# Patient Record
Sex: Female | Born: 1940 | Race: Black or African American | Hispanic: No | Marital: Single | Smoking: Never smoker
Health system: Southern US, Community
[De-identification: ages and names within clinical notes are randomized; demographics above are authoritative.]

## PROBLEM LIST (undated history)

## (undated) DIAGNOSIS — I499 Cardiac arrhythmia, unspecified: Secondary | ICD-10-CM

## (undated) DIAGNOSIS — K219 Gastro-esophageal reflux disease without esophagitis: Secondary | ICD-10-CM

## (undated) DIAGNOSIS — M1990 Unspecified osteoarthritis, unspecified site: Secondary | ICD-10-CM

## (undated) DIAGNOSIS — C7951 Secondary malignant neoplasm of bone: Secondary | ICD-10-CM

## (undated) DIAGNOSIS — I10 Essential (primary) hypertension: Secondary | ICD-10-CM

## (undated) HISTORY — PX: JOINT REPLACEMENT: SHX530

## (undated) HISTORY — PX: ABDOMINAL HYSTERECTOMY: SHX81

---

## 2004-12-11 ENCOUNTER — Inpatient Hospital Stay: Payer: Self-pay | Admitting: Orthopaedic Surgery

## 2005-07-26 ENCOUNTER — Ambulatory Visit: Payer: Self-pay

## 2006-09-04 ENCOUNTER — Ambulatory Visit: Payer: Self-pay

## 2007-09-07 ENCOUNTER — Ambulatory Visit: Payer: Self-pay

## 2008-09-08 ENCOUNTER — Ambulatory Visit: Payer: Self-pay

## 2009-09-14 ENCOUNTER — Ambulatory Visit: Payer: Self-pay

## 2009-10-25 ENCOUNTER — Ambulatory Visit: Payer: Self-pay | Admitting: General Practice

## 2009-11-08 ENCOUNTER — Inpatient Hospital Stay: Payer: Self-pay | Admitting: General Practice

## 2010-10-02 ENCOUNTER — Ambulatory Visit: Payer: Self-pay

## 2011-01-01 ENCOUNTER — Ambulatory Visit: Payer: Self-pay | Admitting: Gastroenterology

## 2011-01-01 DIAGNOSIS — K5790 Diverticulosis of intestine, part unspecified, without perforation or abscess without bleeding: Secondary | ICD-10-CM | POA: Insufficient documentation

## 2011-01-01 DIAGNOSIS — K635 Polyp of colon: Secondary | ICD-10-CM | POA: Insufficient documentation

## 2011-01-01 DIAGNOSIS — K219 Gastro-esophageal reflux disease without esophagitis: Secondary | ICD-10-CM | POA: Insufficient documentation

## 2011-10-07 ENCOUNTER — Ambulatory Visit: Payer: Self-pay

## 2011-11-28 LAB — URINALYSIS, COMPLETE
Blood: NEGATIVE
Ketone: NEGATIVE
Nitrite: POSITIVE
Ph: 6 (ref 4.5–8.0)
Protein: NEGATIVE
Specific Gravity: 1.01 (ref 1.003–1.030)

## 2011-11-28 LAB — CBC
HCT: 35.1 % (ref 35.0–47.0)
HGB: 11.6 g/dL — ABNORMAL LOW (ref 12.0–16.0)
MCH: 30 pg (ref 26.0–34.0)
MCV: 90 fL (ref 80–100)
Platelet: 188 10*3/uL (ref 150–440)
RBC: 3.88 10*6/uL (ref 3.80–5.20)
WBC: 5.9 10*3/uL (ref 3.6–11.0)

## 2011-11-28 LAB — TROPONIN I: Troponin-I: <0.02 ng/mL

## 2011-11-28 LAB — COMPREHENSIVE METABOLIC PANEL
Albumin: 3.4 g/dL (ref 3.4–5.0)
Anion Gap: 10 (ref 7–16)
Calcium, Total: 8.9 mg/dL (ref 8.5–10.1)
Chloride: 105 mmol/L (ref 98–107)
Co2: 29 mmol/L (ref 21–32)
EGFR (African American): >60
Glucose: 93 mg/dL (ref 65–99)
Osmolality: 290 (ref 275–301)
Potassium: 3.5 mmol/L (ref 3.5–5.1)
SGOT(AST): 25 U/L (ref 15–37)
Sodium: 144 mmol/L (ref 136–145)

## 2011-11-28 LAB — CK TOTAL AND CKMB (NOT AT ARMC)
CK, Total: 199 U/L (ref 21–215)
CK-MB: 2.9 ng/mL (ref 0.5–3.6)

## 2011-11-29 ENCOUNTER — Inpatient Hospital Stay: Payer: Self-pay | Admitting: Student

## 2011-11-29 LAB — CK TOTAL AND CKMB (NOT AT ARMC)
CK, Total: 199 U/L (ref 21–215)
CK-MB: 2.1 ng/mL (ref 0.5–3.6)
CK-MB: 1.5 ng/mL (ref 0.5–3.6)

## 2011-11-30 ENCOUNTER — Inpatient Hospital Stay (HOSPITAL_COMMUNITY)
Admission: AD | Admit: 2011-11-30 | Discharge: 2011-12-05 | DRG: 471 | Disposition: A | Discharge status: Home Health | Payer: Medicare Other | Source: Other Acute Inpatient Hospital | Attending: Neurosurgery | Admitting: Neurosurgery

## 2011-11-30 ENCOUNTER — Encounter (HOSPITAL_COMMUNITY): Payer: Self-pay

## 2011-11-30 DIAGNOSIS — G8250 Quadriplegia, unspecified: Secondary | ICD-10-CM | POA: Diagnosis present

## 2011-11-30 DIAGNOSIS — E7800 Pure hypercholesterolemia, unspecified: Secondary | ICD-10-CM | POA: Diagnosis present

## 2011-11-30 DIAGNOSIS — I10 Essential (primary) hypertension: Secondary | ICD-10-CM | POA: Diagnosis present

## 2011-11-30 DIAGNOSIS — M5000 Cervical disc disorder with myelopathy, unspecified cervical region: Principal | ICD-10-CM | POA: Diagnosis present

## 2011-11-30 DIAGNOSIS — K219 Gastro-esophageal reflux disease without esophagitis: Secondary | ICD-10-CM | POA: Diagnosis present

## 2011-11-30 DIAGNOSIS — G959 Disease of spinal cord, unspecified: Secondary | ICD-10-CM | POA: Diagnosis present

## 2011-11-30 HISTORY — DX: Essential (primary) hypertension: I10

## 2011-11-30 HISTORY — DX: Gastro-esophageal reflux disease without esophagitis: K21.9

## 2011-11-30 HISTORY — DX: Cardiac arrhythmia, unspecified: I49.9

## 2011-11-30 HISTORY — DX: Unspecified osteoarthritis, unspecified site: M19.90

## 2011-11-30 LAB — CBC WITH DIFFERENTIAL/PLATELET
Basophil #: 0 10*3/uL (ref 0.0–0.1)
Basophil %: 0.3 %
Eosinophil #: 0.1 10*3/uL (ref 0.0–0.7)
HCT: 33 % — ABNORMAL LOW (ref 35.0–47.0)
HGB: 11 g/dL — ABNORMAL LOW (ref 12.0–16.0)
Lymphocyte #: 2.7 10*3/uL (ref 1.0–3.6)
Lymphocyte %: 60 %
MCHC: 33.2 g/dL (ref 32.0–36.0)
Monocyte #: 0.5 10*3/uL (ref 0.0–0.7)
Monocyte %: 10.8 %
Neutrophil #: 1.2 10*3/uL — ABNORMAL LOW (ref 1.4–6.5)
Neutrophil %: 27 %
WBC: 4.5 10*3/uL (ref 3.6–11.0)

## 2011-11-30 LAB — MAGNESIUM: Magnesium: 2.2 mg/dL

## 2011-11-30 LAB — LIPID PANEL
Cholesterol: 172 mg/dL (ref 0–200)
Ldl Cholesterol, Calc: 107 mg/dL — ABNORMAL HIGH (ref 0–100)
Triglycerides: 118 mg/dL (ref 0–200)
VLDL Cholesterol, Calc: 24 mg/dL (ref 5–40)

## 2011-11-30 LAB — PROTIME-INR: INR: 1

## 2011-11-30 LAB — BASIC METABOLIC PANEL
Anion Gap: 10 (ref 7–16)
BUN: 19 mg/dL — ABNORMAL HIGH (ref 7–18)
Calcium, Total: 8.6 mg/dL (ref 8.5–10.1)
EGFR (African American): >60
EGFR (Non-African Amer.): 59 — ABNORMAL LOW
Glucose: 102 mg/dL — ABNORMAL HIGH (ref 65–99)
Osmolality: 289 (ref 275–301)

## 2011-11-30 MED ORDER — SIMVASTATIN 40 MG PO TABS
40.0000 mg | ORAL_TABLET | Freq: Every evening | ORAL | Status: DC
  Administered 2011-12-01 – 2011-12-04 (×4): 40 mg via ORAL
  Filled 2011-11-30 (×6): qty 1

## 2011-11-30 MED ORDER — SODIUM CHLORIDE 0.9 % IJ SOLN
3.0000 mL | Freq: Two times a day (BID) | INTRAMUSCULAR | Status: DC
  Administered 2011-11-30 – 2011-12-04 (×5): 3 mL via INTRAVENOUS

## 2011-11-30 MED ORDER — DEXAMETHASONE SODIUM PHOSPHATE 4 MG/ML IJ SOLN
4.0000 mg | Freq: Three times a day (TID) | INTRAMUSCULAR | Status: DC
  Administered 2011-11-30 – 2011-12-02 (×7): 4 mg via INTRAVENOUS
  Filled 2011-11-30 (×9): qty 1

## 2011-11-30 MED ORDER — PANTOPRAZOLE SODIUM 40 MG PO TBEC
40.0000 mg | DELAYED_RELEASE_TABLET | Freq: Every day | ORAL | Status: DC
  Administered 2011-11-30 – 2011-12-02 (×3): 40 mg via ORAL
  Filled 2011-11-30 (×3): qty 1

## 2011-11-30 MED ORDER — DOCUSATE SODIUM 100 MG PO CAPS
100.0000 mg | ORAL_CAPSULE | Freq: Two times a day (BID) | ORAL | Status: DC
  Administered 2011-11-30 – 2011-12-02 (×4): 100 mg via ORAL
  Filled 2011-11-30 (×4): qty 1

## 2011-11-30 MED ORDER — SODIUM CHLORIDE 0.9 % IV SOLN: 250.0000 mL | INTRAVENOUS | Status: DC | PRN

## 2011-11-30 MED ORDER — BISACODYL 10 MG RE SUPP: 10.0000 mg | Freq: Every day | RECTAL | Status: DC | PRN

## 2011-11-30 MED ORDER — ALUM & MAG HYDROXIDE-SIMETH 200-200-20 MG/5ML PO SUSP: 30.0000 mL | Freq: Four times a day (QID) | ORAL | Status: DC | PRN

## 2011-11-30 MED ORDER — MAGNESIUM HYDROXIDE 400 MG/5ML PO SUSP: 30.0000 mL | Freq: Every day | ORAL | Status: DC | PRN

## 2011-11-30 MED ORDER — HYDROXYZINE HCL 50 MG/ML IM SOLN
50.0000 mg | INTRAMUSCULAR | Status: DC | PRN
  Filled 2011-11-30: qty 1

## 2011-11-30 MED ORDER — METOPROLOL TARTRATE 50 MG PO TABS
50.0000 mg | ORAL_TABLET | Freq: Two times a day (BID) | ORAL | Status: DC
  Administered 2011-11-30 – 2011-12-04 (×9): 50 mg via ORAL
  Filled 2011-11-30 (×11): qty 1

## 2011-11-30 MED ORDER — HYDROXYZINE HCL 25 MG PO TABS
25.0000 mg | ORAL_TABLET | ORAL | Status: DC | PRN
  Filled 2011-11-30: qty 2

## 2011-11-30 MED ORDER — SODIUM CHLORIDE 0.9 % IJ SOLN: 3.0000 mL | INTRAMUSCULAR | Status: DC | PRN

## 2011-11-30 MED ORDER — HYDROCHLOROTHIAZIDE 25 MG PO TABS
25.0000 mg | ORAL_TABLET | Freq: Every day | ORAL | Status: DC
  Administered 2011-12-01 – 2011-12-05 (×5): 25 mg via ORAL
  Filled 2011-11-30 (×6): qty 1

## 2011-11-30 NOTE — H&P (Signed)
Subjective: Patient is a 71 y.o. 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   Patient is transferred from [facility] where she was hospitalized 2 days ago because of progressive weakness of her upper and lower extremities. Transfer was requested by [facility], a hospitalist at [facility]. Her primary physician is Dr. Loma Sender in [place]. An extensive workup was performed during her 2 day hospitalization including CT of the head which was unremarkable, MRI of the brain which was unremarkable, carotid Doppler ultrasonography which showed no hemodynamically significant stenosis bilaterally and antegrade flow in the vertebral arteries, CT scan of the cervical spine which showed multilevel spondylosis and degenerative disc disease, but most significantly MRI of the cervical spine which showed multilevel spondylosis and degenerative disc disease seen at the C3-4, C4-5, C5-6, and C6-7 levels.  However the most significant findings are at the C3-4 level with there is a significant spondylitic disc herniation causing spinal cord compression, the spinal cord has significant increased signal within it. Because of these findings transfer for neurosurgical care was requested.  Patient describes a history with her first symptoms and difficulties about 2 months ago. She describes initially she had some stiffness and discomfort in her neck. She saw Dedra Skeens, the physician assistant at the orthopedic department at [facility]. She explains that he did x-rays and told her she had arthritis and prescribe some arthritis pills which she felt helped and the stiffness is improved.  She's had increased difficulties over the past 2 weeks. She describes weakness in her legs, that she can't walk well, and she's had a tendency to fall, and for her legs to  give away. However she notes that she's had no pain. She started though using a single-point cane again (which he had from having undergone previous knee replacements) to help her with stability and to reduce the risk of falling.  She went to see her primary physician Dr. Vear Clock on December 27 she returned again to him on January 3 at which time he sent her to the emergency room and she was admitted and underwent a workup as described above.  Past medical history: Patient reports a history of hypertension, hypercholesterolemia, gastroesophageal reflux disease (GERD) and tachycardia arrhythmia. She denies any history of myocardial infarction, cancer, stroke, peptic ulcer disease, diabetes mellitus, or lung disease.  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 Ernest Pine. Many years ago she underwent a hysterectomy.  Prescriptions prior to admission  Medication Sig Dispense Refill  . dexamethasone (DECADRON) 10 MG/ML injection Inject 10 mg into the vein every 6 (six) hours as needed. For inflammation.       . hydrochlorothiazide (HYDRODIURIL) 25 MG tablet Take 25 mg by mouth daily.        . meloxicam (MOBIC) 7.5 MG tablet Take 7.5 mg by mouth daily.        . metoprolol (LOPRESSOR) 50 MG tablet Take 50 mg by mouth 2 (two) times daily.        Marland Kitchen omeprazole (PRILOSEC) 20 MG capsule Take 20 mg by mouth daily.        . simvastatin (ZOCOR) 40 MG tablet Take 40 mg by mouth every evening.         Allergies  Allergen Reactions  . Tylenol (Acetaminophen) Other (See Comments)  Shakes.     History  Substance Use Topics  . Smoking status: Not on file  . Smokeless tobacco: Not on file  . Alcohol Use: Not on file    Family history: Patient's mother is 27 she has hypertension and hypercholesterolemia. Her father died in his 35s and they're unsure of the cause  Social history: Patient is retired she is  unmarried she has 2 sons one of whom, Trey Paula was present with his wife Britta Mccreedy. She doesn't smoke nor does she drink up to hold beverages.  Review of Systems: Patient denies any numbness or paresthesias she denies any bowel or bladder incontinence she denies any complaints regarding her breathing, any chest discomfort, any nausea vomiting diarrhea or constipation, or any urinary discomfort. Review of systems is essentially notable for those difficulties as described in history of present illness.   Objective: Vital signs in last 24 hours: Temp:  [98.4 F (36.9 C)] 98.4 F (36.9 C) (01/05 1600) Pulse Rate:  [69] 69  (01/05 1600) Resp:  [18] 18  (01/05 1600) BP: (147)/(74) 147/74 mmHg (01/05 1600) Weight:  [104.3 kg (229 lb 15 oz)] 229 lb 15 oz (104.3 kg) (01/05 1600)  EXAM: Patient is obese black female in no acute distress. Lungs are clear to auscultation she has symmetrical respiratory excursion. Heart has a regular rate and rhythm normal S1 and S2 there is no murmur. Abdomen is soft nontender nondistended there is no hepatosplenomegaly, bowel sounds are present in all 4 quadrants. Musculoskeletal exam shows no tenderness the patient over the cervical spinous processes or paracervical musculature, she is fairly good range of motion of the neck.  Neurological examination: Motor examination shows quadriparesis: Deltoids are 4+ bilaterally, biceps are 4 minus bilaterally, triceps are 4 bilaterally,Intrinsics are 4 bilaterally, grips are 4 bilaterally. Iliopsoas is 4 minus on the left and 3-4 minus on the right. Quadriceps, dorsiflexor, and plantar flexor are 5 bilaterally. Sensation is intact to pinprick to the upper and lower extremities. Reflexes: The left biceps and brachialis are minimal, the right biceps and brachialis are 1. The left triceps is trace, the right triceps is 1-2. The left quadriceps is 1-2, the right quadriceps is 2. The gastrocnemius are absent bilaterally. The toes are  upgoing bilaterally, right somewhat more vigorously than left. The patient's stance is widened and her gait is widened and wobbly.   Assessment/Plan: 71 year old woman with progressive cervical myelopathy with quadriparesis. 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   I spoke with the patient while she was accompanied by her mother, her son Trey Paula and her daughter-in-law Britta Mccreedy and her close friend Harriett Sine about the nature of her condition and her recommendations for treatment and care. I did to offer them a picture of the nature of her spinal cord compression.  We have discussed that left untreated this condition is very likely to progress with worsening paralysis.  I therefore favor surgical decompression via a single level C3-4 anterior cervical decompression and arthrodesis with allograft and cervical plating. I've discussed with the patient the nature of her condition, the nature the surgical procedure, the typical length of surgery, hospital stay, and overall recuperation. We discussed limitations postoperatively. I discussed risks of surgery including risks of infection, bleeding, possibly need for transfusion, the risk of nerve root dysfunction with pain, weakness, numbness, or paresthesias, the risk of  spinal cord dysfunction with paralysis of all 4 limbs and quadriplegia, and the risk of dural tear and CSF leakage and possible need for further surgery, the risk of esophageal dysfunction causing dysphagia and the risk of laryngeal dysfunction causing hoarseness of the voice, the risk of failure of the arthrodesis and the possible need for further surgery, and the risk of anesthetic complications including myocardial infarction, stroke, pneumonia, and death. We also discussed the need for postoperative  immobilization in a cervical collar. Understanding all this the patient does wish to proceed with surgery.  The patient was given Lovenox as well as Plavix while at a [facility] and therefore we will need to wait several days for the effects of those medications to clear before proceeding with surgery. However in the meantime we'll proceed with a preoperative workup including laboratories, cervical spine x-rays, EKG, and chest x-ray.  Hewitt Shorts, MD 11/30/2011 6:10 PM

## 2011-12-01 ENCOUNTER — Other Ambulatory Visit: Payer: Self-pay

## 2011-12-01 ENCOUNTER — Inpatient Hospital Stay (HOSPITAL_COMMUNITY): Payer: Medicare Other

## 2011-12-01 LAB — COMPREHENSIVE METABOLIC PANEL
ALT: 21 U/L (ref 0–35)
AST: 22 U/L (ref 0–37)
Albumin: 3.1 g/dL — ABNORMAL LOW (ref 3.5–5.2)
Alkaline Phosphatase: 76 U/L (ref 39–117)
BUN: 24 mg/dL — ABNORMAL HIGH (ref 6–23)
CO2: 25 mEq/L (ref 19–32)
Calcium: 9.6 mg/dL (ref 8.4–10.5)
Chloride: 104 mEq/L (ref 96–112)
Creatinine, Ser: 0.9 mg/dL (ref 0.50–1.10)
GFR calc Af Amer: 72 mL/min — ABNORMAL LOW (ref >90)
GFR calc non Af Amer: 62 mL/min — ABNORMAL LOW (ref >90)
Glucose, Bld: 138 mg/dL — ABNORMAL HIGH (ref 70–99)
Potassium: 3.5 mEq/L (ref 3.5–5.1)
Sodium: 141 mEq/L (ref 135–145)
Total Bilirubin: 0.2 mg/dL — ABNORMAL LOW (ref 0.3–1.2)
Total Protein: 6.9 g/dL (ref 6.0–8.3)

## 2011-12-01 LAB — CBC
HCT: 34.1 % — ABNORMAL LOW (ref 36.0–46.0)
Hemoglobin: 11.3 g/dL — ABNORMAL LOW (ref 12.0–15.0)
MCH: 29.4 pg (ref 26.0–34.0)
MCHC: 33.1 g/dL (ref 30.0–36.0)
MCV: 88.8 fL (ref 78.0–100.0)
Platelets: 213 10*3/uL (ref 150–400)
RBC: 3.84 MIL/uL — ABNORMAL LOW (ref 3.87–5.11)
RDW: 13.4 % (ref 11.5–15.5)
WBC: 4.4 10*3/uL (ref 4.0–10.5)

## 2011-12-01 LAB — PROTIME-INR
INR: 1 (ref 0.00–1.49)
Prothrombin Time: 13.4 seconds (ref 11.6–15.2)

## 2011-12-01 LAB — DIFFERENTIAL
Basophils Absolute: 0 10*3/uL (ref 0.0–0.1)
Basophils Relative: 0 % (ref 0–1)
Eosinophils Absolute: 0 10*3/uL (ref 0.0–0.7)
Eosinophils Relative: 0 % (ref 0–5)
Lymphocytes Relative: 34 % (ref 12–46)
Lymphs Abs: 1.5 10*3/uL (ref 0.7–4.0)
Monocytes Absolute: 0.2 10*3/uL (ref 0.1–1.0)
Monocytes Relative: 5 % (ref 3–12)
Neutro Abs: 2.7 10*3/uL (ref 1.7–7.7)
Neutrophils Relative %: 62 % (ref 43–77)

## 2011-12-01 LAB — APTT: aPTT: 26 seconds (ref 24–37)

## 2011-12-02 ENCOUNTER — Encounter (HOSPITAL_COMMUNITY)
Admission: AD | Disposition: A | Discharge status: Home Health | Payer: Self-pay | Source: Other Acute Inpatient Hospital | Attending: Neurosurgery

## 2011-12-02 ENCOUNTER — Inpatient Hospital Stay (HOSPITAL_COMMUNITY): Payer: Medicare Other | Admitting: Anesthesiology

## 2011-12-02 ENCOUNTER — Encounter (HOSPITAL_COMMUNITY): Payer: Self-pay | Admitting: Anesthesiology

## 2011-12-02 ENCOUNTER — Inpatient Hospital Stay (HOSPITAL_COMMUNITY): Payer: Medicare Other

## 2011-12-02 HISTORY — PX: ANTERIOR CERVICAL DECOMP/DISCECTOMY FUSION: SHX1161

## 2011-12-02 SURGERY — ANTERIOR CERVICAL DECOMPRESSION/DISCECTOMY FUSION 1 LEVEL
Anesthesia: General | Site: Neck | Wound class: Clean

## 2011-12-02 MED ORDER — HYDROXYZINE HCL 50 MG/ML IM SOLN
50.0000 mg | INTRAMUSCULAR | Status: DC | PRN
  Filled 2011-12-02: qty 1

## 2011-12-02 MED ORDER — SODIUM CHLORIDE 0.9 % IR SOLN
Status: DC | PRN
  Administered 2011-12-02: 15:00:00

## 2011-12-02 MED ORDER — ONDANSETRON HCL 4 MG/2ML IJ SOLN
INTRAMUSCULAR | Status: DC | PRN
  Administered 2011-12-02: 4 mg via INTRAVENOUS

## 2011-12-02 MED ORDER — CEFAZOLIN SODIUM-DEXTROSE 2-3 GM-% IV SOLR
INTRAVENOUS | Status: AC
  Administered 2011-12-02: 2 g via INTRAVENOUS
  Filled 2011-12-02: qty 50

## 2011-12-02 MED ORDER — DEXAMETHASONE 4 MG PO TABS
4.0000 mg | ORAL_TABLET | Freq: Four times a day (QID) | ORAL | Status: DC
  Administered 2011-12-02 – 2011-12-03 (×2): 4 mg via ORAL
  Filled 2011-12-02 (×7): qty 1

## 2011-12-02 MED ORDER — ROCURONIUM BROMIDE 100 MG/10ML IV SOLN
INTRAVENOUS | Status: DC | PRN
  Administered 2011-12-02: 50 mg via INTRAVENOUS

## 2011-12-02 MED ORDER — OXYCODONE HCL 5 MG PO TABS
5.0000 mg | ORAL_TABLET | ORAL | Status: DC | PRN
  Administered 2011-12-03 – 2011-12-04 (×4): 10 mg via ORAL
  Filled 2011-12-02 (×4): qty 2

## 2011-12-02 MED ORDER — DOCUSATE SODIUM 100 MG PO CAPS
100.0000 mg | ORAL_CAPSULE | Freq: Two times a day (BID) | ORAL | Status: DC
  Administered 2011-12-02 – 2011-12-05 (×6): 100 mg via ORAL
  Filled 2011-12-02 (×5): qty 1

## 2011-12-02 MED ORDER — GLYCOPYRROLATE 0.2 MG/ML IJ SOLN
INTRAMUSCULAR | Status: DC | PRN
  Administered 2011-12-02: .6 mg via INTRAVENOUS

## 2011-12-02 MED ORDER — PROPOFOL 10 MG/ML IV EMUL
INTRAVENOUS | Status: DC | PRN
  Administered 2011-12-02: 200 mg via INTRAVENOUS

## 2011-12-02 MED ORDER — DEXAMETHASONE SODIUM PHOSPHATE 4 MG/ML IJ SOLN
4.0000 mg | Freq: Four times a day (QID) | INTRAMUSCULAR | Status: DC
  Administered 2011-12-02: 4 mg via INTRAVENOUS
  Filled 2011-12-02 (×3): qty 1

## 2011-12-02 MED ORDER — KETOROLAC TROMETHAMINE 30 MG/ML IJ SOLN
15.0000 mg | Freq: Once | INTRAMUSCULAR | Status: AC
  Administered 2011-12-02: 15 mg via INTRAVENOUS

## 2011-12-02 MED ORDER — PANTOPRAZOLE SODIUM 40 MG IV SOLR
40.0000 mg | Freq: Every day | INTRAVENOUS | Status: DC
  Administered 2011-12-02: 40 mg via INTRAVENOUS
  Filled 2011-12-02 (×3): qty 40

## 2011-12-02 MED ORDER — MENTHOL 3 MG MT LOZG: 1.0000 | LOZENGE | OROMUCOSAL | Status: DC | PRN

## 2011-12-02 MED ORDER — CEFAZOLIN SODIUM-DEXTROSE 2-3 GM-% IV SOLR
2.0000 g | Freq: Once | INTRAVENOUS | Status: DC
  Filled 2011-12-02: qty 50

## 2011-12-02 MED ORDER — BUPIVACAINE HCL (PF) 0.25 % IJ SOLN
INTRAMUSCULAR | Status: DC | PRN
  Administered 2011-12-02: 9 mL

## 2011-12-02 MED ORDER — FENTANYL CITRATE 0.05 MG/ML IJ SOLN
INTRAMUSCULAR | Status: DC | PRN
  Administered 2011-12-02 (×2): 50 ug via INTRAVENOUS
  Administered 2011-12-02: 100 ug via INTRAVENOUS
  Administered 2011-12-02: 50 ug via INTRAVENOUS

## 2011-12-02 MED ORDER — ALUM & MAG HYDROXIDE-SIMETH 200-200-20 MG/5ML PO SUSP: 30.0000 mL | Freq: Four times a day (QID) | ORAL | Status: DC | PRN

## 2011-12-02 MED ORDER — HYDROXYZINE HCL 50 MG PO TABS
50.0000 mg | ORAL_TABLET | ORAL | Status: DC | PRN
  Filled 2011-12-02: qty 1

## 2011-12-02 MED ORDER — SODIUM CHLORIDE 0.9 % IV SOLN: 250.0000 mL | INTRAVENOUS | Status: DC

## 2011-12-02 MED ORDER — KCL IN DEXTROSE-NACL 40-5-0.45 MEQ/L-%-% IV SOLN
INTRAVENOUS | Status: DC
  Administered 2011-12-02 – 2011-12-03 (×2): via INTRAVENOUS
  Filled 2011-12-02 (×9): qty 1000

## 2011-12-02 MED ORDER — KETOROLAC TROMETHAMINE 30 MG/ML IJ SOLN
INTRAMUSCULAR | Status: AC
  Filled 2011-12-02: qty 1

## 2011-12-02 MED ORDER — CYCLOBENZAPRINE HCL 10 MG PO TABS: 10.0000 mg | ORAL_TABLET | Freq: Three times a day (TID) | ORAL | Status: DC | PRN

## 2011-12-02 MED ORDER — VECURONIUM BROMIDE 10 MG IV SOLR
INTRAVENOUS | Status: DC | PRN
  Administered 2011-12-02: 1 mg via INTRAVENOUS

## 2011-12-02 MED ORDER — 0.9 % SODIUM CHLORIDE (POUR BTL) OPTIME
TOPICAL | Status: DC | PRN
  Administered 2011-12-02: 1000 mL

## 2011-12-02 MED ORDER — MORPHINE SULFATE 4 MG/ML IJ SOLN: 4.0000 mg | INTRAMUSCULAR | Status: DC | PRN

## 2011-12-02 MED ORDER — KETOROLAC TROMETHAMINE 30 MG/ML IJ SOLN
15.0000 mg | Freq: Four times a day (QID) | INTRAMUSCULAR | Status: DC
  Administered 2011-12-02 – 2011-12-04 (×6): 15 mg via INTRAVENOUS
  Filled 2011-12-02 (×8): qty 1

## 2011-12-02 MED ORDER — THROMBIN 5000 UNITS EX SOLR
CUTANEOUS | Status: DC | PRN
  Administered 2011-12-02 (×2): 5000 [IU] via TOPICAL

## 2011-12-02 MED ORDER — SODIUM CHLORIDE 0.9 % IJ SOLN
3.0000 mL | Freq: Two times a day (BID) | INTRAMUSCULAR | Status: DC
  Administered 2011-12-03 – 2011-12-04 (×3): 3 mL via INTRAVENOUS

## 2011-12-02 MED ORDER — BISACODYL 10 MG RE SUPP: 10.0000 mg | Freq: Every day | RECTAL | Status: DC | PRN

## 2011-12-02 MED ORDER — BACITRACIN 50000 UNITS IM SOLR
INTRAMUSCULAR | Status: AC
  Filled 2011-12-02: qty 50000

## 2011-12-02 MED ORDER — ONDANSETRON HCL 4 MG/2ML IJ SOLN: 4.0000 mg | Freq: Four times a day (QID) | INTRAMUSCULAR | Status: DC | PRN

## 2011-12-02 MED ORDER — PHENOL 1.4 % MT LIQD: 1.0000 | OROMUCOSAL | Status: DC | PRN

## 2011-12-02 MED ORDER — LIDOCAINE-EPINEPHRINE 1 %-1:100000 IJ SOLN
INTRAMUSCULAR | Status: DC | PRN
  Administered 2011-12-02: 9 mL

## 2011-12-02 MED ORDER — SODIUM CHLORIDE 0.9 % IV SOLN
INTRAVENOUS | Status: AC
  Filled 2011-12-02: qty 500

## 2011-12-02 MED ORDER — SODIUM CHLORIDE 0.9 % IJ SOLN: 3.0000 mL | INTRAMUSCULAR | Status: DC | PRN

## 2011-12-02 MED ORDER — DEXAMETHASONE SODIUM PHOSPHATE 4 MG/ML IJ SOLN
INTRAMUSCULAR | Status: DC | PRN
  Administered 2011-12-02: 4 mg via INTRAVENOUS

## 2011-12-02 MED ORDER — ZOLPIDEM TARTRATE 5 MG PO TABS: 5.0000 mg | ORAL_TABLET | Freq: Every evening | ORAL | Status: DC | PRN

## 2011-12-02 MED ORDER — ACETAMINOPHEN 325 MG PO TABS: 650.0000 mg | ORAL_TABLET | ORAL | Status: DC | PRN

## 2011-12-02 MED ORDER — HYDROMORPHONE HCL PF 1 MG/ML IJ SOLN: 0.2500 mg | INTRAMUSCULAR | Status: DC | PRN

## 2011-12-02 MED ORDER — NEOSTIGMINE METHYLSULFATE 1 MG/ML IJ SOLN
INTRAMUSCULAR | Status: DC | PRN
  Administered 2011-12-02: 4 mg via INTRAVENOUS

## 2011-12-02 MED ORDER — ACETAMINOPHEN 650 MG RE SUPP: 650.0000 mg | RECTAL | Status: DC | PRN

## 2011-12-02 MED ORDER — HEMOSTATIC AGENTS (NO CHARGE) OPTIME
TOPICAL | Status: DC | PRN
  Administered 2011-12-02: 1 via TOPICAL

## 2011-12-02 MED ORDER — LACTATED RINGERS IV SOLN
INTRAVENOUS | Status: DC | PRN
  Administered 2011-12-02: 14:00:00 via INTRAVENOUS

## 2011-12-02 MED ORDER — MAGNESIUM HYDROXIDE 400 MG/5ML PO SUSP: 30.0000 mL | Freq: Every day | ORAL | Status: DC | PRN

## 2011-12-02 SURGICAL SUPPLY — 54 items
BAG DECANTER FOR FLEXI CONT (MISCELLANEOUS) ×2 IMPLANT
BIT DRILL NEURO 2X3.1 SFT TUCH (MISCELLANEOUS) ×1 IMPLANT
BLADE ULTRA TIP 2M (BLADE) ×2 IMPLANT
BRUSH SCRUB EZ PLAIN DRY (MISCELLANEOUS) ×2 IMPLANT
CANISTER SUCTION 2500CC (MISCELLANEOUS) ×2 IMPLANT
CLOTH BEACON ORANGE TIMEOUT ST (SAFETY) ×2 IMPLANT
CONT SPEC 4OZ CLIKSEAL STRL BL (MISCELLANEOUS) ×2 IMPLANT
COVER MAYO STAND STRL (DRAPES) ×2 IMPLANT
DECANTER SPIKE VIAL GLASS SM (MISCELLANEOUS) ×2 IMPLANT
DERMABOND ADVANCED (GAUZE/BANDAGES/DRESSINGS) ×1
DERMABOND ADVANCED .7 DNX12 (GAUZE/BANDAGES/DRESSINGS) ×1 IMPLANT
DRAPE LAPAROTOMY 100X72 PEDS (DRAPES) ×2 IMPLANT
DRAPE MICROSCOPE LEICA (MISCELLANEOUS) ×2 IMPLANT
DRAPE POUCH INSTRU U-SHP 10X18 (DRAPES) ×2 IMPLANT
DRAPE PROXIMA HALF (DRAPES) IMPLANT
DRILL NEURO 2X3.1 SOFT TOUCH (MISCELLANEOUS) ×2
ELECT COATED BLADE 2.86 ST (ELECTRODE) ×2 IMPLANT
ELECT REM PT RETURN 9FT ADLT (ELECTROSURGICAL) ×2
ELECTRODE REM PT RTRN 9FT ADLT (ELECTROSURGICAL) ×1 IMPLANT
GLOVE BIOGEL PI IND STRL 8 (GLOVE) ×1 IMPLANT
GLOVE BIOGEL PI IND STRL 8.5 (GLOVE) ×1 IMPLANT
GLOVE BIOGEL PI INDICATOR 8 (GLOVE) ×1
GLOVE BIOGEL PI INDICATOR 8.5 (GLOVE) ×1
GLOVE ECLIPSE 7.5 STRL STRAW (GLOVE) ×8 IMPLANT
GLOVE ECLIPSE 8.5 STRL (GLOVE) ×2 IMPLANT
GLOVE EXAM NITRILE LRG STRL (GLOVE) IMPLANT
GLOVE EXAM NITRILE MD LF STRL (GLOVE) IMPLANT
GLOVE EXAM NITRILE XL STR (GLOVE) IMPLANT
GLOVE EXAM NITRILE XS STR PU (GLOVE) IMPLANT
GLOVE INDICATOR 7.5 STRL GRN (GLOVE) ×2 IMPLANT
GOWN BRE IMP SLV AUR LG STRL (GOWN DISPOSABLE) ×2 IMPLANT
GOWN BRE IMP SLV AUR XL STRL (GOWN DISPOSABLE) ×4 IMPLANT
GOWN STRL REIN 2XL LVL4 (GOWN DISPOSABLE) ×2 IMPLANT
GRAFT CORT CANC 14X8.25X11 5D (Bone Implant) ×2 IMPLANT
HEAD HALTER (SOFTGOODS) ×2 IMPLANT
KIT BASIN OR (CUSTOM PROCEDURE TRAY) ×2 IMPLANT
KIT ROOM TURNOVER OR (KITS) ×2 IMPLANT
NEEDLE HYPO 25X1 1.5 SAFETY (NEEDLE) ×2 IMPLANT
NEEDLE SPNL 22GX3.5 QUINCKE BK (NEEDLE) ×2 IMPLANT
NS IRRIG 1000ML POUR BTL (IV SOLUTION) ×2 IMPLANT
PACK LAMINECTOMY NEURO (CUSTOM PROCEDURE TRAY) ×2 IMPLANT
PAD ARMBOARD 7.5X6 YLW CONV (MISCELLANEOUS) ×6 IMPLANT
PLATE CERVICAL 14MM (Plate) ×2 IMPLANT
RUBBERBAND STERILE (MISCELLANEOUS) ×4 IMPLANT
SCREW FIX 4.0X14MM (Screw) ×4 IMPLANT
SCREW VAR 4.0X14MM (Screw) ×4 IMPLANT
SPONGE INTESTINAL PEANUT (DISPOSABLE) ×2 IMPLANT
SPONGE SURGIFOAM ABS GEL SZ50 (HEMOSTASIS) ×2 IMPLANT
SUT VIC AB 2-0 CP2 18 (SUTURE) ×2 IMPLANT
SUT VIC AB 3-0 SH 8-18 (SUTURE) ×2 IMPLANT
SYR 20ML ECCENTRIC (SYRINGE) ×2 IMPLANT
TOWEL OR 17X24 6PK STRL BLUE (TOWEL DISPOSABLE) IMPLANT
TOWEL OR 17X26 10 PK STRL BLUE (TOWEL DISPOSABLE) ×2 IMPLANT
WATER STERILE IRR 1000ML POUR (IV SOLUTION) ×2 IMPLANT

## 2011-12-02 NOTE — Progress Notes (Signed)
Utilization review completed. Anjelo Pullman, RN, BSN. 12/02/11 

## 2011-12-02 NOTE — Progress Notes (Signed)
Filed Vitals:   12/01/11 1416 12/01/11 2200 12/02/11 0310 12/02/11 0532  BP: 114/70 137/72 113/72 106/66  Pulse: 65 70 66 59  Temp: 98.2 F (36.8 C) 98.3 F (36.8 C) 98.3 F (36.8 C) 97.5 F (36.4 C)  TempSrc: Oral Oral Oral Oral  Resp: 18 20 20 20   Height:      Weight:      SpO2: 93% 97% 96% 100%    CBC  Basename 12/01/11 0540  WBC 4.4  HGB 11.3*  HCT 34.1*  PLT 213   BMET  Basename 12/01/11 0540  NA 141  K 3.5  CL 104  CO2 25  GLUCOSE 138*  BUN 24*  CREATININE 0.90  CALCIUM 9.6    Patient seen and reexamined. She notes improvement in his strength of her upper and lower extremities. She feels that she is more stable when walking. Laboratories yesterday look good but her urinalysis was never done by the nursing staff. EKG and chest x-ray looked good and her cervical spine x-rays were reviewed.  On exam strength is improved in her extremities: Deltoid are 5, biceps and triceps are 4+, intrinsics remain 4 and grips remained 4.   Plan: I've discussed with the patient proceeding with surgery today, we discussed this extensively 2 days ago, specifically a C3-C4 anterior cervical decompression and arthrodesis with bone graft and cervical plating. We discussed and answered her further questions at this time and she does wish to proceed with surgery and orders have been written.

## 2011-12-02 NOTE — Anesthesia Procedure Notes (Addendum)
Procedure Name: Intubation Date/Time: 12/02/2011 2:00 PM Performed by: Ellin Goodie Pre-anesthesia Checklist: Patient identified, Emergency Drugs available, Suction available, Patient being monitored and Timeout performed Patient Re-evaluated:Patient Re-evaluated prior to inductionOxygen Delivery Method: Circle System Utilized Preoxygenation: Pre-oxygenation with 100% oxygen Intubation Type: IV induction Ventilation: Mask ventilation without difficulty Laryngoscope Size: Mac and 4 Grade View: Grade I Tube size: 7.5 mm Number of attempts: 1 Airway Equipment and Method: stylet Secured at: 22 cm Tube secured with: Tape Dental Injury: Teeth and Oropharynx as per pre-operative assessment

## 2011-12-02 NOTE — Transfer of Care (Signed)
Immediate Anesthesia Transfer of Care Note  Patient: Eileen Norris  Procedure(s) Performed:  ANTERIOR CERVICAL DECOMPRESSION/DISCECTOMY FUSION 1 LEVEL - Cervical three - four Anterior Cervical Decompression Fusion  Patient Location: PACU  Anesthesia Type: General  Level of Consciousness: awake and patient cooperative  Airway & Oxygen Therapy: Patient Spontanous Breathing and Patient connected to face mask oxygen  Post-op Assessment: Report given to PACU RN, Post -op Vital signs reviewed and stable and Patient moving all extremities X 4  Post vital signs: Reviewed  Complications: No apparent anesthesia complications

## 2011-12-02 NOTE — Op Note (Signed)
11/30/2011 - 12/02/2011  3:50 PM  PATIENT:  Eileen Norris  71 y.o. female  PRE-OPERATIVE DIAGNOSIS:  C3-4 spondylitic cervical disc herniation with myelopathy and quadriparesis POST-OPERATIVE DIAGNOSIS:  C3-4 spondylitic cervical disc herniation with myelopathy and quadriparesis  PROCEDURE:  Procedure(s): ANTERIOR CERVICAL DECOMPRESSION/DISCECTOMY FUSION 1 LEVEL, C3-4 with allograft and tether cervical plating  SURGEON:  Surgeon(s): Reatha Harps Elsner  ASSISTANTS: Stefani Dama  ANESTHESIA:   general  EBL:  Total I/O In: 800 [I.V.:800] Out: -   BLOOD ADMINISTERED:none  CELL SAVER GIVEN: None  COUNT: Correct per nursing staff  DRAINS: none   SPECIMEN:  No Specimen  DICTATION: Patient was brought to the operating room placed under general endotracheal anesthesia. Patient was placed in 10 pounds of halter traction. The neck was prepped with Betadine soap and solution and draped in a sterile fashion. A horizontal incision was made on the left side of the neck. The line of the incision was infiltrated with local anesthetic with epinephrine. Dissection was carried down thru the subcutaneous tissue and platysma, bipolar cautery was used to maintain hemostasis. Dissection was then carried out thru an avascular plane leaving the sternocleidomastoid carotid artery and jugular vein laterally and the trachea and esophagus medially. The ventral aspect of the vertebral column was identified and a localizing x-ray was taken. The C3-4 level was identified. The annulus was incised and the disc space entered. Discectomy was performed with micro-curettes and pituitary rongeurs. The operating microscope was draped and brought into the field provided additional magnification illumination and visualization. Discectomy was continued posteriorly thru the disc space and then the cartilaginous endplate was removed using micro-curettes along with the high-speed drill. Posterior prosthetic overgrowth  was removed using the high-speed drill along with a 2 mm thin footplate Kerrison punch. Posterior longitudinal ligament along with disc herniation was carefully removed, decompressing the spinal canal and thecal sac. We then continued to remove osteophytic overgrowth and disc material decompressing the neural foramina and exiting nerve roots bilaterally. Once the decompression was completed hemostasis was established with the use of Gelfoam with thrombin and bipolar cautery. The Gelfoam was removed the wound irrigated and hemostasis confirmed. We then measured the height of the intravertebral disc space and selected a 8 millimeter in height structural allograft. It was hydrated and saline solution and then gently positioned in the intravertebral disc space and countersunk. We then selected a 14 millimeter in height Tether cervical plate. It was positioned over the fusion construct and secured to the vertebra with 4 x 14 mm screws, using fixed screws at C4 and variable screws at C3. Each screw hole was started with the high-speed drill and then the screws placed once all the screws were placed final tightening was performed. The wound was irrigated with bacitracin solution checked for hemostasis which was established and confirmed. An x-ray was taken which showed the graft, the plate, and screws all in good position . We then proceeded with closure. The platysma was closed with interrupted inverted 2-0 undyed Vicryl suture, the subcutaneous and subcuticular closed with interrupted inverted 3-0 undyed Vicryl suture. The skin edges were approximated with Dermabond. Following surgery the patient was taken out of cervical traction. To be reversed and the anesthetic and taken to the recovery room for further care.   PLAN OF CARE: Admit to inpatient   PATIENT DISPOSITION:  PACU - hemodynamically stable.   Delay start of Pharmacological VTE agent (>24hrs) due to surgical blood loss or risk of bleeding:  yes

## 2011-12-02 NOTE — Anesthesia Postprocedure Evaluation (Signed)
Anesthesia Post Note  Patient: Eileen Norris  Procedure(s) Performed:  ANTERIOR CERVICAL DECOMPRESSION/DISCECTOMY FUSION 1 LEVEL - Cervical three - four Anterior Cervical Decompression Fusion  Anesthesia type: General  Patient location: PACU  Post pain: Pain level controlled and Adequate analgesia  Post assessment: Post-op Vital signs reviewed, Patient's Cardiovascular Status Stable, Respiratory Function Stable, Patent Airway and Pain level controlled  Last Vitals:  Filed Vitals:   12/02/11 1600  BP:   Pulse:   Temp: 36.4 C  Resp:     Post vital signs: Reviewed and stable  Level of consciousness: awake, alert  and oriented  Complications: No apparent anesthesia complications

## 2011-12-02 NOTE — Progress Notes (Signed)
Filed Vitals:   12/02/11 1630 12/02/11 1700 12/02/11 1705 12/02/11 1726  BP:   136/50 133/74  Pulse: 45 51 51 49  Temp:   97 F (36.1 C) 97.6 F (36.4 C)  TempSrc:    Oral  Resp: 17 17 20 16   Height:      Weight:      SpO2: 100% 100% 100% 91%    CBC  Basename 12/01/11 0540  WBC 4.4  HGB 11.3*  HCT 34.1*  PLT 213   BMET  Basename 12/01/11 0540  NA 141  K 3.5  CL 104  CO2 25  GLUCOSE 138*  BUN 24*  CREATININE 0.90  CALCIUM 9.6    Patient doing well following surgery. Resting comfortably in bed. Has been out of bed to the bathroom with assistance. Has eaten all of her dinner. Has voided.  On exam wound is healing well there is no erythema swelling or drainage, moving all 4 extremities well.   Plan: Encouraged patient to begin ambulation with a staff and family assisting her. To be seen by PT and OT. Continuing Decadron for now, will probably begin taper tomorrow.

## 2011-12-02 NOTE — Anesthesia Preprocedure Evaluation (Addendum)
Anesthesia Evaluation  Patient identified by MRN, date of birth, ID band Patient awake    Reviewed: Allergy & Precautions, H&P , NPO status , Patient's Chart, lab work & pertinent test results  Airway Mallampati: II  Neck ROM: full    Dental   Pulmonary shortness of breath,          Cardiovascular hypertension,     Neuro/Psych    GI/Hepatic GERD-  ,  Endo/Other  Morbid obesity  Renal/GU      Musculoskeletal   Abdominal   Peds  Hematology   Anesthesia Other Findings   Reproductive/Obstetrics                          Anesthesia Physical Anesthesia Plan  ASA: II  Anesthesia Plan: General   Post-op Pain Management:    Induction: Intravenous  Airway Management Planned: Oral ETT  Additional Equipment:   Intra-op Plan:   Post-operative Plan: Extubation in OR  Informed Consent: I have reviewed the patients History and Physical, chart, labs and discussed the procedure including the risks, benefits and alternatives for the proposed anesthesia with the patient or authorized representative who has indicated his/her understanding and acceptance.     Plan Discussed with: CRNA and Surgeon  Anesthesia Plan Comments:         Anesthesia Quick Evaluation

## 2011-12-03 ENCOUNTER — Encounter (HOSPITAL_COMMUNITY): Payer: Self-pay | Admitting: Neurosurgery

## 2011-12-03 MED ORDER — PANTOPRAZOLE SODIUM 40 MG PO TBEC
40.0000 mg | DELAYED_RELEASE_TABLET | Freq: Every day | ORAL | Status: DC
  Administered 2011-12-04 – 2011-12-05 (×3): 40 mg via ORAL
  Filled 2011-12-03 (×3): qty 1

## 2011-12-03 MED ORDER — OXYMETAZOLINE HCL 0.05 % NA SOLN
2.0000 | NASAL | Status: DC | PRN
  Administered 2011-12-04 – 2011-12-05 (×2): 2 via NASAL
  Filled 2011-12-03: qty 15

## 2011-12-03 MED ORDER — DEXAMETHASONE 4 MG PO TABS
4.0000 mg | ORAL_TABLET | Freq: Two times a day (BID) | ORAL | Status: DC
  Administered 2011-12-03 – 2011-12-04 (×2): 4 mg via ORAL
  Filled 2011-12-03 (×4): qty 1

## 2011-12-03 NOTE — Progress Notes (Signed)
Patient C/O running nose with congestion. Dr. Jordan Likes notified and ordered Afrin nasal spray, 2 puffs to each nostril every 2 hrs prn. Order carried out.

## 2011-12-03 NOTE — Progress Notes (Signed)
Filed Vitals:   12/02/11 1726 12/02/11 2100 12/03/11 0300 12/03/11 0500  BP: 133/74 125/65 146/73 136/77  Pulse: 49 53 54 59  Temp: 97.6 F (36.4 C) 97.9 F (36.6 C) 97.8 F (36.6 C) 98.2 F (36.8 C)  TempSrc: Oral Oral Oral Oral  Resp: 16 18 18 18   Height:      Weight:      SpO2: 91% 96% 96% 96%      Patient resting comfortably in bed, she's been up to the bathroom with a staff assistance several times, she does also have a short walking in the hallway. She notes marked improvement in the strength in her upper extremities including her hands as well as in her lower extremities. She denies any pain or discomfort. Her wound is clean and dry.   Plan: I've spoken with her nurse and the patient as well, and have explained to them that she needs to be up and ambulating in the halls at least 4 or 5 times today. Further we've ordered PT and OT to work with her, although with her improvement, the extent of therapy needed maybe limited. Will have the nursing staff change her IV to a saline lock. We'll begin a Decadron taper, reducing the dosage to 4 mg every 12 hours today.

## 2011-12-03 NOTE — Progress Notes (Signed)
Physical Therapy Evaluation Patient Details Name: Eileen Norris MRN: [id number] DOB: 1941-08-06 Today's Date: 12/03/2011  Problem List:  Patient Active Problem List  Diagnoses  . HNP (herniated nucleus pulposus) with myelopathy, cervical    Past Medical History:  Past Medical History  Diagnosis Date  . Shortness of breath   . Hypertension   . GERD (gastroesophageal reflux disease)   . Arthritis     Neck, knees, hands  . Dysrhythmia     "beating fast & flooding"   Past Surgical History:  Past Surgical History  Procedure Date  . Joint replacement     bilateral knees,   . Abdominal hysterectomy     PT Assessment/Plan/Recommendation PT Assessment Clinical Impression Statement: Patient presents S/P ACDF C3-4 due to cervical compression from a herniated disc. She now presents with normal sensation and strength to bilateral LE's, but given recent fall history, weakness and immobility/episode of tripping today with gait it may be advisable for patient to have inital 24 hour care and [facility] PT follow up as muscular endurance appears affected at present.  PT Recommendation/Assessment: Patient will need skilled PT in the acute care venue PT Problem List: Decreased activity tolerance;Decreased mobility;Decreased knowledge of precautions PT Therapy Diagnosis : Difficulty walking PT Plan PT Frequency: Min 5X/week PT Treatment/Interventions: Gait training;Functional mobility training;Stair training;Patient/family education PT Recommendation Follow Up Recommendations: Supervision/Assistance - 24 hour;Home health PT (Safety eval.) Equipment Recommended: None recommended by PT PT Goals  Acute Rehab PT Goals PT Goal Formulation: With patient Time For Goal Achievement: 5 days Pt will Ambulate: >150 feet;with modified independence;with cane PT Goal: Ambulate - Progress: Not met Additional Goals Additional Goal #1: Berg balance score will be at least 44 to represent need for only cane use at  discharge PT Goal: Additional Goal #1 - Progress: Not met  PT Evaluation Precautions/Restrictions  Precautions Precaution Comments: Cervical - educated in posture as tendency to flex. Educatd in cervical precautions. Required Braces or Orthoses: Yes Cervical Brace: Soft collar Restrictions Weight Bearing Restrictions: No Prior Functioning  Home Living Lives With: Alone Receives Help From: Family;Friend(s) Type of Home: Apartment Home Layout: One level Home Access: Stairs to enter Entrance Stairs-Rails: Right Entrance Stairs-Number of Steps: 1 Bathroom Shower/Tub: Engineer, manufacturing systems: Standard Home Adaptive Equipment: Shower chair with back;Raised toilet seat with rails;Walker - four wheeled;Straight cane Prior Function Level of Independence: Independent with basic ADLs;Independent with homemaking with ambulation (Until recent decline secondary to cervical compression) Driving: No Cognition Cognition Arousal/Alertness: Awake/alert Overall Cognitive Status: Appears within functional limits for tasks assessed Orientation Level: Oriented X4 Sensation/Coordination Sensation Light Touch: Appears Intact (for bilateral lower extremities) Coordination Gross Motor Movements are Fluid and Coordinated: Yes Extremity Assessment RLE Assessment RLE Assessment: Within Functional Limits LLE Assessment LLE Assessment: Within Functional Limits Mobility (including Balance) Bed Mobility Bed Mobility: Yes Rolling Right: 7: Independent Right Sidelying to Sit: 7: Independent Transfers Transfers: Yes Sit to Stand: From bed;From chair/3-in-1;7: Independent;With upper extremity assist Stand to Sit: To chair/3-in-1;7: Independent;With upper extremity assist Ambulation/Gait Ambulation/Gait: Yes Ambulation/Gait Assistance: 5: Supervision Ambulation/Gait Assistance Details (indicate cue type and reason): Patient with waddling gait. One episode of decreased foot clearance right lower  extremity in swing phase of gait resulting in tripping - patient able to prevent fall.  Ambulation Distance (Feet): 250 Feet Assistive device: Straight cane Stairs: Yes Stairs Assistance: 7: Independent Stair Management Technique: One rail Right;With cane;Alternating pattern Number of Stairs: 4  Height of Stairs: 6   Dynamic Sitting Balance Dynamic Sitting -  Balance Support: During functional activity Dynamic Sitting - Level of Assistance: 7: Independent Dynamic Sitting - Balance Activities: Lateral lean/weight shifting;Forward lean/weight shifting;Reaching for objects High Level Balance High Level Balance Activites: Turns High Level Balance Comments: 180 degree turns with 4 steps not indicative of falls End of Session PT - End of Session Equipment Utilized During Treatment: Gait belt;Cervical collar Activity Tolerance: Patient tolerated treatment well Patient left: in chair;with call bell in reach Nurse Communication: Mobility status for ambulation General Behavior During Session: [facility] for tasks performed Cognition: [facility] for tasks performed  Edwyna Perfect, PT  Pager [phone]  12/03/2011, 9:10 AM

## 2011-12-03 NOTE — Clinical Documentation Improvement (Signed)
BMI DOCUMENTATION CLARIFICATION QUERY  THIS DOCUMENT IS NOT A PERMANENT PART OF THE MEDICAL RECORD  TO RESPOND TO THE THIS QUERY, FOLLOW THE INSTRUCTIONS BELOW:  1. If needed, update documentation for the patient's encounter via the notes activity.  2. Access this query again and click edit on the In [employer].  3. After updating, or not, click F2 to complete all highlighted (required) fields concerning your review. Select "additional documentation in the medical record" OR "no additional documentation provided".  4. Click Sign note button.  5. The deficiency will fall out of your In Basket *Please let us know if you are not able to complete this workflow by phone or e-mail (listed below).         12/03/11  Dear Dr. Newell Coral Marton Redwood  In an effort to better capture your patient's severity of illness, reflect appropriate length of stay and utilization of resources, a review of the patient medical record has revealed the following indicators.   Based on your clinical judgment, please clarify and document in a progress note and/or discharge summary the clinical condition associated with the following supporting information: In responding to this query please exercise your independent judgment.  The fact that a query is asked, does not imply that any particular answer is desired or expected.  According to the documented Height and Weight in CHL/EPIC, the patients BMI is greater than 40. If your clinical findings/judgment agrees with this, please document this along with the related diagnosis in the progress note and discharge summary. THANK YOU!    BEST PRACTICE: A diagnosis of UNDERWEIGHT or MORBID OBESITY should have the BMI documented along with it, if known.  Also "Patient is obese black female in no acute distress." was documented in the H&P. Please provided greater specificity, if known.  Possible Clinical Conditions?  - Morbid Obesity  - Other condition (please document in  the progress notes and/or discharge summary)  - Cannot Clinically determine at this time   Supporting Information:  Weight: 229 lbs Height 5\' 3"  BMI= 40.8     Reviewed:  no additional documentation provided  Thank You,  Eldred Manges  Clinical Documentation Specialist Health Information Management [place] Email: [email or web address]

## 2011-12-03 NOTE — Progress Notes (Signed)
Occupational Therapy Evaluation Patient Details Name: Eileen Norris MRN: [id number] DOB: Aug 15, 1941 Today's Date: 12/03/2011  Problem List:  Patient Active Problem List  Diagnoses  . HNP (herniated nucleus pulposus) with myelopathy, cervical    Past Medical History:  Past Medical History  Diagnosis Date  . Shortness of breath   . Hypertension   . GERD (gastroesophageal reflux disease)   . Arthritis     Neck, knees, hands  . Dysrhythmia     "beating fast & flooding"   Past Surgical History:  Past Surgical History  Procedure Date  . Joint replacement     bilateral knees,   . Abdominal hysterectomy     OT Assessment/Plan/Recommendation OT Assessment OT Recommendation/Assessment: Patient does not need any further OT services OT Recommendation Follow Up Recommendations: No OT follow up Equipment Recommended: None recommended by OT OT Goals    OT Evaluation Precautions/Restrictions  Precautions Precaution Comments: Cervical - educated in posture as tendency to flex. Educatd in cervical precautions. (provided hand exercises and theraputty yellow color) Required Braces or Orthoses: Yes Cervical Brace: Soft collar Restrictions Weight Bearing Restrictions: No Prior Functioning Home Living Lives With: Alone Receives Help From: Family;Friend(s) Type of Home: Apartment Home Layout: One level Home Access: Stairs to enter Entrance Stairs-Rails: Right Entrance Stairs-Number of Steps: 1 Bathroom Shower/Tub: Engineer, manufacturing systems: Standard Home Adaptive Equipment: Shower chair with back;Raised toilet seat with rails;Walker - four wheeled;Straight cane Prior Function Level of Independence: Independent with basic ADLs;Independent with homemaking with ambulation Able to Take Stairs?: Yes Driving: No Vocation: Retired ADL ADL Eating/Feeding: Simulated;Modified independent Where Assessed - Eating/Feeding: Chair Grooming: Simulated;Wash/dry hands;Modified  independent Where Assessed - Grooming: Standing at sink Lower Body Dressing: Performed;Modified independent Where Assessed - Lower Body Dressing: Sit to stand from chair Toilet Transfer: Performed;Modified independent Toilet Transfer Method: Stand pivot;Ambulating Toilet Transfer Equipment: Regular height toilet;Grab bars Toileting - Clothing Manipulation: Performed;Modified independent Where Assessed - Toileting Clothing Manipulation: Sit to stand from 3-in-1 or toilet Toileting - Hygiene: Performed;Modified independent Where Assessed - Toileting Hygiene: Sit to stand from 3-in-1 or toilet Equipment Used: Cane ADL Comments: Pt describes completing sponge bath this AM at the sink level with basin. Pt able to don / doff socks without assistance. Pt educated on hand exercise program to strengthen grip. Pt return deomonstrated all exercises. pt educated on avoiding extension and flexion of neck. Pt don/ doff cervical collar with Min A using mirror due to first attempt. Pt recalling all cervical precautions. Vision/Perception  Vision - History Baseline Vision: No visual deficits Patient Visual Report: No change from baseline Cognition Cognition Arousal/Alertness: Awake/alert Overall Cognitive Status: Appears within functional limits for tasks assessed Orientation Level: Oriented X4 Sensation/Coordination Sensation Light Touch: Appears Intact Coordination Gross Motor Movements are Fluid and Coordinated: Yes Fine Motor Movements are Fluid and Coordinated: Yes Extremity Assessment   Mobility  Bed Mobility Bed Mobility: No Rolling Right: 7: Independent Right Sidelying to Sit: 7: Independent Transfers Transfers: Yes Sit to Stand: 6: Modified independent (Device/Increase time);From chair/3-in-1;With upper extremity assist Stand to Sit: To chair/3-in-1;7: Independent;With upper extremity assist Exercises   End of Session OT - End of Session Equipment Utilized During Treatment: Gait  belt Activity Tolerance: Patient tolerated treatment well Patient left: in chair;with call bell in reach Nurse Communication: Mobility status for transfers;Mobility status for ambulation General Behavior During Session: [facility] for tasks performed Cognition: [facility] for tasks performed   Lucile Shutters 12/03/2011, 11:44 AM  Pager: [phone]

## 2011-12-04 MED ORDER — DEXAMETHASONE 2 MG PO TABS
2.0000 mg | ORAL_TABLET | Freq: Two times a day (BID) | ORAL | Status: DC
  Administered 2011-12-04 – 2011-12-05 (×2): 2 mg via ORAL
  Filled 2011-12-04 (×4): qty 1

## 2011-12-04 NOTE — Progress Notes (Signed)
Filed Vitals:   12/03/11 2100 12/04/11 0300 12/04/11 0500 12/04/11 1000  BP: 121/66 131/67 156/79 117/71  Pulse: 59 58 54 58  Temp: 98 F (36.7 C) 98.2 F (36.8 C) 98 F (36.7 C) 97.5 F (36.4 C)  TempSrc: Oral Oral Oral Oral  Resp: 18 18 18 18   Height:      Weight:      SpO2: 94% 94% 99% 100%    Patient resting in bed comfortably now. She is ambulated twice with assistance a staff; she carries a single-point cane, but is not really using it. She's been seen by both physical therapy and occupational therapy. Wound is healing nicely there is no swelling erythema or drainage, Dermabond is in place.   Plan: Will continue Decadron taper, if patient remains stable, we'll be able to discharge to home with home health PT.

## 2011-12-04 NOTE — Progress Notes (Signed)
Physical Therapy Treatment Patient Details Name: Eileen Norris MRN: [id number] DOB: 1941-10-31 Today's Date: 12/04/2011  PT Assessment/Plan  PT - Assessment/Plan Comments on Treatment Session: Pt up going to bathroom when therapist entered the room.  Pt reported she had already been for a walk.  Pt close to being modified independent with all mobility.  Pt with increased lateral weight shift during ambulation with decreased step length which puts her at risk for falls. PT Plan: Discharge plan remains appropriate Follow Up Recommendations: Home health PT PT Goals  Acute Rehab PT Goals PT Goal: Ambulate - Progress: Progressing toward goal Additional Goals PT Goal: Additional Goal #1 - Progress: Progressing toward goal  PT Treatment Precautions/Restrictions  Precautions Precautions: Fall Precaution Comments: Cervical - educated in posture as tendency to flex. Educatd in cervical precautions. (provided hand exercises and theraputty yellow color) Required Braces or Orthoses: Yes Cervical Brace: Soft collar Restrictions Weight Bearing Restrictions: No Mobility (including Balance) Bed Mobility Bed Mobility: No Transfers Transfers: Yes Sit to Stand: 6: Modified independent (Device/Increase time) Stand to Sit: 6: Modified independent (Device/Increase time) Ambulation/Gait Ambulation/Gait: Yes Ambulation/Gait Assistance: 5: Supervision Ambulation Distance (Feet): 200 Feet Assistive device: Straight cane Gait Pattern: Decreased step length - right;Decreased step length - left (Increased lateral weight shift with each step bilateral.) Stairs: Yes Stairs Assistance: 5: Supervision Stair Management Technique: Two rails Number of Stairs: 2  Height of Stairs: 6  Wheelchair Mobility Wheelchair Mobility: No  End of Session PT - End of Session Activity Tolerance: Patient tolerated treatment well Patient left: in chair General Behavior During Session: [facility] for tasks performed Cognition: [facility]  for tasks performed  Georges Mouse 12/04/2011, 12:02 PM

## 2011-12-05 NOTE — Progress Notes (Signed)
Patient's D/C instructions and education completed. Patient has no further questions. Pt. In no signs of acute distress.

## 2011-12-05 NOTE — Progress Notes (Signed)
Physical Therapy Treatment Patient Details Name: Eileen Norris MRN: [id number] DOB: [birth year] Today's Date: 12/05/2011  PT Assessment/Plan  PT - Assessment/Plan Comments on Treatment Session: Pt reports discharge today.  Pt with no concerns about discharge reports she feels ready.  Pt is modified independent with mobility using her cane.  Pt reports she has a RW, but chooses not to use it.  Pt would increase her gait speed and decrease lateral trunk sway during gait if she used RW. PT Plan: All goals met and education completed, patient dischaged from PT services Follow Up Recommendations: Home health PT PT Goals  Acute Rehab PT Goals PT Goal: Ambulate - Progress: Met Additional Goals PT Goal: Additional Goal #1 - Progress: Other (comment) (Not assessed at discharge)  PT Treatment Precautions/Restrictions  Precautions Precautions: Fall Required Braces or Orthoses: Yes Cervical Brace: Soft collar Restrictions Weight Bearing Restrictions: No Mobility (including Balance) Transfers Sit to Stand: 6: Modified independent (Device/Increase time) Stand to Sit: 6: Modified independent (Device/Increase time) Ambulation/Gait Ambulation/Gait Assistance: 6: Modified independent (Device/Increase time) Ambulation Distance (Feet): 250 Feet Assistive device: Straight cane Gait Pattern:  (Increased lateral trunk sway bilaterally.) Gait velocity: decreased speed  Stairs Assistance: 6: Modified independent (Device/Increase time) Stair Management Technique: Two rails Number of Stairs: 5     End of Session PT - End of Session Equipment Utilized During Treatment: Cervical collar Activity Tolerance: Patient tolerated treatment well Patient left: in chair General Behavior During Session: [facility] for tasks performed Cognition: [facility] for tasks performed  Georges Mouse 12/05/2011, 9:31 AM

## 2011-12-05 NOTE — Discharge Summary (Signed)
Physician Discharge Summary  Patient ID: Eileen Norris MRN: [id number] DOB/AGE: 73/26/1942 71 y.o.  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 Preoperative workup including x-rays and laboratories were performed. Patient was noted to have improvement in neurologic function with the intravenous Decadron. Patient was taken to surgery on the second hospital day and underwent a C3-4 anterior cervical decompression and arthrodesis with allograft and tether cervical plating. Postoperatively she has continued to improve with better strength in her upper and lower extremities, better use of her hands, and better stability when walking. Physical therapy and occupational therapy were consulted and have worked with the patient through her hospitalization. The patient's wound is healed well. There is no swelling erythema or drainage. Arrangements are being made for home health physical therapy to assist her with improving her transfers and mobility.  Consults: none  Significant Diagnostic Studies: None  Treatments: surgery: C3-4 anterior cervical decompression and arthrodesis  Discharge Exam: Blood pressure 124/74, pulse 51, temperature 98.1 F (36.7 C), temperature source Oral, resp. rate 16, height 5\' 3"  (1.6 m), weight 104.3 kg (229 lb 15 oz), SpO2 99.00%. Wound is healing well.  Disposition: Home   Current Discharge Medication List    CONTINUE these medications which have NOT CHANGED   Details  dexamethasone (DECADRON) 10 MG/ML injection Inject 10 mg into the  vein every 6 (six) hours as needed. For inflammation.     hydrochlorothiazide (HYDRODIURIL) 25 MG tablet Take 25 mg by mouth daily.      meloxicam (MOBIC) 7.5 MG tablet Take 7.5 mg by mouth daily.      metoprolol (LOPRESSOR) 50 MG tablet Take 50 mg by mouth 2 (two) times daily.      omeprazole (PRILOSEC) 20 MG capsule Take 20 mg by mouth daily.      simvastatin (ZOCOR) 40 MG tablet Take 40 mg by mouth every evening.           SignedHewitt Shorts, MD 12/05/2011, 4:04 PM

## 2011-12-05 NOTE — Progress Notes (Signed)
Patient offered choice for Home Health. [facility] selected for therapy. Will discharge today.

## 2011-12-06 NOTE — Progress Notes (Signed)
CARE MANAGEMENT NOTE 12/06/2011 Discharge planning. Spoke with patient and family. Choice offered. Has family assistance at discharge. Uses a cane, no DME needed.

## 2012-12-03 ENCOUNTER — Ambulatory Visit: Payer: Self-pay

## 2012-12-18 ENCOUNTER — Ambulatory Visit: Payer: Self-pay | Admitting: Internal Medicine

## 2012-12-18 LAB — CBC CANCER CENTER
Comment - H1-Com1: NORMAL
HCT: 38.3 % (ref 35.0–47.0)
HGB: 12.8 g/dL (ref 12.0–16.0)
MCHC: 33.4 g/dL (ref 32.0–36.0)
Monocytes: 10 %
RBC: 4.21 10*6/uL (ref 3.80–5.20)
Variant Lymphocyte: 3 %
WBC: 5.3 x10 3/mm (ref 3.6–11.0)

## 2012-12-26 ENCOUNTER — Ambulatory Visit: Payer: Self-pay | Admitting: Internal Medicine

## 2013-01-23 ENCOUNTER — Ambulatory Visit: Payer: Self-pay | Admitting: Internal Medicine

## 2013-05-06 ENCOUNTER — Other Ambulatory Visit: Payer: Self-pay | Admitting: Family Medicine

## 2013-05-06 DIAGNOSIS — M545 Low back pain: Secondary | ICD-10-CM

## 2013-05-15 ENCOUNTER — Ambulatory Visit
Admission: RE | Admit: 2013-05-15 | Discharge: 2013-05-15 | Disposition: A | Discharge status: Home / Self Care | Payer: Medicare Other | Source: Ambulatory Visit | Attending: Family Medicine | Admitting: Family Medicine

## 2013-05-15 DIAGNOSIS — M545 Low back pain: Secondary | ICD-10-CM

## 2013-12-07 ENCOUNTER — Ambulatory Visit: Payer: Self-pay | Admitting: Internal Medicine

## 2014-12-05 DIAGNOSIS — R079 Chest pain, unspecified: Secondary | ICD-10-CM | POA: Diagnosis not present

## 2014-12-06 DIAGNOSIS — Z96651 Presence of right artificial knee joint: Secondary | ICD-10-CM | POA: Diagnosis not present

## 2014-12-06 DIAGNOSIS — Z96652 Presence of left artificial knee joint: Secondary | ICD-10-CM | POA: Diagnosis not present

## 2015-01-05 DIAGNOSIS — I498 Other specified cardiac arrhythmias: Secondary | ICD-10-CM | POA: Diagnosis not present

## 2015-01-05 DIAGNOSIS — R000 Tachycardia, unspecified: Secondary | ICD-10-CM | POA: Diagnosis not present

## 2015-01-24 ENCOUNTER — Ambulatory Visit: Payer: Self-pay | Admitting: Internal Medicine

## 2015-01-24 DIAGNOSIS — Z1231 Encounter for screening mammogram for malignant neoplasm of breast: Secondary | ICD-10-CM | POA: Diagnosis not present

## 2015-03-09 DIAGNOSIS — S43409A Unspecified sprain of unspecified shoulder joint, initial encounter: Secondary | ICD-10-CM | POA: Diagnosis not present

## 2015-03-09 DIAGNOSIS — M4802 Spinal stenosis, cervical region: Secondary | ICD-10-CM | POA: Diagnosis not present

## 2015-03-09 DIAGNOSIS — I10 Essential (primary) hypertension: Secondary | ICD-10-CM | POA: Diagnosis not present

## 2015-03-09 DIAGNOSIS — R002 Palpitations: Secondary | ICD-10-CM | POA: Diagnosis not present

## 2015-03-19 NOTE — H&P (Signed)
PATIENT NAME:  Eileen Norris, Eileen Norris MR#:  [id number] DATE OF BIRTH:  05-12-1941  DATE OF ADMISSION:  11/28/2011  REFERRING PHYSICIAN: Dr. Benjaman Lobe    PRIMARY CARE PHYSICIAN: Laurian Brim, MD   PRESENTING COMPLAINT: Weakness and gait disturbance.   HISTORY OF PRESENT ILLNESS: [name] is a pleasant 74 year old woman with history of hypertension, hyperlipidemia, arthritis, and gastroesophageal reflux disease who presents today with reports of difficulty walking and weakness with imbalance. It appears for maybe two months now the patient reports initially having symptoms of neck soreness and difficulty turning her neck. She was evaluated by her primary care physician who gave her some medicine for muscle spasms and reports some improvement. Following that she developed some difficulty walking and imbalance with now requiring a walker for ambulation. She has decreased strength in both her arms and her legs. She denies any presyncope or syncopal episodes. No visual disturbance, dysarthria, or slurred speech. Denies any one-sided weakness over the other. Reports that she has some mild improvement when she takes her meloxicam, however, since initially started maybe a month ago her symptoms have progressively worsened. She denies any falls but she is afraid of walking due to her unsteadiness of falls. She denies any chest pain or shortness of breath. No orthopnea or PND. She has intermittent ankle edema which is responsive to fluid pills that she takes as needed.   PAST MEDICAL HISTORY:  1. Arthritis.  2. Gastroesophageal reflux disease. She had an EGD in February 2012 that showed reflux esophagitis.  3. Colonoscopy in February 2012 showed hemorrhoids, diverticulosis, and polyps.  4. Hypertension.  5. Hyperlipidemia.   PAST SURGICAL HISTORY:  1. Bilateral knee surgery.  2. Hysterectomy.   ALLERGIES: Tylenol, the patient says causes shakes.   MEDICATIONS:  1. Aspirin 81 mg daily.  2. Meloxicam 7.5 mg  daily.  3. Hydrochlorothiazide 25 mg daily.  4. Metoprolol tartrate 50 mg b.i.d.  5. Lasix 20 mg 1 to 2 times weekly as needed.  6. Simvastatin 40 mg daily.  7. Omeprazole 20 mg daily.   FAMILY HISTORY: Denies any diabetes, heart attacks or stroke.   SOCIAL HISTORY: She lives alone in [place]. Denies any tobacco, alcohol, or drug use.   REVIEW OF SYSTEMS: CONSTITUTIONAL: No fevers, chills, nausea or vomiting. EYES: No blurry vision. No glaucoma or cataracts. ENT: No ear pain, epistaxis, tinnitus, or discharge. RESPIRATORY: No cough, wheezing, hemoptysis, or shortness of breath. CARDIOVASCULAR: No chest pain or orthopnea. No palpitations or syncope. GI: No nausea, vomiting, diarrhea, abdominal pain, hematemesis, or melena. GU: No dysuria or hematuria. ENDOCRINE: No polyuria or polydipsia. HEME: No easy bleeding. SKIN: No ulcers. MUSCULOSKELETAL: Reports neck soreness as per history of present illness. History of arthritis. NEUROLOGIC: As per history of present illness. PSYCH: Denies any depression or suicidal ideation.   PHYSICAL EXAMINATION:   VITAL SIGNS: Temperature 96.6, pulse 62, respiratory rate 20, blood pressure 166/77, sating at 100% on room air.   GENERAL: Lying in bed in no apparent distress.   HEENT: Normocephalic, atraumatic. Pupils equal, symmetric. No nystagmus. Nares without discharge. Moist mucous membrane.   NECK: Soft and supple. No adenopathy or JVP. She has full range of motion of her neck without any reproducible pain with movement.   CARDIOVASCULAR: Non-tachy. No murmurs, rubs, or gallops.   LUNGS: Clear to auscultation bilaterally. No use of accessory muscles or increased respiratory effort.   ABDOMEN: Soft. Positive bowel sounds. No mass appreciated.   EXTREMITIES: Trace edema bilaterally. Dorsal pedis pulses intact.  MUSCULOSKELETAL: No joint effusion.   SKIN: No ulcers.   NEUROLOGIC: She has decreased grip strength of 4 out of 5 of her upper  extremities. Push and pull is 5 out of 5. She does have nose-to-finger ataxia. No asterixis or pronator drift.   PSYCH: The patient is alert, oriented, and cooperative.   PERTINENT LABS AND STUDIES: Urinalysis with specific gravity of 1.010, pH 6, positive nitrite, RBC less than 1 per high-power field, WBC 1 per high-power field. WBC 5.9, hemoglobin 11.6, hematocrit 35.1, platelets 188, MCV 90, glucose 93, BUN 23, creatinine 0.89, sodium 144, potassium 3.5, chloride 105, carbon dioxide 29, calcium 8.9, total bilirubin 0.3, alkaline phosphatase 70, ALT 23, AST 25, total protein 6.9. Troponin less than 0.02. CK 199. MB 2.9. TSH 1.44. CT of the head without contrast shows no acute findings. CT of cervical spine without contrast shows mild broad-based disk bulge at C3, C4. There is degenerative disk disease most significant at C5, C6, and C6-C7 with disk space narrowing and diskogenic endplate osteophytes. EKG with normal sinus rhythm. No ST changes.   ASSESSMENT AND PLAN: [name] is a 74 year old woman with history of hypertension, hyperlipidemia, gastroesophageal reflux disease, colonic polyp, diverticulosis, and osteoarthritis complaining of bilateral weakness, gait disturbance, and poor ambulatory effort.  1. Ataxia, gait disturbance, and weakness, concern for CVA with progression versus cervical spine disease. Her CT of the head is unrevealing. CT with some disk bulge and degenerative disk disease. Will get an MRI of the brain and C-spine as this may be missing cord compression or cord involvement. Will stop her aspirin, start her on Plavix, restart statin, and proceed with work-up for stroke event. Will send TSH, fasting lipid panel, A1c. Will obtain a PT evaluation.  Also, obtain carotid Doppler's and echocardiogram.  2. Hypertension, uncontrolled. Allow for permissive hypertension. Restart her metoprolol and hydrochlorothiazide and adjust for blood pressure control systolic greater than 681.   3. Hyperlipidemia. As above, fasting lipid panel. Restart simvastatin.  4. Prophylaxis with Lovenox, Plavix, and omeprazole.   TIME SPENT: Approximately 50 minutes spent on patient care.   ____________________________ Rita Ohara, MD ap:drc D: 11/29/2011 07:29:06 ET T: 11/29/2011 11:16:51 ET JOB#: [id number]  cc: Brien Few Lyndzie Zentz, MD, <Dictator> Morton Peters., MD Rita Ohara MD ELECTRONICALLY SIGNED 12/14/2011 2:22

## 2015-03-19 NOTE — Discharge Summary (Signed)
PATIENT NAME:  Eileen Norris, DEGRAFFENREID MR#:  [id number] DATE OF BIRTH:  02/07/1941  DATE OF ADMISSION:  11/29/2011 DATE OF DISCHARGE:  11/30/2011  DISPOSITION: Transfer to [facility] for neurosurgery evaluation/intervention.  ACCEPTING PHYSICIAN: Dr. Ovidio Kin at [facility]: Dr. Laurian Brim   CHIEF COMPLAINT: Weakness and gait disturbance.   DISCHARGE DIAGNOSES:  1. Weakness and ataxia in the setting of severe spinal stenosis and C3-C4 disk herniation and spinal cord edema.  2. Hypertension.  3. Arthritis.  4. Gastroesophageal reflux disease.  5. Hyperlipidemia.   DISCHARGE MEDICATIONS:  1. Hydrochlorothiazide 25 mg daily.  2. Zocor 40 mg daily.  3. Prilosec 20 mg daily.  4. Metoprolol tartrate 50 mg 2 times a day.  5. Meloxicam 7.5 mg daily.  6. Decadron 10 mg IV q.6h. as needed.   DIET: Low sodium, ADA diet.   ACTIVITY: As tolerated.   HISTORY OF PRESENT ILLNESS: Please see the full history and physical dictated on the 11/28/2011 by Dr. Inez Catalina. Briefly, this is a 74 year old African American female with hypertension, hyperlipidemia, arthritis, gastroesophageal reflux disease who presented with gait disturbance and weakness for 1 to 2 months. She was admitted to hospitalist service for further evaluation and management. She had decreased strength in both arms and legs and symptoms have progressively worsened.   HOSPITAL COURSE: Patient was admitted and extensive work-up including CT scans of the brain and cervical spine including MRI of brain with cervical spine was done. Furthermore ultrasound of the carotids were done as well which did not show any significant stenosis. On the work-up, MRI of the C-spine showed multilevel severe degenerative disk disease with disk herniation at C3-C4 causing severe spinal canal stenosis and spinal cord edema. There was also C4-C5, C5-C6 and C6-C7 spinal canal narrowing that is less severe. Given the above and the fact that  we do not have any neurosurgery here at [facility] was contacted for further work-up and possible intervention. At this point, Dr. Rita Ohara at [facility] from neurosurgery has accepted the patient. Of note the patient did receive Plavix here which I have stopped as well as the Lovenox. Patient has no bowel or urine incontinence. She is ambulatory. Strength is 4+/5 in the upper extremities. Patient will be transferred to [facility] today for further intervention. She has been started on Decadron 10 mg IV every six hours as well.   CODE STATUS: FULL CODE.   TOTAL TIME SPENT: 35 minutes on the discharge material.  ____________________________ Vivien Presto, MD sa:cms D: 11/30/2011 14:09:14 ET T: 11/30/2011 14:37:13 ET JOB#: [id number]  cc: Vivien Presto, MD, <Dictator> Morton Peters., MD Vivien Presto MD ELECTRONICALLY SIGNED 12/06/2011 20:37

## 2015-03-20 ENCOUNTER — Ambulatory Visit
Admit: 2015-03-20 | Disposition: A | Discharge status: Home / Self Care | Payer: Self-pay | Attending: Gastroenterology | Admitting: Gastroenterology

## 2015-03-20 DIAGNOSIS — Z79899 Other long term (current) drug therapy: Secondary | ICD-10-CM | POA: Diagnosis not present

## 2015-03-20 DIAGNOSIS — Z7982 Long term (current) use of aspirin: Secondary | ICD-10-CM | POA: Diagnosis not present

## 2015-03-20 DIAGNOSIS — Z86010 Personal history of colonic polyps: Secondary | ICD-10-CM | POA: Diagnosis not present

## 2015-03-20 DIAGNOSIS — Z09 Encounter for follow-up examination after completed treatment for conditions other than malignant neoplasm: Secondary | ICD-10-CM | POA: Diagnosis not present

## 2015-03-20 DIAGNOSIS — Z79891 Long term (current) use of opiate analgesic: Secondary | ICD-10-CM | POA: Diagnosis not present

## 2015-03-20 DIAGNOSIS — I10 Essential (primary) hypertension: Secondary | ICD-10-CM | POA: Diagnosis not present

## 2015-03-20 DIAGNOSIS — Z96653 Presence of artificial knee joint, bilateral: Secondary | ICD-10-CM | POA: Diagnosis not present

## 2015-05-25 DIAGNOSIS — L639 Alopecia areata, unspecified: Secondary | ICD-10-CM | POA: Diagnosis not present

## 2015-05-25 DIAGNOSIS — L659 Nonscarring hair loss, unspecified: Secondary | ICD-10-CM | POA: Diagnosis not present

## 2015-05-25 DIAGNOSIS — M4802 Spinal stenosis, cervical region: Secondary | ICD-10-CM | POA: Diagnosis not present

## 2015-05-25 DIAGNOSIS — I471 Supraventricular tachycardia: Secondary | ICD-10-CM | POA: Diagnosis not present

## 2015-06-08 DIAGNOSIS — H5213 Myopia, bilateral: Secondary | ICD-10-CM | POA: Diagnosis not present

## 2015-06-08 DIAGNOSIS — H2513 Age-related nuclear cataract, bilateral: Secondary | ICD-10-CM | POA: Diagnosis not present

## 2015-08-11 DIAGNOSIS — M5416 Radiculopathy, lumbar region: Secondary | ICD-10-CM | POA: Diagnosis not present

## 2015-08-25 DIAGNOSIS — R5381 Other malaise: Secondary | ICD-10-CM | POA: Diagnosis not present

## 2015-08-25 DIAGNOSIS — M4802 Spinal stenosis, cervical region: Secondary | ICD-10-CM | POA: Diagnosis not present

## 2015-08-25 DIAGNOSIS — I471 Supraventricular tachycardia: Secondary | ICD-10-CM | POA: Diagnosis not present

## 2015-08-25 DIAGNOSIS — E784 Other hyperlipidemia: Secondary | ICD-10-CM | POA: Diagnosis not present

## 2015-08-25 DIAGNOSIS — I10 Essential (primary) hypertension: Secondary | ICD-10-CM | POA: Diagnosis not present

## 2015-09-05 DIAGNOSIS — M5416 Radiculopathy, lumbar region: Secondary | ICD-10-CM | POA: Diagnosis not present

## 2015-09-22 DIAGNOSIS — I10 Essential (primary) hypertension: Secondary | ICD-10-CM | POA: Diagnosis not present

## 2015-09-22 DIAGNOSIS — S43409A Unspecified sprain of unspecified shoulder joint, initial encounter: Secondary | ICD-10-CM | POA: Diagnosis not present

## 2015-09-22 DIAGNOSIS — I471 Supraventricular tachycardia: Secondary | ICD-10-CM | POA: Diagnosis not present

## 2015-09-22 DIAGNOSIS — M4802 Spinal stenosis, cervical region: Secondary | ICD-10-CM | POA: Diagnosis not present

## 2015-10-03 DIAGNOSIS — M5416 Radiculopathy, lumbar region: Secondary | ICD-10-CM | POA: Diagnosis not present

## 2015-11-15 DIAGNOSIS — Z0000 Encounter for general adult medical examination without abnormal findings: Secondary | ICD-10-CM | POA: Diagnosis not present

## 2015-11-23 DIAGNOSIS — M5116 Intervertebral disc disorders with radiculopathy, lumbar region: Secondary | ICD-10-CM | POA: Diagnosis not present

## 2015-11-23 DIAGNOSIS — M25561 Pain in right knee: Secondary | ICD-10-CM | POA: Diagnosis not present

## 2015-11-23 DIAGNOSIS — M25562 Pain in left knee: Secondary | ICD-10-CM | POA: Diagnosis not present

## 2016-01-16 DIAGNOSIS — M549 Dorsalgia, unspecified: Secondary | ICD-10-CM | POA: Diagnosis not present

## 2016-01-16 DIAGNOSIS — M4806 Spinal stenosis, lumbar region: Secondary | ICD-10-CM | POA: Diagnosis not present

## 2016-01-16 DIAGNOSIS — M5136 Other intervertebral disc degeneration, lumbar region: Secondary | ICD-10-CM | POA: Diagnosis not present

## 2016-01-16 DIAGNOSIS — M4316 Spondylolisthesis, lumbar region: Secondary | ICD-10-CM | POA: Diagnosis not present

## 2016-01-16 DIAGNOSIS — M546 Pain in thoracic spine: Secondary | ICD-10-CM | POA: Diagnosis not present

## 2016-02-01 DIAGNOSIS — M4316 Spondylolisthesis, lumbar region: Secondary | ICD-10-CM | POA: Diagnosis not present

## 2016-02-01 DIAGNOSIS — M4726 Other spondylosis with radiculopathy, lumbar region: Secondary | ICD-10-CM | POA: Diagnosis not present

## 2016-02-01 DIAGNOSIS — M5136 Other intervertebral disc degeneration, lumbar region: Secondary | ICD-10-CM | POA: Diagnosis not present

## 2016-02-01 DIAGNOSIS — M4806 Spinal stenosis, lumbar region: Secondary | ICD-10-CM | POA: Diagnosis not present

## 2016-02-01 DIAGNOSIS — M47816 Spondylosis without myelopathy or radiculopathy, lumbar region: Secondary | ICD-10-CM | POA: Diagnosis not present

## 2016-02-13 DIAGNOSIS — M5440 Lumbago with sciatica, unspecified side: Secondary | ICD-10-CM | POA: Diagnosis not present

## 2016-02-13 DIAGNOSIS — S43409A Unspecified sprain of unspecified shoulder joint, initial encounter: Secondary | ICD-10-CM | POA: Diagnosis not present

## 2016-02-13 DIAGNOSIS — M4802 Spinal stenosis, cervical region: Secondary | ICD-10-CM | POA: Diagnosis not present

## 2016-03-12 ENCOUNTER — Other Ambulatory Visit: Payer: Self-pay | Admitting: Neurosurgery

## 2016-03-12 DIAGNOSIS — M5136 Other intervertebral disc degeneration, lumbar region: Secondary | ICD-10-CM | POA: Diagnosis not present

## 2016-03-12 DIAGNOSIS — M4316 Spondylolisthesis, lumbar region: Secondary | ICD-10-CM | POA: Diagnosis not present

## 2016-03-12 DIAGNOSIS — M4726 Other spondylosis with radiculopathy, lumbar region: Secondary | ICD-10-CM | POA: Diagnosis not present

## 2016-03-12 DIAGNOSIS — M4806 Spinal stenosis, lumbar region: Secondary | ICD-10-CM | POA: Diagnosis not present

## 2016-03-26 ENCOUNTER — Encounter (HOSPITAL_COMMUNITY)
Admission: RE | Admit: 2016-03-26 | Discharge: 2016-03-26 | Disposition: A | Discharge status: Home / Self Care | Payer: Medicare Other | Source: Ambulatory Visit | Attending: Neurosurgery | Admitting: Neurosurgery

## 2016-03-26 ENCOUNTER — Encounter (HOSPITAL_COMMUNITY): Payer: Self-pay

## 2016-03-26 DIAGNOSIS — Z01812 Encounter for preprocedural laboratory examination: Secondary | ICD-10-CM | POA: Diagnosis not present

## 2016-03-26 DIAGNOSIS — Z01818 Encounter for other preprocedural examination: Secondary | ICD-10-CM | POA: Diagnosis not present

## 2016-03-26 DIAGNOSIS — M4806 Spinal stenosis, lumbar region: Secondary | ICD-10-CM | POA: Insufficient documentation

## 2016-03-26 DIAGNOSIS — Z0183 Encounter for blood typing: Secondary | ICD-10-CM | POA: Insufficient documentation

## 2016-03-26 DIAGNOSIS — R001 Bradycardia, unspecified: Secondary | ICD-10-CM | POA: Insufficient documentation

## 2016-03-26 DIAGNOSIS — I10 Essential (primary) hypertension: Secondary | ICD-10-CM | POA: Insufficient documentation

## 2016-03-26 LAB — BASIC METABOLIC PANEL
Anion gap: 9 (ref 5–15)
BUN: 15 mg/dL (ref 6–20)
CO2: 29 mmol/L (ref 22–32)
Calcium: 9.3 mg/dL (ref 8.9–10.3)
Chloride: 103 mmol/L (ref 101–111)
Creatinine, Ser: 0.94 mg/dL (ref 0.44–1.00)
GFR calc Af Amer: >60 mL/min (ref >60)
GFR calc non Af Amer: 57 mL/min — ABNORMAL LOW (ref >60)
Glucose, Bld: 100 mg/dL — ABNORMAL HIGH (ref 65–99)
Potassium: 3.6 mmol/L (ref 3.5–5.1)
Sodium: 141 mmol/L (ref 135–145)

## 2016-03-26 LAB — CBC
HCT: 38.8 % (ref 36.0–46.0)
Hemoglobin: 12.5 g/dL (ref 12.0–15.0)
MCH: 30.2 pg (ref 26.0–34.0)
MCHC: 32.2 g/dL (ref 30.0–36.0)
MCV: 93.7 fL (ref 78.0–100.0)
Platelets: 184 10*3/uL (ref 150–400)
RBC: 4.14 MIL/uL (ref 3.87–5.11)
RDW: 13.7 % (ref 11.5–15.5)
WBC: 4.8 10*3/uL (ref 4.0–10.5)

## 2016-03-26 LAB — TYPE AND SCREEN
ABO/RH(D): O POS
Antibody Screen: NEGATIVE

## 2016-03-26 LAB — SURGICAL PCR SCREEN
MRSA, PCR: NEGATIVE
Staphylococcus aureus: NEGATIVE

## 2016-03-26 LAB — ABO/RH: ABO/RH(D): O POS

## 2016-03-26 NOTE — Progress Notes (Signed)
REQUESTED STRESS TEST, ECHO, EKG, OV FROM DR. MASOOD.   [phone]

## 2016-03-26 NOTE — Progress Notes (Signed)
LEFT MESSAGE WITH ASHELY, DR. MASOOD'S NURSE TO FAX STRESS TEST IF PATIENT HAD ONE DONE.

## 2016-04-01 ENCOUNTER — Encounter (HOSPITAL_COMMUNITY): Payer: Self-pay | Admitting: Anesthesiology

## 2016-04-01 ENCOUNTER — Inpatient Hospital Stay (HOSPITAL_COMMUNITY): Payer: Medicare Other | Admitting: Anesthesiology

## 2016-04-01 ENCOUNTER — Inpatient Hospital Stay (HOSPITAL_COMMUNITY): Payer: Medicare Other

## 2016-04-01 ENCOUNTER — Encounter (HOSPITAL_COMMUNITY)
Admission: RE | Disposition: A | Discharge status: Home Health | Payer: Self-pay | Source: Ambulatory Visit | Attending: Neurosurgery

## 2016-04-01 ENCOUNTER — Inpatient Hospital Stay (HOSPITAL_COMMUNITY)
Admission: RE | Admit: 2016-04-01 | Discharge: 2016-04-02 | DRG: 460 | Disposition: A | Discharge status: Home / Self Care | Payer: Medicare Other | Source: Ambulatory Visit | Attending: Neurosurgery | Admitting: Neurosurgery

## 2016-04-01 DIAGNOSIS — M545 Low back pain: Secondary | ICD-10-CM | POA: Diagnosis present

## 2016-04-01 DIAGNOSIS — M4316 Spondylolisthesis, lumbar region: Secondary | ICD-10-CM | POA: Diagnosis present

## 2016-04-01 DIAGNOSIS — Z79899 Other long term (current) drug therapy: Secondary | ICD-10-CM

## 2016-04-01 DIAGNOSIS — K219 Gastro-esophageal reflux disease without esophagitis: Secondary | ICD-10-CM | POA: Diagnosis not present

## 2016-04-01 DIAGNOSIS — Z7982 Long term (current) use of aspirin: Secondary | ICD-10-CM

## 2016-04-01 DIAGNOSIS — M4806 Spinal stenosis, lumbar region: Principal | ICD-10-CM | POA: Diagnosis present

## 2016-04-01 DIAGNOSIS — Z96653 Presence of artificial knee joint, bilateral: Secondary | ICD-10-CM | POA: Diagnosis present

## 2016-04-01 DIAGNOSIS — M47816 Spondylosis without myelopathy or radiculopathy, lumbar region: Secondary | ICD-10-CM | POA: Diagnosis present

## 2016-04-01 DIAGNOSIS — M1990 Unspecified osteoarthritis, unspecified site: Secondary | ICD-10-CM | POA: Diagnosis not present

## 2016-04-01 DIAGNOSIS — M5136 Other intervertebral disc degeneration, lumbar region: Secondary | ICD-10-CM | POA: Diagnosis present

## 2016-04-01 DIAGNOSIS — M4326 Fusion of spine, lumbar region: Secondary | ICD-10-CM | POA: Diagnosis not present

## 2016-04-01 DIAGNOSIS — I10 Essential (primary) hypertension: Secondary | ICD-10-CM | POA: Diagnosis present

## 2016-04-01 DIAGNOSIS — M48062 Spinal stenosis, lumbar region with neurogenic claudication: Secondary | ICD-10-CM | POA: Diagnosis present

## 2016-04-01 DIAGNOSIS — Z419 Encounter for procedure for purposes other than remedying health state, unspecified: Secondary | ICD-10-CM

## 2016-04-01 SURGERY — POSTERIOR LUMBAR FUSION 1 LEVEL
Anesthesia: General | Site: Back

## 2016-04-01 MED ORDER — VITAMIN D3 25 MCG (1000 UNIT) PO TABS
1000.0000 [IU] | ORAL_TABLET | Freq: Every day | ORAL | Status: DC
  Administered 2016-04-01 – 2016-04-02 (×2): 1000 [IU] via ORAL
  Filled 2016-04-01 (×4): qty 1

## 2016-04-01 MED ORDER — BISACODYL 10 MG RE SUPP: 10.0000 mg | Freq: Every day | RECTAL | Status: DC | PRN

## 2016-04-01 MED ORDER — PROPOFOL 10 MG/ML IV BOLUS
INTRAVENOUS | Status: AC
  Filled 2016-04-01: qty 20

## 2016-04-01 MED ORDER — THROMBIN 5000 UNITS EX SOLR
OROMUCOSAL | Status: DC | PRN
  Administered 2016-04-01: 10:00:00 via TOPICAL

## 2016-04-01 MED ORDER — MAGNESIUM HYDROXIDE 400 MG/5ML PO SUSP
30.0000 mL | Freq: Every day | ORAL | Status: DC | PRN
Start: 2016-04-01 — End: 2016-04-02

## 2016-04-01 MED ORDER — LIDOCAINE 2% (20 MG/ML) 5 ML SYRINGE
INTRAMUSCULAR | Status: AC
  Filled 2016-04-01: qty 5

## 2016-04-01 MED ORDER — PHENYLEPHRINE HCL 10 MG/ML IJ SOLN
INTRAMUSCULAR | Status: DC | PRN
  Administered 2016-04-01: 80 ug via INTRAVENOUS

## 2016-04-01 MED ORDER — ZOLPIDEM TARTRATE 5 MG PO TABS: 5.0000 mg | ORAL_TABLET | Freq: Every evening | ORAL | Status: DC | PRN

## 2016-04-01 MED ORDER — LACTATED RINGERS IV SOLN
INTRAVENOUS | Status: DC | PRN
  Administered 2016-04-01 (×2): via INTRAVENOUS

## 2016-04-01 MED ORDER — ONDANSETRON HCL 4 MG/2ML IJ SOLN
INTRAMUSCULAR | Status: DC | PRN
  Administered 2016-04-01: 4 mg via INTRAVENOUS

## 2016-04-01 MED ORDER — CEFAZOLIN SODIUM 1 G IJ SOLR
INTRAMUSCULAR | Status: AC
Start: 2016-04-01 — End: 2016-04-01
  Filled 2016-04-01: qty 10

## 2016-04-01 MED ORDER — GLYCOPYRROLATE 0.2 MG/ML IV SOSY
PREFILLED_SYRINGE | INTRAVENOUS | Status: AC
Start: 2016-04-01 — End: 2016-04-01
  Filled 2016-04-01: qty 6

## 2016-04-01 MED ORDER — HYDROMORPHONE HCL 1 MG/ML IJ SOLN: 0.5000 mg | INTRAMUSCULAR | Status: DC | PRN

## 2016-04-01 MED ORDER — PREGABALIN 50 MG PO CAPS
50.0000 mg | ORAL_CAPSULE | Freq: Two times a day (BID) | ORAL | Status: DC
  Administered 2016-04-01 – 2016-04-02 (×2): 50 mg via ORAL
  Filled 2016-04-01 (×2): qty 1

## 2016-04-01 MED ORDER — FENTANYL CITRATE (PF) 250 MCG/5ML IJ SOLN
INTRAMUSCULAR | Status: AC
  Filled 2016-04-01: qty 5

## 2016-04-01 MED ORDER — DEXAMETHASONE SODIUM PHOSPHATE 10 MG/ML IJ SOLN
INTRAMUSCULAR | Status: DC | PRN
Start: 2016-04-01 — End: 2016-04-01
  Administered 2016-04-01: 10 mg via INTRAVENOUS

## 2016-04-01 MED ORDER — 0.9 % SODIUM CHLORIDE (POUR BTL) OPTIME
TOPICAL | Status: DC | PRN
  Administered 2016-04-01 (×3): 1000 mL

## 2016-04-01 MED ORDER — HYDROCHLOROTHIAZIDE 50 MG PO TABS
50.0000 mg | ORAL_TABLET | Freq: Every day | ORAL | Status: DC
  Administered 2016-04-01 – 2016-04-02 (×2): 50 mg via ORAL
  Filled 2016-04-01 (×2): qty 1
  Filled 2016-04-01 (×2): qty 2

## 2016-04-01 MED ORDER — NEOSTIGMINE METHYLSULFATE 5 MG/5ML IV SOSY
PREFILLED_SYRINGE | INTRAVENOUS | Status: AC
  Filled 2016-04-01: qty 5

## 2016-04-01 MED ORDER — ACETAMINOPHEN 10 MG/ML IV SOLN
INTRAVENOUS | Status: AC
  Administered 2016-04-01: 1000 mg via INTRAVENOUS
  Filled 2016-04-01: qty 100

## 2016-04-01 MED ORDER — METOPROLOL TARTRATE 50 MG PO TABS
50.0000 mg | ORAL_TABLET | Freq: Two times a day (BID) | ORAL | Status: DC
  Administered 2016-04-01 – 2016-04-02 (×2): 50 mg via ORAL
  Filled 2016-04-01 (×2): qty 1
  Filled 2016-04-01 (×2): qty 2
  Filled 2016-04-01: qty 1

## 2016-04-01 MED ORDER — BUPIVACAINE HCL (PF) 0.5 % IJ SOLN
INTRAMUSCULAR | Status: DC | PRN
  Administered 2016-04-01: 15 mL

## 2016-04-01 MED ORDER — ALUM & MAG HYDROXIDE-SIMETH 200-200-20 MG/5ML PO SUSP: 30.0000 mL | Freq: Four times a day (QID) | ORAL | Status: DC | PRN

## 2016-04-01 MED ORDER — SODIUM CHLORIDE 0.9% FLUSH: 3.0000 mL | INTRAVENOUS | Status: DC | PRN

## 2016-04-01 MED ORDER — VANCOMYCIN HCL 1000 MG IV SOLR
INTRAVENOUS | Status: AC
  Filled 2016-04-01: qty 1000

## 2016-04-01 MED ORDER — ROCURONIUM BROMIDE 100 MG/10ML IV SOLN
INTRAVENOUS | Status: DC | PRN
  Administered 2016-04-01 (×2): 25 mg via INTRAVENOUS

## 2016-04-01 MED ORDER — SIMVASTATIN 40 MG PO TABS
40.0000 mg | ORAL_TABLET | Freq: Every evening | ORAL | Status: DC
  Administered 2016-04-01: 40 mg via ORAL
  Filled 2016-04-01: qty 2
  Filled 2016-04-01 (×2): qty 1

## 2016-04-01 MED ORDER — HYDROXYZINE HCL 25 MG PO TABS: 50.0000 mg | ORAL_TABLET | ORAL | Status: DC | PRN

## 2016-04-01 MED ORDER — SODIUM CHLORIDE 0.9 % IR SOLN
Status: DC | PRN
  Administered 2016-04-01 (×2)

## 2016-04-01 MED ORDER — FERROUS SULFATE 325 (65 FE) MG PO TABS
325.0000 mg | ORAL_TABLET | Freq: Every day | ORAL | Status: DC
  Administered 2016-04-02: 325 mg via ORAL
  Filled 2016-04-01: qty 1

## 2016-04-01 MED ORDER — FENTANYL CITRATE (PF) 100 MCG/2ML IJ SOLN
INTRAMUSCULAR | Status: DC | PRN
  Administered 2016-04-01 (×3): 50 ug via INTRAVENOUS
  Administered 2016-04-01: 100 ug via INTRAVENOUS
  Administered 2016-04-01: 50 ug via INTRAVENOUS

## 2016-04-01 MED ORDER — TRAMADOL HCL 50 MG PO TABS
50.0000 mg | ORAL_TABLET | Freq: Four times a day (QID) | ORAL | Status: DC | PRN
  Administered 2016-04-02: 50 mg via ORAL
  Filled 2016-04-01: qty 1

## 2016-04-01 MED ORDER — ARTIFICIAL TEARS OP OINT
TOPICAL_OINTMENT | OPHTHALMIC | Status: DC | PRN
  Administered 2016-04-01: 1 via OPHTHALMIC

## 2016-04-01 MED ORDER — OXYCODONE-ACETAMINOPHEN 5-325 MG PO TABS: 1.0000 | ORAL_TABLET | ORAL | Status: DC | PRN

## 2016-04-01 MED ORDER — ONDANSETRON HCL 4 MG/2ML IJ SOLN: 4.0000 mg | Freq: Four times a day (QID) | INTRAMUSCULAR | Status: DC | PRN

## 2016-04-01 MED ORDER — DEXAMETHASONE SODIUM PHOSPHATE 10 MG/ML IJ SOLN
INTRAMUSCULAR | Status: AC
  Filled 2016-04-01: qty 1

## 2016-04-01 MED ORDER — MORPHINE SULFATE (PF) 4 MG/ML IV SOLN: 4.0000 mg | INTRAVENOUS | Status: DC | PRN

## 2016-04-01 MED ORDER — GLYCOPYRROLATE 0.2 MG/ML IJ SOLN
INTRAMUSCULAR | Status: DC | PRN
Start: 2016-04-01 — End: 2016-04-01
  Administered 2016-04-01: 0.2 mg via INTRAVENOUS

## 2016-04-01 MED ORDER — KETOROLAC TROMETHAMINE 15 MG/ML IJ SOLN
15.0000 mg | Freq: Once | INTRAMUSCULAR | Status: AC
  Administered 2016-04-01: 15 mg via INTRAVENOUS

## 2016-04-01 MED ORDER — PROPOFOL 10 MG/ML IV BOLUS
INTRAVENOUS | Status: DC | PRN
  Administered 2016-04-01: 140 mg via INTRAVENOUS

## 2016-04-01 MED ORDER — ACETAMINOPHEN 325 MG PO TABS: 650.0000 mg | ORAL_TABLET | ORAL | Status: DC | PRN

## 2016-04-01 MED ORDER — LIDOCAINE HCL (CARDIAC) 20 MG/ML IV SOLN
INTRAVENOUS | Status: DC | PRN
  Administered 2016-04-01: 100 mg via INTRAVENOUS

## 2016-04-01 MED ORDER — PHENYLEPHRINE 40 MCG/ML (10ML) SYRINGE FOR IV PUSH (FOR BLOOD PRESSURE SUPPORT)
PREFILLED_SYRINGE | INTRAVENOUS | Status: AC
Start: 2016-04-01 — End: 2016-04-01
  Filled 2016-04-01: qty 10

## 2016-04-01 MED ORDER — PHENOL 1.4 % MT LIQD: 1.0000 | OROMUCOSAL | Status: DC | PRN

## 2016-04-01 MED ORDER — LIDOCAINE-EPINEPHRINE 1 %-1:100000 IJ SOLN
INTRAMUSCULAR | Status: DC | PRN
  Administered 2016-04-01: 15 mL

## 2016-04-01 MED ORDER — ONDANSETRON HCL 4 MG PO TABS: 4.0000 mg | ORAL_TABLET | Freq: Four times a day (QID) | ORAL | Status: DC | PRN

## 2016-04-01 MED ORDER — ROCURONIUM BROMIDE 50 MG/5ML IV SOLN
INTRAVENOUS | Status: AC
  Filled 2016-04-01: qty 1

## 2016-04-01 MED ORDER — HYDROXYZINE HCL 50 MG/ML IM SOLN: 50.0000 mg | INTRAMUSCULAR | Status: DC | PRN

## 2016-04-01 MED ORDER — MENTHOL 3 MG MT LOZG: 1.0000 | LOZENGE | OROMUCOSAL | Status: DC | PRN

## 2016-04-01 MED ORDER — DEXAMETHASONE SODIUM PHOSPHATE 10 MG/ML IJ SOLN
INTRAMUSCULAR | Status: AC
Start: 2016-04-01 — End: 2016-04-01
  Filled 2016-04-01: qty 1

## 2016-04-01 MED ORDER — ONDANSETRON HCL 4 MG/2ML IJ SOLN: 4.0000 mg | Freq: Once | INTRAMUSCULAR | Status: DC | PRN

## 2016-04-01 MED ORDER — THROMBIN 20000 UNITS EX SOLR
CUTANEOUS | Status: DC | PRN
  Administered 2016-04-01: 09:00:00 via TOPICAL

## 2016-04-01 MED ORDER — KCL IN DEXTROSE-NACL 20-5-0.45 MEQ/L-%-% IV SOLN: INTRAVENOUS | Status: DC

## 2016-04-01 MED ORDER — CYCLOBENZAPRINE HCL 10 MG PO TABS: 10.0000 mg | ORAL_TABLET | Freq: Three times a day (TID) | ORAL | Status: DC | PRN

## 2016-04-01 MED ORDER — CEFAZOLIN SODIUM-DEXTROSE 2-4 GM/100ML-% IV SOLN
2.0000 g | INTRAVENOUS | Status: AC
  Administered 2016-04-01: 2 g via INTRAVENOUS
  Administered 2016-04-01: 1 g via INTRAVENOUS
  Filled 2016-04-01: qty 100

## 2016-04-01 MED ORDER — KETOROLAC TROMETHAMINE 15 MG/ML IJ SOLN
INTRAMUSCULAR | Status: AC
  Filled 2016-04-01: qty 1

## 2016-04-01 MED ORDER — ACETAMINOPHEN 650 MG RE SUPP: 650.0000 mg | RECTAL | Status: DC | PRN

## 2016-04-01 MED ORDER — SODIUM CHLORIDE 0.9% FLUSH
3.0000 mL | Freq: Two times a day (BID) | INTRAVENOUS | Status: DC
  Administered 2016-04-01 – 2016-04-02 (×3): 3 mL via INTRAVENOUS

## 2016-04-01 MED ORDER — EPHEDRINE 5 MG/ML INJ
INTRAVENOUS | Status: AC
  Filled 2016-04-01: qty 10

## 2016-04-01 MED ORDER — KETOROLAC TROMETHAMINE 30 MG/ML IJ SOLN
15.0000 mg | Freq: Four times a day (QID) | INTRAMUSCULAR | Status: DC
Start: 2016-04-01 — End: 2016-04-02
  Administered 2016-04-01 – 2016-04-02 (×4): 15 mg via INTRAVENOUS
  Filled 2016-04-01 (×4): qty 1

## 2016-04-01 MED ORDER — EPHEDRINE SULFATE 50 MG/ML IJ SOLN
INTRAMUSCULAR | Status: DC | PRN
  Administered 2016-04-01: 10 mg via INTRAVENOUS
  Administered 2016-04-01 (×2): 5 mg via INTRAVENOUS
  Administered 2016-04-01 (×2): 10 mg via INTRAVENOUS

## 2016-04-01 MED ORDER — HYDROCODONE-ACETAMINOPHEN 5-325 MG PO TABS
1.0000 | ORAL_TABLET | ORAL | Status: DC | PRN
  Administered 2016-04-01: 2 via ORAL
  Filled 2016-04-01: qty 2

## 2016-04-01 SURGICAL SUPPLY — 77 items
BAG DECANTER FOR FLEXI CONT (MISCELLANEOUS) ×4 IMPLANT
BLADE CLIPPER SURG (BLADE) IMPLANT
BRUSH SCRUB EZ PLAIN DRY (MISCELLANEOUS) ×2 IMPLANT
BUR ACRON 5.0MM COATED (BURR) ×4 IMPLANT
BUR MATCHSTICK NEURO 3.0 LAGG (BURR) ×2 IMPLANT
CANISTER SUCT 3000ML PPV (MISCELLANEOUS) ×2 IMPLANT
CAP LCK SPNE (Orthopedic Implant) ×6 IMPLANT
CAP LOCK SPINE RADIUS (Orthopedic Implant) ×6 IMPLANT
CAP LOCKING (Orthopedic Implant) ×6 IMPLANT
CONT SPEC 4OZ CLIKSEAL STRL BL (MISCELLANEOUS) ×2 IMPLANT
COVER BACK TABLE 60X90IN (DRAPES) ×2 IMPLANT
CROSSLINK VARIABLE M-A (Orthopedic Implant) ×2 IMPLANT
DERMABOND ADVANCED (GAUZE/BANDAGES/DRESSINGS) ×1
DERMABOND ADVANCED .7 DNX12 (GAUZE/BANDAGES/DRESSINGS) ×1 IMPLANT
DRAPE C-ARM 42X72 X-RAY (DRAPES) ×4 IMPLANT
DRAPE LAPAROTOMY 100X72X124 (DRAPES) ×2 IMPLANT
DRAPE POUCH INSTRU U-SHP 10X18 (DRAPES) ×2 IMPLANT
DRAPE PROXIMA HALF (DRAPES) IMPLANT
DRSG EMULSION OIL 3X3 NADH (GAUZE/BANDAGES/DRESSINGS) IMPLANT
ELECT BLADE 4.0 EZ CLEAN MEGAD (MISCELLANEOUS) ×2
ELECT REM PT RETURN 9FT ADLT (ELECTROSURGICAL) ×2
ELECTRODE BLDE 4.0 EZ CLN MEGD (MISCELLANEOUS) ×1 IMPLANT
ELECTRODE REM PT RTRN 9FT ADLT (ELECTROSURGICAL) ×1 IMPLANT
GAUZE SPONGE 4X4 12PLY STRL (GAUZE/BANDAGES/DRESSINGS) ×2 IMPLANT
GAUZE SPONGE 4X4 16PLY XRAY LF (GAUZE/BANDAGES/DRESSINGS) IMPLANT
GLOVE BIO SURGEON STRL SZ7 (GLOVE) ×4 IMPLANT
GLOVE BIOGEL PI IND STRL 7.0 (GLOVE) ×2 IMPLANT
GLOVE BIOGEL PI IND STRL 7.5 (GLOVE) ×3 IMPLANT
GLOVE BIOGEL PI IND STRL 8 (GLOVE) ×2 IMPLANT
GLOVE BIOGEL PI INDICATOR 7.0 (GLOVE) ×2
GLOVE BIOGEL PI INDICATOR 7.5 (GLOVE) ×3
GLOVE BIOGEL PI INDICATOR 8 (GLOVE) ×2
GLOVE ECLIPSE 7.5 STRL STRAW (GLOVE) ×4 IMPLANT
GLOVE EXAM NITRILE LRG STRL (GLOVE) IMPLANT
GLOVE EXAM NITRILE MD LF STRL (GLOVE) IMPLANT
GLOVE EXAM NITRILE XL STR (GLOVE) IMPLANT
GLOVE EXAM NITRILE XS STR PU (GLOVE) IMPLANT
GOWN STRL REUS W/ TWL LRG LVL3 (GOWN DISPOSABLE) ×2 IMPLANT
GOWN STRL REUS W/ TWL XL LVL3 (GOWN DISPOSABLE) ×2 IMPLANT
GOWN STRL REUS W/TWL 2XL LVL3 (GOWN DISPOSABLE) IMPLANT
GOWN STRL REUS W/TWL LRG LVL3 (GOWN DISPOSABLE) ×2
GOWN STRL REUS W/TWL XL LVL3 (GOWN DISPOSABLE) ×2
HEMOSTAT POWDER KIT SURGIFOAM (HEMOSTASIS) ×2 IMPLANT
KIT BASIN OR (CUSTOM PROCEDURE TRAY) ×2 IMPLANT
KIT INFUSE MEDIUM (Orthopedic Implant) ×2 IMPLANT
KIT ROOM TURNOVER OR (KITS) ×2 IMPLANT
MILL MEDIUM DISP (BLADE) IMPLANT
NEEDLE BONE MARROW 8GAX6 (NEEDLE) ×2 IMPLANT
NEEDLE SPNL 18GX3.5 QUINCKE PK (NEEDLE) ×2 IMPLANT
NEEDLE SPNL 22GX3.5 QUINCKE BK (NEEDLE) ×4 IMPLANT
NS IRRIG 1000ML POUR BTL (IV SOLUTION) ×6 IMPLANT
PACK LAMINECTOMY NEURO (CUSTOM PROCEDURE TRAY) ×2 IMPLANT
PAD ARMBOARD 7.5X6 YLW CONV (MISCELLANEOUS) ×6 IMPLANT
PATTIES SURGICAL .5 X.5 (GAUZE/BANDAGES/DRESSINGS) IMPLANT
PATTIES SURGICAL .5 X1 (DISPOSABLE) ×2 IMPLANT
PATTIES SURGICAL 1X1 (DISPOSABLE) IMPLANT
PEEK PLIF AVS 10X20X4 (Peek) ×4 IMPLANT
ROD 5.5X60MM GREEN (Rod) ×4 IMPLANT
SCREW 5.75X40M (Screw) ×2 IMPLANT
SCREW 5.75X45MM (Screw) ×10 IMPLANT
SPONGE LAP 4X18 X RAY DECT (DISPOSABLE) IMPLANT
SPONGE NEURO XRAY DETECT 1X3 (DISPOSABLE) ×2 IMPLANT
SPONGE SURGIFOAM ABS GEL 100 (HEMOSTASIS) ×2 IMPLANT
STRIP BIOACTIVE VITOSS 25X100X (Neuro Prosthesis/Implant) ×2 IMPLANT
STRIP BIOACTIVE VITOSS 25X52X4 (Orthopedic Implant) ×2 IMPLANT
SUT VIC AB 1 CT1 18XBRD ANBCTR (SUTURE) ×1 IMPLANT
SUT VIC AB 1 CT1 8-18 (SUTURE) ×1
SUT VIC AB 2-0 CP2 18 (SUTURE) ×2 IMPLANT
SUT VIC AB 3-0 SH 8-18 (SUTURE) ×2 IMPLANT
SYR 3ML LL SCALE MARK (SYRINGE) ×8 IMPLANT
SYR CONTROL 10ML LL (SYRINGE) ×2 IMPLANT
TAPE CLOTH SURG 4X10 WHT LF (GAUZE/BANDAGES/DRESSINGS) ×2 IMPLANT
TOWEL OR 17X24 6PK STRL BLUE (TOWEL DISPOSABLE) ×2 IMPLANT
TOWEL OR 17X26 10 PK STRL BLUE (TOWEL DISPOSABLE) ×2 IMPLANT
TRAY FOLEY CATH 16FRSI W/METER (SET/KITS/TRAYS/PACK) ×2 IMPLANT
TRAY FOLEY W/METER SILVER 14FR (SET/KITS/TRAYS/PACK) IMPLANT
WATER STERILE IRR 1000ML POUR (IV SOLUTION) ×2 IMPLANT

## 2016-04-01 NOTE — H&P (Signed)
Subjective: Patient is a 75 y.o. right-handed black female who is admitted for treatment of multilevel multifactorial lumbar stenosis.  Patient's been having increasing difficulties with low back pain radiating into the buttocks and posterior thighs bilaterally. She's been undergoing treatment with epidural steroid injections since 2014, but they have become progressively less effective. She's been treated with Lyrica, which is helped some but she still has significant limitations. X-rays reveal a grade 2 dynamic degenerative spinal listhesis of L4 and L5, and MRI reveals marked multifactorial stenosis L3-4 and marked to severe multifactorial lumbar stenosis at L4-5. Patient admitted now for L3-L5 decompressive lumbar laminectomy, facetectomy and foraminotomy, L4-5 posterior lumbar interbody arthrodesis with interbody implants and bone graft, and L3-L5 posterior lateral arthrodesis with posterior instrumentation and bone graft.     Patient Active Problem List   Diagnosis Date Noted  . HNP (herniated nucleus pulposus) with myelopathy, cervical 11/30/2011   Past Medical History  Diagnosis Date  . Hypertension   . GERD (gastroesophageal reflux disease)   . Arthritis     Neck, knees, hands  . Dysrhythmia     "beating fast & flooding"    Past Surgical History  Procedure Laterality Date  . Joint replacement      bilateral knees,   . Abdominal hysterectomy    . Anterior cervical decomp/discectomy fusion  12/02/2011    Procedure: ANTERIOR CERVICAL DECOMPRESSION/DISCECTOMY FUSION 1 LEVEL;  Surgeon: Hosie Spangle;  Location: [place] [facility];  Service: Neurosurgery;  Laterality: N/A;  Cervical three - four Anterior Cervical Decompression Fusion    Prescriptions prior to admission  Medication Sig Dispense Refill Last Dose  . aspirin EC 81 MG tablet Take 81 mg by mouth daily.   Past Month at Unknown time  . Cholecalciferol (VITAMIN D-3) 1000 units CAPS Take 1,000 Units by mouth daily.   Past Week at  Unknown time  . ferrous sulfate 325 (65 FE) MG EC tablet Take 325 mg by mouth daily with breakfast.   Past Week at Unknown time  . furosemide (LASIX) 20 MG tablet Take 20 mg by mouth 2 (two) times a week.    Past Week at Unknown time  . hydrochlorothiazide (HYDRODIURIL) 50 MG tablet Take 50 mg by mouth daily.    Past Week at Unknown time  . LYRICA 50 MG capsule Take 50 mg by mouth 2 (two) times daily.   Past Week at Unknown time  . metoprolol (LOPRESSOR) 50 MG tablet Take 50 mg by mouth 2 (two) times daily.     04/01/2016 at 0430  . Multiple Vitamin (MULTIVITAMIN) tablet Take 1 tablet by mouth daily.   Past Week at Unknown time  . simvastatin (ZOCOR) 40 MG tablet Take 40 mg by mouth every evening.     03/31/2016 at Unknown time  . traMADol (ULTRAM) 50 MG tablet Take 50 mg by mouth 2 (two) times daily.    03/31/2016 at Unknown time   Allergies  Allergen Reactions  . Advil [Ibuprofen] Other (See Comments)    "Gives me the shakes"  . Prilosec [Omeprazole] Other (See Comments)    Constipation  . Tylenol [Acetaminophen] Other (See Comments)    Shakes    Social History  Substance Use Topics  . Smoking status: Never Smoker   . Smokeless tobacco: Not on file  . Alcohol Use: No    Family History  Problem Relation Age of Onset  . Hypertension Mother      Review of Systems A comprehensive review of systems was  negative.  Objective: Vital signs in last 24 hours: Temp:  [98.5 F (36.9 C)] 98.5 F (36.9 C) (05/08 0618) Pulse Rate:  [58] 58 (05/08 0618) Resp:  [18] 18 (05/08 0618) BP: (178)/(66) 178/66 mmHg (05/08 0618) SpO2:  [100 %] 100 % (05/08 0618)  EXAM: Patient well-developed well-nourished white female in no acute distress. Lungs are clear to auscultation , the patient has symmetrical respiratory excursion. Heart has a regular rate and rhythm normal S1 and S2 no murmur.   Abdomen is soft nontender nondistended bowel sounds are present. Extremity examination shows no clubbing cyanosis or  edema. Motor examination shows 5 over 5 strength in the lower extremities including the iliopsoas quadriceps dorsiflexor extensor hallicus  longus and plantar flexor bilaterally. Sensation is intact to pinprick in the distal lower extremities. Reflexes are symmetrical bilaterally. No pathologic reflexes are present. Patient has a normal gait and stance.   Data Review:CBC    Component Value Date/Time   WBC 4.8 03/26/2016 0849   WBC 5.3 12/18/2012 1152   RBC 4.14 03/26/2016 0849   RBC 4.21 12/18/2012 1152   HGB 12.5 03/26/2016 0849   HGB 12.8 12/18/2012 1152   HCT 38.8 03/26/2016 0849   HCT 38.3 12/18/2012 1152   PLT 184 03/26/2016 0849   PLT 190 12/18/2012 1152   MCV 93.7 03/26/2016 0849   MCV 91 12/18/2012 1152   MCH 30.2 03/26/2016 0849   MCH 30.4 12/18/2012 1152   MCHC 32.2 03/26/2016 0849   MCHC 33.4 12/18/2012 1152   RDW 13.7 03/26/2016 0849   RDW 14.1 12/18/2012 1152   LYMPHSABS 1.5 12/01/2011 0540   LYMPHSABS 2.7 11/30/2011 0418   MONOABS 0.2 12/01/2011 0540   MONOABS 0.5 11/30/2011 0418   EOSABS 0.0 12/01/2011 0540   EOSABS 0.1 11/30/2011 0418   BASOSABS 0.0 12/01/2011 0540   BASOSABS 0.0 11/30/2011 0418                          BMET    Component Value Date/Time   NA 141 03/26/2016 0849   NA 144 11/30/2011 0418   K 3.6 03/26/2016 0849   K 3.6 11/30/2011 0418   CL 103 03/26/2016 0849   CL 105 11/30/2011 0418   CO2 29 03/26/2016 0849   CO2 29 11/30/2011 0418   GLUCOSE 100* 03/26/2016 0849   GLUCOSE 102* 11/30/2011 0418   BUN 15 03/26/2016 0849   BUN 19* 11/30/2011 0418   CREATININE 0.94 03/26/2016 0849   CREATININE 0.98 11/30/2011 0418   CALCIUM 9.3 03/26/2016 0849   CALCIUM 8.6 11/30/2011 0418   GFRNONAA 57* 03/26/2016 0849   GFRNONAA 59* 11/30/2011 0418   GFRAA >60 03/26/2016 0849   GFRAA >60 11/30/2011 0418     Assessment/Plan: Patient with neurogenic claudication secondary to multilevel multifactorial lumbar stenosis with an associated grade 2  dynamic degenerative spondylolisthesis, who is admitted now for lumbar decompression and stabilization.  I've discussed with the patient the nature of his condition, the nature the surgical procedure, the typical length of surgery, hospital stay, and overall recuperation, the limitations postoperatively, and risks of surgery. I discussed risks including risks of infection, bleeding, possibly need for transfusion, the risk of nerve root dysfunction with pain, weakness, numbness, or paresthesias, the risk of dural tear and CSF leakage and possible need for further surgery, the risk of failure of the arthrodesis and possibly for further surgery, the risk of anesthetic complications including myocardial infarction, stroke, pneumonia, and death.  We discussed the need for postoperative immobilization in a lumbar brace. Understanding all this the patient does wish to proceed with surgery and is admitted for such.     Hosie Spangle, MD 04/01/2016 7:19 AM

## 2016-04-01 NOTE — Op Note (Signed)
04/01/2016  12:50 PM  PATIENT:  Eileen Norris  75 y.o. 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, lumbar spondylosis, lumbar degenerative disc disease  PROCEDURE:  Procedure(s):  L3-L5 lumbar decompression including bilateral laminectomy, facetectomy, and foraminotomies for the stenotic compression of the L3, L4, and L5 nerve roots bilaterally, with decompression beyond that required for interbody arthrodesis; bilateral L4-5 posterior lumbar interbody arthrodesis with AVS peek interbody implants, Vitoss BA with bone marrow aspirate, and infuse; bilateral L3-L5 posterior lateral arthrodesis with segmental radius posterior instrumentation, locally harvested morcellized autograft, Vitoss BA with bone marrow aspirate, and infuse  SURGEON:  Surgeon(s): Jovita Gamma, MD Tamala Fothergill, MD  ASSISTANTS: Daun Peacock, M.D.  ANESTHESIA:   general  EBL:  Total I/O In: 1140 [I.V.:1000; Blood:140] Out: 1000 [Urine:750; Blood:250]  BLOOD ADMINISTERED:140 CC CELLSAVER  COUNT: Correct per nursing staff  DICTATION:  Patient was brought to the operating room placed under general endotracheal anesthesia. The patient was turned to prone position, the lumbar region was prepped with Betadine soap and solution and draped in a sterile fashion. The midline was infiltrated with local anesthesia with epinephrine. A midline incision was made and carried down through the subcutaneous tissue, bipolar cautery and electrocautery were used to maintain hemostasis. Dissection was carried down to the lumbar fascia. The fascia was incised bilaterally and the paraspinal muscles were dissected  with a spinous process and lamina in a subperiosteal fashion. An x-ray was taken for localization and the 3, L4, and L5 levels were localized. Dissection was then carried out laterally over the facet complexes and the transverse processes of L3, L4, and L5 were exposed and decorticated. Bilateral L3, L4, and L5 decompressive lumbar laminectomy was performed using the high-speed drill and Kerrison punches. There was marked stenosis of the spinal canal and thecal sac, and good decompression was achieved. Dissection was carried out laterally including facetectomy and foraminotomies with decompression of the stenotic compression of the L3, L4, and L5 nerve roots. Once the decompression of the stenotic compression of the thecal sac and exiting nerve roots was completed we proceeded with the posterior lumbar interbody arthrodesis. The annulus at the L4-5 level was incised bilaterally and the disc space entered. A thorough discectomy was performed using pituitary rongeurs and curettes. The spondylolisthesis was noted. Once the discectomy was completed we began to prepare the endplate surfaces. We then measured the height of the intervertebral disc space. We selected 10 x 20 x 4 AVS peek interbody implants.  The C-arm fluoroscope was then draped and brought in the field and we identified the pedicle entry points bilaterally at the L3, L4, and L5 levels. Each of the 6 pedicles was probed, we aspirated bone marrow aspirate from the vertebral bodies, this was injected over a 10 cc and a 5 cc strip of Vitoss BA. Then each of the pedicles was examined with the ball probe, good bony surfaces were found and no bony cuts were found. Each of the pedicles was then tapped with a 5.25 mm tap, again examined with the ball probe good threading was found and no bony cuts were found. We then placed 5.75 by 45 millimeter screws bilaterally at the L3 level, 5.75 by 45 millimeter screws bilaterally at the L4 level, and 5.75 by 45  millimeter screw on  the left side at the L5 level and a 5.75 x 40 mm screw on the right side at L5.  We then packed the AVS peek interbody implants with Vitoss BA with bone marrow aspirate and infuse, and then placed the first implant at the L4-5 level on the right side, carefully retracting the thecal sac and nerve root medially. We then went back to the left side and packed the midline with additional Vitoss BA with bone marrow aspirate and infuse, and then placed a second implant on the left side again retracting the thecal sac and nerve root medially. Additional Vitoss BA with bone marrow aspirate and infuse was packed lateral to the implants.    We then packed the lateral gutter over the transverse processes and intertransverse space with Vitoss BA with bone marrow aspirate and infuse. We then selected 60 mm pre-lordosed rods, they were placed within the screw heads and secured with locking caps once all 6 locking caps were placed final tightening was performed against a counter torque. We then placed a variable medium angled cross connector between the L4 and L5 screws on each side. It was locked down to the rods on each side, and then each of the central locks were tightened down.  The wound had been irrigated multiple times during the procedure with saline solution and bacitracin solution, good hemostasis was established with a combination of bipolar cautery and Gelfoam with thrombin. The Gelfoam was removed, and a thin layer of Surgifoam was applied. Once good hemostasis was confirmed we proceeded with closure paraspinal muscles deep fascia and Scarpa's fascia were closed with interrupted undyed 1 Vicryl sutures the subcutaneous and subcuticular closed with interrupted inverted 2-0 undyed Vicryl sutures the skin edges were approximated with Dermabond.  The wound was dressed with sterile gauze and Hypafix.  Following surgery the patient was turned back to the supine position to be reversed and the  anesthetic extubated and transferred to the recovery room for further care.   PLAN OF CARE: Admit for overnight observation  PATIENT DISPOSITION:  PACU - hemodynamically stable.   Delay start of Pharmacological VTE agent (>24hrs) due to surgical blood loss or risk of bleeding:  yes

## 2016-04-01 NOTE — Anesthesia Postprocedure Evaluation (Signed)
Anesthesia Post Note  Patient: Eileen Norris  Procedure(s) Performed: Procedure(s) (LRB): Lumbar three-four, Lumbar four-five decompressive laminectomy with Lumbar four-five posterior lumbar interbody fusion with interbody prosthesis, posterior lateral arthrodesis Lumbar three-five and posterior segmental instrumentation (N/A)  Patient location during evaluation: PACU Anesthesia Type: General Level of consciousness: awake, sedated and patient cooperative Pain management: pain level controlled Vital Signs Assessment: post-procedure vital signs reviewed and stable Respiratory status: spontaneous breathing and respiratory function stable Cardiovascular status: blood pressure returned to baseline and stable Anesthetic complications: no    Last Vitals:  Filed Vitals:   04/01/16 1330 04/01/16 1335  BP: 156/70   Pulse: 59 60  Temp:  36.4 C  Resp: 40 22    Last Pain:  Filed Vitals:   04/01/16 1410  PainSc: 1       LLE Sensation: Full sensation (04/01/16 1410)   RLE Sensation: Full sensation (04/01/16 1410)      Dantae Meunier EDWARD

## 2016-04-01 NOTE — Transfer of Care (Signed)
Immediate Anesthesia Transfer of Care Note  Patient: Eileen Norris  Procedure(s) Performed: Procedure(s): Lumbar three-four, Lumbar four-five decompressive laminectomy with Lumbar four-five posterior lumbar interbody fusion with interbody prosthesis, posterior lateral arthrodesis Lumbar three-five and posterior segmental instrumentation (N/A)  Patient Location: PACU  Anesthesia Type:General  Level of Consciousness: awake, alert , oriented and patient cooperative  Airway & Oxygen Therapy: Patient Spontanous Breathing and Patient connected to nasal cannula oxygen  Post-op Assessment: Report given to RN and Post -op Vital signs reviewed and stable  Post vital signs: Reviewed and stable  Last Vitals:  Filed Vitals:   04/01/16 0618  BP: 178/66  Pulse: 58  Temp: 36.9 C  Resp: 18    Last Pain:  Filed Vitals:   04/01/16 0620  PainSc: 10-Worst pain ever         Complications: No apparent anesthesia complications

## 2016-04-01 NOTE — Progress Notes (Signed)
Filed Vitals:   04/01/16 1330 04/01/16 1335 04/01/16 1350 04/01/16 1625  BP: 156/70  159/66 145/58  Pulse: 59 60 57 67  Temp:  97.5 F (36.4 C) 97.5 F (36.4 C) 98.1 F (36.7 C)  Resp: 40 22 20 20   SpO2: 100% 97% 98% 100%    Patient resting in bed, ambulated with staff in the halls. Foley to straight drainage, will have staff remove later this evening, and monitor voiding function. Dressing clean and dry.  Plan: Patient asking for tramadol rather than opioid for pain, ordered. Encouraged to ambulate in the halls with staff. Continue to progress through postoperative recovery.  Hosie Spangle, MD 04/01/2016, 6:09 PM

## 2016-04-01 NOTE — Progress Notes (Signed)
Utilization review completed.  

## 2016-04-01 NOTE — Anesthesia Preprocedure Evaluation (Signed)
Anesthesia Evaluation  Patient identified by MRN, date of birth, ID band Patient awake    Reviewed: Allergy & Precautions, NPO status , Patient's Chart, lab work & pertinent test results, reviewed documented beta blocker date and time   Airway Mallampati: I  TM Distance: >3 FB Neck ROM: Limited    Dental   Pulmonary    Pulmonary exam normal        Cardiovascular hypertension, Normal cardiovascular exam+ dysrhythmias  Rhythm:Regular Rate:Normal     Neuro/Psych    GI/Hepatic GERD  ,  Endo/Other    Renal/GU      Musculoskeletal  (+) Arthritis ,   Abdominal   Peds  Hematology   Anesthesia Other Findings   Reproductive/Obstetrics                             Anesthesia Physical Anesthesia Plan  ASA: II  Anesthesia Plan: General   Post-op Pain Management:    Induction: Intravenous  Airway Management Planned: Oral ETT  Additional Equipment:   Intra-op Plan:   Post-operative Plan: Extubation in OR  Informed Consent: I have reviewed the patients History and Physical, chart, labs and discussed the procedure including the risks, benefits and alternatives for the proposed anesthesia with the patient or authorized representative who has indicated his/her understanding and acceptance.     Plan Discussed with: CRNA  Anesthesia Plan Comments:         Anesthesia Quick Evaluation

## 2016-04-02 MED ORDER — TRAMADOL HCL 50 MG PO TABS: 50.0000 mg | ORAL_TABLET | Freq: Four times a day (QID) | ORAL | Status: DC | PRN

## 2016-04-02 MED FILL — Sodium Chloride IV Soln 0.9%: INTRAVENOUS | Qty: 1000 | Status: AC

## 2016-04-02 NOTE — Progress Notes (Signed)
Pt doing well. Pt and son given D/C instructions with Rx, verbal understanding was provided. Pt's dressing was removed prior to D/C per MD order. Pt's IV was removed prior to D/C. Pt D/C'd home via wheelchair @ 1705 per MD order. Pt is stable @ D/C and has no other needs at this time. Holli Humbles, RN

## 2016-04-02 NOTE — Discharge Summary (Signed)
Physician Discharge Summary  Patient ID: Eileen Norris MRN: [id number] DOB/AGE: 77-20-1942 77 y.o.  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 Postoperatively she is doing well she is up and ambulating actively in the halls. She is using very little in the way of pain medication. Her dressing is to be removed by the nursing staff prior to discharge. She has been given instructions regarding wound care and activities following discharge. She is scheduled follow-up with me in the office in about 3 weeks.  Discharge Exam: Blood pressure 104/43, pulse 66, temperature 98.4 F (36.9 C), temperature source Oral, resp. rate 18, SpO2 99 %.  Disposition: 06-Home-Health Care Svc     Medication List    TAKE these medications        aspirin EC 81 MG tablet  Take 81 mg by mouth daily.     ferrous sulfate 325 (65 FE) MG EC tablet  Take 325 mg by mouth daily with breakfast.     furosemide 20 MG tablet  Commonly known as:  LASIX  Take 20 mg by mouth 2 (two) times a week.     hydrochlorothiazide 50 MG tablet  Commonly known as:  HYDRODIURIL  Take 50 mg by mouth daily.     LYRICA 50 MG capsule  Generic drug:  pregabalin  Take 50 mg by mouth 2 (two) times daily.     metoprolol 50 MG tablet  Commonly known as:  LOPRESSOR  Take 50 mg by mouth 2  (two) times daily.     multivitamin tablet  Take 1 tablet by mouth daily.     simvastatin 40 MG tablet  Commonly known as:  ZOCOR  Take 40 mg by mouth every evening.     traMADol 50 MG tablet  Commonly known as:  ULTRAM  Take 50 mg by mouth 2 (two) times daily.     traMADol 50 MG tablet  Commonly known as:  ULTRAM  Take 1 tablet (50 mg total) by mouth every 6 (six) hours as needed for moderate pain.     Vitamin D-3 1000 units Caps  Take 1,000 Units by mouth daily.         SignedHosie Spangle 04/02/2016, 8:12 AM

## 2016-04-02 NOTE — Discharge Instructions (Signed)

## 2016-04-23 DIAGNOSIS — M4806 Spinal stenosis, lumbar region: Secondary | ICD-10-CM | POA: Diagnosis not present

## 2016-04-23 DIAGNOSIS — M4726 Other spondylosis with radiculopathy, lumbar region: Secondary | ICD-10-CM | POA: Diagnosis not present

## 2016-05-14 DIAGNOSIS — R002 Palpitations: Secondary | ICD-10-CM | POA: Diagnosis not present

## 2016-05-14 DIAGNOSIS — I471 Supraventricular tachycardia: Secondary | ICD-10-CM | POA: Diagnosis not present

## 2016-05-14 DIAGNOSIS — E784 Other hyperlipidemia: Secondary | ICD-10-CM | POA: Diagnosis not present

## 2016-07-03 DIAGNOSIS — H25813 Combined forms of age-related cataract, bilateral: Secondary | ICD-10-CM | POA: Diagnosis not present

## 2016-08-06 DIAGNOSIS — Z981 Arthrodesis status: Secondary | ICD-10-CM | POA: Diagnosis not present

## 2016-08-06 DIAGNOSIS — M4726 Other spondylosis with radiculopathy, lumbar region: Secondary | ICD-10-CM | POA: Diagnosis not present

## 2016-08-06 DIAGNOSIS — M5136 Other intervertebral disc degeneration, lumbar region: Secondary | ICD-10-CM | POA: Diagnosis not present

## 2016-08-08 DIAGNOSIS — Z96651 Presence of right artificial knee joint: Secondary | ICD-10-CM | POA: Diagnosis not present

## 2016-08-08 DIAGNOSIS — Z96652 Presence of left artificial knee joint: Secondary | ICD-10-CM | POA: Diagnosis not present

## 2016-08-11 DIAGNOSIS — Z96659 Presence of unspecified artificial knee joint: Secondary | ICD-10-CM | POA: Insufficient documentation

## 2016-08-14 DIAGNOSIS — I471 Supraventricular tachycardia: Secondary | ICD-10-CM | POA: Diagnosis not present

## 2016-08-14 DIAGNOSIS — E784 Other hyperlipidemia: Secondary | ICD-10-CM | POA: Diagnosis not present

## 2016-08-14 DIAGNOSIS — T466X5A Adverse effect of antihyperlipidemic and antiarteriosclerotic drugs, initial encounter: Secondary | ICD-10-CM | POA: Diagnosis not present

## 2016-08-14 DIAGNOSIS — M791 Myalgia: Secondary | ICD-10-CM | POA: Diagnosis not present

## 2016-10-09 DIAGNOSIS — Q7649 Other congenital malformations of spine, not associated with scoliosis: Secondary | ICD-10-CM | POA: Diagnosis not present

## 2016-10-09 DIAGNOSIS — R002 Palpitations: Secondary | ICD-10-CM | POA: Diagnosis not present

## 2016-10-09 DIAGNOSIS — R609 Edema, unspecified: Secondary | ICD-10-CM | POA: Diagnosis not present

## 2016-10-09 DIAGNOSIS — S43409A Unspecified sprain of unspecified shoulder joint, initial encounter: Secondary | ICD-10-CM | POA: Diagnosis not present

## 2016-11-12 DIAGNOSIS — Z981 Arthrodesis status: Secondary | ICD-10-CM | POA: Diagnosis not present

## 2016-11-12 DIAGNOSIS — M4726 Other spondylosis with radiculopathy, lumbar region: Secondary | ICD-10-CM | POA: Diagnosis not present

## 2016-11-12 DIAGNOSIS — M5136 Other intervertebral disc degeneration, lumbar region: Secondary | ICD-10-CM | POA: Diagnosis not present

## 2016-11-12 DIAGNOSIS — M4316 Spondylolisthesis, lumbar region: Secondary | ICD-10-CM | POA: Diagnosis not present

## 2017-01-15 DIAGNOSIS — Z96653 Presence of artificial knee joint, bilateral: Secondary | ICD-10-CM | POA: Diagnosis not present

## 2017-01-15 DIAGNOSIS — R609 Edema, unspecified: Secondary | ICD-10-CM | POA: Diagnosis not present

## 2017-01-15 DIAGNOSIS — R002 Palpitations: Secondary | ICD-10-CM | POA: Diagnosis not present

## 2017-01-15 DIAGNOSIS — Q7649 Other congenital malformations of spine, not associated with scoliosis: Secondary | ICD-10-CM | POA: Diagnosis not present

## 2017-01-16 DIAGNOSIS — R5381 Other malaise: Secondary | ICD-10-CM | POA: Diagnosis not present

## 2017-01-16 DIAGNOSIS — I10 Essential (primary) hypertension: Secondary | ICD-10-CM | POA: Diagnosis not present

## 2017-01-16 DIAGNOSIS — E784 Other hyperlipidemia: Secondary | ICD-10-CM | POA: Diagnosis not present

## 2017-02-10 DIAGNOSIS — S43409A Unspecified sprain of unspecified shoulder joint, initial encounter: Secondary | ICD-10-CM | POA: Diagnosis not present

## 2017-02-10 DIAGNOSIS — Z96653 Presence of artificial knee joint, bilateral: Secondary | ICD-10-CM | POA: Diagnosis not present

## 2017-02-10 DIAGNOSIS — E784 Other hyperlipidemia: Secondary | ICD-10-CM | POA: Diagnosis not present

## 2017-02-10 DIAGNOSIS — I471 Supraventricular tachycardia: Secondary | ICD-10-CM | POA: Diagnosis not present

## 2017-02-28 ENCOUNTER — Ambulatory Visit (INDEPENDENT_AMBULATORY_CARE_PROVIDER_SITE_OTHER): Payer: PPO

## 2017-02-28 ENCOUNTER — Ambulatory Visit (INDEPENDENT_AMBULATORY_CARE_PROVIDER_SITE_OTHER): Payer: PPO | Admitting: Podiatry

## 2017-02-28 VITALS — BP 109/59 | HR 58 | Resp 16

## 2017-02-28 DIAGNOSIS — M79671 Pain in right foot: Secondary | ICD-10-CM

## 2017-02-28 DIAGNOSIS — M79672 Pain in left foot: Secondary | ICD-10-CM | POA: Diagnosis not present

## 2017-02-28 DIAGNOSIS — M21611 Bunion of right foot: Secondary | ICD-10-CM | POA: Diagnosis not present

## 2017-02-28 NOTE — Progress Notes (Signed)
   Subjective:  Patient presents today for evaluation of a blister that developed over the past 2-3 weeks. This is a there was then and she denies trauma. She states that approximately 2-3 weeks ago the blister burst and she soaked it in Epsom salt which helped tremendously. Patient presents today for further treatment and evaluation    Objective/Physical Exam General: The patient is alert and oriented x3 in no acute distress.  Dermatology: Skin is warm, dry and supple bilateral lower extremities. Negative for open lesions or macerations.  Vascular: Palpable pedal pulses bilaterally. No edema or erythema noted. Capillary refill within normal limits.  Neurological: Epicritic and protective threshold grossly intact bilaterally.   Musculoskeletal Exam: Hallux abductovalgus with bunion deformity noted to the first MPJ right foot Radiographic exam: Midfoot degenerative changes noted throughout the right foot with an increased intermetatarsal angle consistent with hallux abductovalgus with bunion deformity.  Assessment: #1 hallux abductovalgus with bunion right #2 history of blister first MPJ right foot   Plan of Care:  #1 Patient was evaluated. #2 the patient has healed. The patient has a non-symptomatic bunion at the moment. Return to clinic when necessary   Edrick Kins, DPM [facility]  Dr. Edrick Kins, [address]                                        [place], [place] [address]                Office [phone]  Fax [phone]

## 2017-04-09 DIAGNOSIS — Z0000 Encounter for general adult medical examination without abnormal findings: Secondary | ICD-10-CM | POA: Diagnosis not present

## 2017-04-09 DIAGNOSIS — R0789 Other chest pain: Secondary | ICD-10-CM | POA: Diagnosis not present

## 2017-04-11 DIAGNOSIS — R2681 Unsteadiness on feet: Secondary | ICD-10-CM | POA: Diagnosis not present

## 2017-05-13 DIAGNOSIS — Z6835 Body mass index (BMI) 35.0-35.9, adult: Secondary | ICD-10-CM | POA: Diagnosis not present

## 2017-05-13 DIAGNOSIS — M5136 Other intervertebral disc degeneration, lumbar region: Secondary | ICD-10-CM | POA: Diagnosis not present

## 2017-05-13 DIAGNOSIS — M4726 Other spondylosis with radiculopathy, lumbar region: Secondary | ICD-10-CM | POA: Diagnosis not present

## 2017-05-13 DIAGNOSIS — Z981 Arthrodesis status: Secondary | ICD-10-CM | POA: Diagnosis not present

## 2017-05-20 DIAGNOSIS — R002 Palpitations: Secondary | ICD-10-CM | POA: Diagnosis not present

## 2017-05-20 DIAGNOSIS — E784 Other hyperlipidemia: Secondary | ICD-10-CM | POA: Diagnosis not present

## 2017-05-20 DIAGNOSIS — I471 Supraventricular tachycardia: Secondary | ICD-10-CM | POA: Diagnosis not present

## 2017-05-20 DIAGNOSIS — R0789 Other chest pain: Secondary | ICD-10-CM | POA: Diagnosis not present

## 2017-06-05 ENCOUNTER — Other Ambulatory Visit: Payer: Self-pay

## 2017-06-05 NOTE — Patient Outreach (Signed)
   Successful attempt made to contact patient for this HTA Screening Call. Patient denies any case management needs at this time. This RNCM informed patient she would be sending printed information listing [employer].

## 2017-07-24 DIAGNOSIS — Z96651 Presence of right artificial knee joint: Secondary | ICD-10-CM | POA: Diagnosis not present

## 2017-07-24 DIAGNOSIS — Z96652 Presence of left artificial knee joint: Secondary | ICD-10-CM | POA: Diagnosis not present

## 2017-10-06 DIAGNOSIS — H25813 Combined forms of age-related cataract, bilateral: Secondary | ICD-10-CM | POA: Diagnosis not present

## 2017-12-02 DIAGNOSIS — M25559 Pain in unspecified hip: Secondary | ICD-10-CM | POA: Diagnosis not present

## 2017-12-02 DIAGNOSIS — R200 Anesthesia of skin: Secondary | ICD-10-CM | POA: Diagnosis not present

## 2017-12-02 DIAGNOSIS — I471 Supraventricular tachycardia: Secondary | ICD-10-CM | POA: Diagnosis not present

## 2017-12-29 ENCOUNTER — Ambulatory Visit
Admission: RE | Admit: 2017-12-29 | Discharge: 2017-12-29 | Disposition: A | Discharge status: Home / Self Care | Payer: Medicare Other | Source: Ambulatory Visit | Attending: Internal Medicine | Admitting: Internal Medicine

## 2017-12-29 ENCOUNTER — Other Ambulatory Visit: Payer: Self-pay | Admitting: Internal Medicine

## 2017-12-29 DIAGNOSIS — M25551 Pain in right hip: Secondary | ICD-10-CM | POA: Insufficient documentation

## 2017-12-29 DIAGNOSIS — M47817 Spondylosis without myelopathy or radiculopathy, lumbosacral region: Secondary | ICD-10-CM | POA: Insufficient documentation

## 2017-12-29 DIAGNOSIS — Z981 Arthrodesis status: Secondary | ICD-10-CM | POA: Diagnosis not present

## 2017-12-29 DIAGNOSIS — M47816 Spondylosis without myelopathy or radiculopathy, lumbar region: Secondary | ICD-10-CM | POA: Diagnosis not present

## 2017-12-29 DIAGNOSIS — M545 Low back pain: Secondary | ICD-10-CM | POA: Diagnosis not present

## 2017-12-29 DIAGNOSIS — M47898 Other spondylosis, sacral and sacrococcygeal region: Secondary | ICD-10-CM | POA: Insufficient documentation

## 2017-12-29 DIAGNOSIS — M533 Sacrococcygeal disorders, not elsewhere classified: Secondary | ICD-10-CM | POA: Diagnosis not present

## 2017-12-29 DIAGNOSIS — R102 Pelvic and perineal pain: Secondary | ICD-10-CM | POA: Diagnosis not present

## 2018-01-05 DIAGNOSIS — E785 Hyperlipidemia, unspecified: Secondary | ICD-10-CM | POA: Diagnosis not present

## 2018-01-05 DIAGNOSIS — I471 Supraventricular tachycardia: Secondary | ICD-10-CM | POA: Diagnosis not present

## 2018-01-05 DIAGNOSIS — I10 Essential (primary) hypertension: Secondary | ICD-10-CM | POA: Diagnosis not present

## 2018-02-27 DIAGNOSIS — M4726 Other spondylosis with radiculopathy, lumbar region: Secondary | ICD-10-CM | POA: Diagnosis not present

## 2018-02-27 DIAGNOSIS — M5136 Other intervertebral disc degeneration, lumbar region: Secondary | ICD-10-CM | POA: Diagnosis not present

## 2018-02-27 DIAGNOSIS — Z981 Arthrodesis status: Secondary | ICD-10-CM | POA: Diagnosis not present

## 2018-02-27 DIAGNOSIS — M4316 Spondylolisthesis, lumbar region: Secondary | ICD-10-CM | POA: Diagnosis not present

## 2018-03-02 DIAGNOSIS — I10 Essential (primary) hypertension: Secondary | ICD-10-CM | POA: Diagnosis not present

## 2018-03-02 DIAGNOSIS — E785 Hyperlipidemia, unspecified: Secondary | ICD-10-CM | POA: Diagnosis not present

## 2018-03-02 DIAGNOSIS — I471 Supraventricular tachycardia: Secondary | ICD-10-CM | POA: Diagnosis not present

## 2018-04-08 DIAGNOSIS — B0221 Postherpetic geniculate ganglionitis: Secondary | ICD-10-CM | POA: Diagnosis not present

## 2018-04-08 DIAGNOSIS — M792 Neuralgia and neuritis, unspecified: Secondary | ICD-10-CM | POA: Diagnosis not present

## 2018-04-08 DIAGNOSIS — T8489XD Other specified complication of internal orthopedic prosthetic devices, implants and grafts, subsequent encounter: Secondary | ICD-10-CM | POA: Diagnosis not present

## 2018-04-08 DIAGNOSIS — Q7649 Other congenital malformations of spine, not associated with scoliosis: Secondary | ICD-10-CM | POA: Diagnosis not present

## 2018-04-14 DIAGNOSIS — I471 Supraventricular tachycardia: Secondary | ICD-10-CM | POA: Diagnosis not present

## 2018-04-14 DIAGNOSIS — B0223 Postherpetic polyneuropathy: Secondary | ICD-10-CM | POA: Diagnosis not present

## 2018-04-14 DIAGNOSIS — Q7649 Other congenital malformations of spine, not associated with scoliosis: Secondary | ICD-10-CM | POA: Diagnosis not present

## 2018-04-14 DIAGNOSIS — Z96653 Presence of artificial knee joint, bilateral: Secondary | ICD-10-CM | POA: Diagnosis not present

## 2018-05-13 DIAGNOSIS — B0223 Postherpetic polyneuropathy: Secondary | ICD-10-CM | POA: Diagnosis not present

## 2018-05-13 DIAGNOSIS — I471 Supraventricular tachycardia: Secondary | ICD-10-CM | POA: Diagnosis not present

## 2018-05-13 DIAGNOSIS — Q7649 Other congenital malformations of spine, not associated with scoliosis: Secondary | ICD-10-CM | POA: Diagnosis not present

## 2018-05-13 DIAGNOSIS — R002 Palpitations: Secondary | ICD-10-CM | POA: Diagnosis not present

## 2018-05-14 DIAGNOSIS — E119 Type 2 diabetes mellitus without complications: Secondary | ICD-10-CM | POA: Diagnosis not present

## 2018-05-14 DIAGNOSIS — E559 Vitamin D deficiency, unspecified: Secondary | ICD-10-CM | POA: Diagnosis not present

## 2018-05-14 DIAGNOSIS — E7849 Other hyperlipidemia: Secondary | ICD-10-CM | POA: Diagnosis not present

## 2018-05-14 DIAGNOSIS — I10 Essential (primary) hypertension: Secondary | ICD-10-CM | POA: Diagnosis not present

## 2018-05-14 DIAGNOSIS — R5381 Other malaise: Secondary | ICD-10-CM | POA: Diagnosis not present

## 2018-06-04 DIAGNOSIS — M6281 Muscle weakness (generalized): Secondary | ICD-10-CM | POA: Diagnosis not present

## 2018-06-04 DIAGNOSIS — M25562 Pain in left knee: Secondary | ICD-10-CM | POA: Diagnosis not present

## 2018-07-15 DIAGNOSIS — I471 Supraventricular tachycardia: Secondary | ICD-10-CM | POA: Diagnosis not present

## 2018-07-15 DIAGNOSIS — Q7649 Other congenital malformations of spine, not associated with scoliosis: Secondary | ICD-10-CM | POA: Diagnosis not present

## 2018-07-15 DIAGNOSIS — R002 Palpitations: Secondary | ICD-10-CM | POA: Diagnosis not present

## 2018-07-15 DIAGNOSIS — Z0000 Encounter for general adult medical examination without abnormal findings: Secondary | ICD-10-CM | POA: Diagnosis not present

## 2018-07-15 DIAGNOSIS — B0223 Postherpetic polyneuropathy: Secondary | ICD-10-CM | POA: Diagnosis not present

## 2018-07-16 DIAGNOSIS — R2689 Other abnormalities of gait and mobility: Secondary | ICD-10-CM | POA: Diagnosis not present

## 2018-07-16 DIAGNOSIS — Z96653 Presence of artificial knee joint, bilateral: Secondary | ICD-10-CM | POA: Diagnosis not present

## 2018-07-19 DIAGNOSIS — R2689 Other abnormalities of gait and mobility: Secondary | ICD-10-CM | POA: Insufficient documentation

## 2018-08-17 DIAGNOSIS — M542 Cervicalgia: Secondary | ICD-10-CM | POA: Insufficient documentation

## 2018-08-17 DIAGNOSIS — R262 Difficulty in walking, not elsewhere classified: Secondary | ICD-10-CM | POA: Insufficient documentation

## 2018-08-30 ENCOUNTER — Emergency Department
Admission: EM | Admit: 2018-08-30 | Discharge: 2018-08-31 | Disposition: A | Discharge status: Other Acute Inpatient Hospital | Payer: Medicare Other | Attending: Emergency Medicine | Admitting: Emergency Medicine

## 2018-08-30 ENCOUNTER — Emergency Department: Payer: Medicare Other

## 2018-08-30 DIAGNOSIS — M5412 Radiculopathy, cervical region: Secondary | ICD-10-CM | POA: Insufficient documentation

## 2018-08-30 DIAGNOSIS — S12600A Unspecified displaced fracture of seventh cervical vertebra, initial encounter for closed fracture: Secondary | ICD-10-CM | POA: Diagnosis not present

## 2018-08-30 DIAGNOSIS — S199XXA Unspecified injury of neck, initial encounter: Secondary | ICD-10-CM | POA: Diagnosis present

## 2018-08-30 DIAGNOSIS — Z96653 Presence of artificial knee joint, bilateral: Secondary | ICD-10-CM | POA: Diagnosis not present

## 2018-08-30 DIAGNOSIS — Y939 Activity, unspecified: Secondary | ICD-10-CM | POA: Diagnosis not present

## 2018-08-30 DIAGNOSIS — I10 Essential (primary) hypertension: Secondary | ICD-10-CM | POA: Diagnosis not present

## 2018-08-30 DIAGNOSIS — Y929 Unspecified place or not applicable: Secondary | ICD-10-CM | POA: Diagnosis not present

## 2018-08-30 DIAGNOSIS — M542 Cervicalgia: Secondary | ICD-10-CM | POA: Diagnosis not present

## 2018-08-30 DIAGNOSIS — Y999 Unspecified external cause status: Secondary | ICD-10-CM | POA: Insufficient documentation

## 2018-08-30 DIAGNOSIS — C720 Malignant neoplasm of spinal cord: Secondary | ICD-10-CM | POA: Diagnosis not present

## 2018-08-30 DIAGNOSIS — Z7982 Long term (current) use of aspirin: Secondary | ICD-10-CM | POA: Diagnosis not present

## 2018-08-30 DIAGNOSIS — Z79899 Other long term (current) drug therapy: Secondary | ICD-10-CM | POA: Diagnosis not present

## 2018-08-30 DIAGNOSIS — S12690A Other displaced fracture of seventh cervical vertebra, initial encounter for closed fracture: Secondary | ICD-10-CM | POA: Diagnosis not present

## 2018-08-30 DIAGNOSIS — Y33XXXA Other specified events, undetermined intent, initial encounter: Secondary | ICD-10-CM | POA: Insufficient documentation

## 2018-08-30 DIAGNOSIS — M25511 Pain in right shoulder: Secondary | ICD-10-CM | POA: Diagnosis not present

## 2018-08-30 DIAGNOSIS — N3000 Acute cystitis without hematuria: Secondary | ICD-10-CM

## 2018-08-30 DIAGNOSIS — M545 Low back pain: Secondary | ICD-10-CM | POA: Diagnosis not present

## 2018-08-30 LAB — COMPREHENSIVE METABOLIC PANEL
ALT: 10 U/L (ref 0–44)
AST: 18 U/L (ref 15–41)
Albumin: 4 g/dL (ref 3.5–5.0)
Alkaline Phosphatase: 101 U/L (ref 38–126)
Anion gap: 10 (ref 5–15)
BILIRUBIN TOTAL: 0.4 mg/dL (ref 0.3–1.2)
BUN: 23 mg/dL (ref 8–23)
CHLORIDE: 104 mmol/L (ref 98–111)
CO2: 25 mmol/L (ref 22–32)
CREATININE: 1.07 mg/dL — AB (ref 0.44–1.00)
Calcium: 9.8 mg/dL (ref 8.9–10.3)
GFR calc non Af Amer: 48 mL/min — ABNORMAL LOW (ref >60)
GFR, EST AFRICAN AMERICAN: 56 mL/min — AB (ref >60)
Glucose, Bld: 121 mg/dL — ABNORMAL HIGH (ref 70–99)
POTASSIUM: 4.6 mmol/L (ref 3.5–5.1)
Sodium: 139 mmol/L (ref 135–145)
TOTAL PROTEIN: 7.9 g/dL (ref 6.5–8.1)

## 2018-08-30 LAB — CBC
HCT: 33.2 % — ABNORMAL LOW (ref 35.0–47.0)
Hemoglobin: 11.3 g/dL — ABNORMAL LOW (ref 12.0–16.0)
MCH: 30.7 pg (ref 26.0–34.0)
MCHC: 34.1 g/dL (ref 32.0–36.0)
MCV: 90.3 fL (ref 80.0–100.0)
PLATELETS: 256 10*3/uL (ref 150–440)
RBC: 3.68 MIL/uL — ABNORMAL LOW (ref 3.80–5.20)
RDW: 13.7 % (ref 11.5–14.5)
WBC: 6.2 10*3/uL (ref 3.6–11.0)

## 2018-08-30 LAB — URINALYSIS, COMPLETE (UACMP) WITH MICROSCOPIC
Bilirubin Urine: NEGATIVE
GLUCOSE, UA: NEGATIVE mg/dL
Hgb urine dipstick: NEGATIVE
KETONES UR: NEGATIVE mg/dL
NITRITE: NEGATIVE
PH: 5 (ref 5.0–8.0)
Protein, ur: 30 mg/dL — AB
SPECIFIC GRAVITY, URINE: 1.015 (ref 1.005–1.030)

## 2018-08-30 MED ORDER — ACETAMINOPHEN 500 MG PO TABS
1000.0000 mg | ORAL_TABLET | Freq: Once | ORAL | Status: AC
  Administered 2018-08-30: 1000 mg via ORAL
  Filled 2018-08-30: qty 2

## 2018-08-30 MED ORDER — CEPHALEXIN 500 MG PO CAPS
500.0000 mg | ORAL_CAPSULE | Freq: Once | ORAL | Status: AC
  Administered 2018-08-30: 500 mg via ORAL
  Filled 2018-08-30: qty 1

## 2018-08-30 MED ORDER — DEXAMETHASONE SODIUM PHOSPHATE 10 MG/ML IJ SOLN
10.0000 mg | Freq: Once | INTRAMUSCULAR | Status: AC
  Administered 2018-08-30: 10 mg via INTRAMUSCULAR
  Filled 2018-08-30: qty 1

## 2018-08-30 NOTE — ED Notes (Signed)
Valuables given to  Daughter in law [name] to hold while pt having MRI

## 2018-08-30 NOTE — ED Notes (Signed)
Patient transported to MRI 

## 2018-08-30 NOTE — ED Notes (Signed)
Pt returned from MRI °

## 2018-08-30 NOTE — ED Notes (Signed)
Vaughan Basta, ed secretary notified of need for MRI.

## 2018-08-30 NOTE — ED Triage Notes (Signed)
Pt presents via POV c/o upper neck and back pain. Pt reports scheduled for "nerve testing" by PCP. Report pain with moving shoulders. Denies injury.

## 2018-08-30 NOTE — ED Notes (Signed)
Report given to Devan  Pt to be transferred to room 7

## 2018-08-30 NOTE — ED Provider Notes (Signed)
[place] EMERGENCY DEPARTMENT Provider Note   CSN: [id number] Arrival date & time: 08/30/18  1704     History   Chief Complaint Chief Complaint  Patient presents with  . Back Pain    HPI Eileen Norris is a 77 y.o. female presents to the emergency department for evaluation of neck, shoulder, right lower back pain.  Patient states she has had 4 weeks of neck pain radiating down into both arms with numbness.  Pain radiates down the superior scapular border, triceps and into the dorsal aspect of both forearms.  She has seen neurology, placed on gabapentin.  Nerve conduction studies have been ordered and are scheduled in 2 weeks.  She has a history of cervical spine surgery as well as lumbar spine surgery performed years ago.  She denies any trauma or injuries.  She has been on gabapentin for 2 weeks and not seeing any improvement.  She has not been taking Tylenol and has not been taking any other medications for symptoms.  Patient is ambulatory with a cane.  She denies any fevers, chest pain, shortness of breath. Patient does complain of right lower back pain, aching, no trauma or injury.  She denies any urinary symptoms.  She points to the distal ribs and superior pelvis of the right side.  She also complains of 4 weeks of numbness going down both legs along the anterior aspect of the thighs and lower leg.  Numbness is constant.  She denies any weakness or loss of bowel or bladder symptoms.  HPI  Past Medical History:  Diagnosis Date  . Arthritis    Neck, knees, hands  . Dysrhythmia    "beating fast & flooding"  . GERD (gastroesophageal reflux disease)   . Hypertension     Patient Active Problem List   Diagnosis Date Noted  . Lumbar stenosis with neurogenic claudication 04/01/2016  . 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 ABDOMINAL HYSTERECTOMY    . ANTERIOR CERVICAL  DECOMP/DISCECTOMY FUSION  12/02/2011   Procedure: ANTERIOR CERVICAL DECOMPRESSION/DISCECTOMY FUSION 1 LEVEL;  Surgeon: Hosie Spangle;  Location: [facility];  Service: Neurosurgery;  Laterality: N/A;  Cervical three - four Anterior Cervical Decompression Fusion  . JOINT REPLACEMENT     bilateral knees,      OB History   None      Home Medications    Prior to Admission medications   Medication Sig Start Date End Date Taking? Authorizing Provider  aspirin EC 81 MG tablet Take 81 mg by mouth daily.    [provider]  Cholecalciferol (VITAMIN D-3) 1000 units CAPS Take 1,000 Units by mouth daily.    [provider]  ferrous sulfate 325 (65 FE) MG EC tablet Take 325 mg by mouth daily with breakfast.    [provider]  furosemide (LASIX) 20 MG tablet Take 20 mg by mouth 2 (two) times a week.  01/04/16   [provider]  hydrochlorothiazide (HYDRODIURIL) 50 MG tablet Take 50 mg by mouth daily.  01/29/16   [provider]  lisinopril (PRINIVIL,ZESTRIL) 20 MG tablet Take 20 mg by mouth daily.    [provider]  meloxicam (MOBIC) 7.5 MG tablet Take 7.5 mg by mouth 2 times daily at 12 noon and 4 pm.    [provider]  metoprolol (LOPRESSOR) 50 MG tablet Take 50 mg by mouth 2 (two) times daily.  [provider]  Multiple Vitamin (MULTIVITAMIN) tablet Take 1 tablet by mouth daily.    [provider]  rosuvastatin (CRESTOR) 10 MG tablet  01/28/17   [provider]    Family History Family History  Problem Relation Age of Onset  . Hypertension Mother     Social History Social History   Tobacco Use  . Smoking status: Never Smoker  . Smokeless tobacco: Never Used  Substance Use Topics  . Alcohol use: No  . Drug use: No     Allergies   Advil [ibuprofen] and Prilosec [omeprazole]   Review of Systems Review of Systems  Constitutional: Negative for chills and fever.  Respiratory: Negative for  shortness of breath.   Cardiovascular: Negative for chest pain.  Musculoskeletal: Positive for back pain, neck pain and neck stiffness. Negative for arthralgias, gait problem, joint swelling and myalgias.  Skin: Negative for wound.  Neurological: Positive for numbness. Negative for weakness and headaches.     Physical Exam Updated Vital Signs BP (!) 133/59   Pulse 70   Temp 99.9 F (37.7 C) (Oral)   Resp 14   Wt 81.6 kg   SpO2 99%   BMI 31.39 kg/m   Physical Exam  Constitutional: She is oriented to person, place, and time. She appears well-developed and well-nourished.  HENT:  Head: Normocephalic and atraumatic.  Right Ear: External ear normal.  Left Ear: External ear normal.  Eyes: Conjunctivae are normal.  Neck: Normal range of motion.  Cardiovascular: Normal rate.  Pulmonary/Chest: Effort normal. No respiratory distress.  Musculoskeletal: Normal range of motion.  Mild tenderness along the cervical spinous process, no tenderness along the thoracic spinous process.  No tenderness along the lumbar spinous process.  She has mild right paravertebral muscle tenderness along the right flank along the distal ribs and superior iliac crest.  She has limited and painful range of motion of the cervical spine.  She has a positive Spurling's test reproducing bilateral shoulder and arm pain with cervical extension.  She has full grip strength, biceps and triceps strength bilaterally.  Sensation is intact distally.  Negative Tinel's and Phalen sign bilaterally.  She is good active range of motion of both hips knees and ankles with no discomfort.  No paravertebral muscle swelling along the spine.  No saddle anesthesia.  Neurological: She is alert and oriented to person, place, and time.  Skin: Skin is warm. No rash noted.  Psychiatric: She has a normal mood and affect. Her behavior is normal. Thought content normal.     ED Treatments / Results  Labs (all labs ordered are listed, but only  abnormal results are displayed) Labs Reviewed  CBC - Abnormal; Notable for the following components:      Result Value   RBC 3.68 (*)    Hemoglobin 11.3 (*)    HCT 33.2 (*)    All other components within normal limits  COMPREHENSIVE METABOLIC PANEL - Abnormal; Notable for the following components:   Glucose, Bld 121 (*)    Creatinine, Ser 1.07 (*)    GFR calc non Af Amer 48 (*)    GFR calc Af Amer 56 (*)    All other components within normal limits  URINALYSIS, COMPLETE (UACMP) WITH MICROSCOPIC - Abnormal; Notable for the following components:   Color, Urine YELLOW (*)    APPearance HAZY (*)    Protein, ur 30 (*)    Leukocytes, UA MODERATE (*)    Bacteria, UA RARE (*)  All other components within normal limits  URINE CULTURE    EKG None  Radiology Dg Cervical Spine 2-3 Views  Result Date: 08/30/2018 CLINICAL DATA:  Neck pain, prior fusion in 2013 EXAM: CERVICAL SPINE - 2-3 VIEW COMPARISON:  02/25/2012 FINDINGS: Prevertebral soft tissues normal thickness. Bones demineralized. Prior anterior fusion of C3-C4 with plate/screws and incorporated bone plugs. Superior endplate compression fracture of C7, new since 2013, with approximately 40% anterior height loss. Disc space narrowing with endplate spur formation at C5-C6 and C6-C7. Bulky anterior spurs at C2-C3 new since previous study. Scattered mild facet degenerative changes. No additional fracture or subluxation. IMPRESSION: Prior anterior fusion of C3-C4. New superior endplate compression fracture of C7 since 2013 with approximately 40% anterior height loss. Scattered degenerative disc and facet disease changes of the cervical spine. Electronically Signed   By: Lavonia Dana M.D.   On: 08/30/2018 18:50   Dg Lumbar Spine 2-3 Views  Result Date: 08/30/2018 CLINICAL DATA:  Back pain, fell 2 weeks ago, lumbar surgery 2017 EXAM: LUMBAR SPINE - 2-3 VIEW COMPARISON:  02/27/2018 FINDINGS: 5 non-rib-bearing lumbar vertebra. Bones  demineralized. BILATERAL pedicle screws and posterior bars L3-L5 with disc prosthesis at L4-L5. Hardware appears intact. Diffuse disc space narrowing. No fracture, subluxation or bone destruction. SI joints preserved. IMPRESSION: Postsurgical changes of posterior fusion L3-L5. Osseous demineralization with degenerative disc disease changes lumbar spine. No acute abnormalities. Electronically Signed   By: Lavonia Dana M.D.   On: 08/30/2018 18:46   Dg Shoulder Right  Result Date: 08/30/2018 CLINICAL DATA:  RIGHT shoulder pain EXAM: RIGHT SHOULDER - 2+ VIEW COMPARISON:  None FINDINGS: Osseous demineralization. Degenerative changes at [facility] joint with joint space narrowing and spur formation. No glenohumeral fracture or dislocation. Calcified bodies identified adjacent to proximal RIGHT humeral metadiaphysis. Visualized RIGHT ribs intact. IMPRESSION: Osseous demineralization with degenerative changes RIGHT AC joint. No acute abnormalities. Questionable calcified loose bodies adjacent to the proximal humerus. Electronically Signed   By: Lavonia Dana M.D.   On: 08/30/2018 18:43    Procedures Procedures (including critical care time)  Medications Ordered in ED Medications  cephALEXin (KEFLEX) capsule 500 mg (has no administration in time range)  dexamethasone (DECADRON) injection 10 mg (10 mg Intramuscular Given 08/30/18 1811)  acetaminophen (TYLENOL) tablet 1,000 mg (1,000 mg Oral Given 08/30/18 1811)     Initial Impression / Assessment and Plan / ED Course  I have reviewed the triage vital signs and the nursing notes.  Pertinent labs & imaging results that were available during my care of the patient were reviewed by me and considered in my medical decision making (see chart for details).     77 year old female with 4 weeks of cervicalgia and cervical radicular symptoms.  She is also had intermittent lumbar radicular symptoms with right-sided flank pain.  X-rays of the cervical spine, lumbar spine right  shoulder are concerning for age-indeterminate C7 compression fracture with 40% loss of height.  Due to chief complaint of neck pain with radicular symptoms MRI of the cervical spine is ordered.  Patient with no neurological deficits in the upper or lower extremities.  Radicular symptoms in the upper extremities and neck pain slightly improved with prednisone and Tylenol.  She is also noted to have some right flank pain.  CBC and BMP are normal, urinalysis showed concern for UTI.  Patient started on cephalexin.  MRI is pending, care transferred to Dr. Cinda Quest.  Final Clinical Impressions(s) / ED Diagnoses   Final diagnoses:  Compression fracture of  C7 vertebra, initial encounter [facility])  Cervical radiculopathy  Acute cystitis without hematuria    ED Discharge Orders    None       Renata Caprice 08/30/18 2138    Nena Polio, MD 08/31/18 [phone]

## 2018-08-30 NOTE — ED Notes (Signed)
SPOKE  TO  IRENE   IN LAB  HAS ENOUGH URINE   WILL ADD URINE  CULTURE

## 2018-08-31 ENCOUNTER — Other Ambulatory Visit: Payer: Self-pay

## 2018-08-31 ENCOUNTER — Inpatient Hospital Stay (HOSPITAL_COMMUNITY): Payer: Medicare Other

## 2018-08-31 ENCOUNTER — Encounter (HOSPITAL_COMMUNITY): Payer: Self-pay

## 2018-08-31 ENCOUNTER — Inpatient Hospital Stay (HOSPITAL_COMMUNITY)
Admission: AD | Admit: 2018-08-31 | Discharge: 2018-09-04 | DRG: 478 | Disposition: A | Discharge status: Home Health | Payer: Medicare Other | Source: Other Acute Inpatient Hospital | Attending: Internal Medicine | Admitting: Internal Medicine

## 2018-08-31 DIAGNOSIS — K219 Gastro-esophageal reflux disease without esophagitis: Secondary | ICD-10-CM | POA: Diagnosis present

## 2018-08-31 DIAGNOSIS — N390 Urinary tract infection, site not specified: Secondary | ICD-10-CM | POA: Diagnosis present

## 2018-08-31 DIAGNOSIS — G893 Neoplasm related pain (acute) (chronic): Secondary | ICD-10-CM | POA: Diagnosis not present

## 2018-08-31 DIAGNOSIS — C801 Malignant (primary) neoplasm, unspecified: Secondary | ICD-10-CM | POA: Diagnosis not present

## 2018-08-31 DIAGNOSIS — G8929 Other chronic pain: Secondary | ICD-10-CM | POA: Diagnosis present

## 2018-08-31 DIAGNOSIS — C641 Malignant neoplasm of right kidney, except renal pelvis: Secondary | ICD-10-CM

## 2018-08-31 DIAGNOSIS — M4802 Spinal stenosis, cervical region: Secondary | ICD-10-CM | POA: Diagnosis present

## 2018-08-31 DIAGNOSIS — S129XXA Fracture of neck, unspecified, initial encounter: Secondary | ICD-10-CM | POA: Diagnosis present

## 2018-08-31 DIAGNOSIS — M8448XA Pathological fracture, other site, initial encounter for fracture: Secondary | ICD-10-CM | POA: Diagnosis not present

## 2018-08-31 DIAGNOSIS — Z90710 Acquired absence of both cervix and uterus: Secondary | ICD-10-CM

## 2018-08-31 DIAGNOSIS — Z791 Long term (current) use of non-steroidal anti-inflammatories (NSAID): Secondary | ICD-10-CM

## 2018-08-31 DIAGNOSIS — D49519 Neoplasm of unspecified behavior of unspecified kidney: Secondary | ICD-10-CM | POA: Diagnosis not present

## 2018-08-31 DIAGNOSIS — E079 Disorder of thyroid, unspecified: Secondary | ICD-10-CM

## 2018-08-31 DIAGNOSIS — Z886 Allergy status to analgesic agent status: Secondary | ICD-10-CM | POA: Diagnosis not present

## 2018-08-31 DIAGNOSIS — Z79899 Other long term (current) drug therapy: Secondary | ICD-10-CM | POA: Diagnosis not present

## 2018-08-31 DIAGNOSIS — M5412 Radiculopathy, cervical region: Secondary | ICD-10-CM | POA: Diagnosis present

## 2018-08-31 DIAGNOSIS — D509 Iron deficiency anemia, unspecified: Secondary | ICD-10-CM | POA: Diagnosis present

## 2018-08-31 DIAGNOSIS — M4848XA Fatigue fracture of vertebra, sacral and sacrococcygeal region, initial encounter for fracture: Secondary | ICD-10-CM | POA: Diagnosis not present

## 2018-08-31 DIAGNOSIS — Z01811 Encounter for preprocedural respiratory examination: Secondary | ICD-10-CM

## 2018-08-31 DIAGNOSIS — C7802 Secondary malignant neoplasm of left lung: Secondary | ICD-10-CM | POA: Diagnosis present

## 2018-08-31 DIAGNOSIS — R918 Other nonspecific abnormal finding of lung field: Secondary | ICD-10-CM | POA: Diagnosis not present

## 2018-08-31 DIAGNOSIS — Z8249 Family history of ischemic heart disease and other diseases of the circulatory system: Secondary | ICD-10-CM

## 2018-08-31 DIAGNOSIS — Z888 Allergy status to other drugs, medicaments and biological substances status: Secondary | ICD-10-CM | POA: Diagnosis not present

## 2018-08-31 DIAGNOSIS — Z981 Arthrodesis status: Secondary | ICD-10-CM | POA: Diagnosis not present

## 2018-08-31 DIAGNOSIS — M19042 Primary osteoarthritis, left hand: Secondary | ICD-10-CM | POA: Diagnosis present

## 2018-08-31 DIAGNOSIS — M19041 Primary osteoarthritis, right hand: Secondary | ICD-10-CM | POA: Diagnosis present

## 2018-08-31 DIAGNOSIS — E041 Nontoxic single thyroid nodule: Secondary | ICD-10-CM | POA: Diagnosis present

## 2018-08-31 DIAGNOSIS — I10 Essential (primary) hypertension: Secondary | ICD-10-CM

## 2018-08-31 DIAGNOSIS — S299XXA Unspecified injury of thorax, initial encounter: Secondary | ICD-10-CM | POA: Diagnosis not present

## 2018-08-31 DIAGNOSIS — C7951 Secondary malignant neoplasm of bone: Secondary | ICD-10-CM | POA: Diagnosis not present

## 2018-08-31 DIAGNOSIS — M899 Disorder of bone, unspecified: Secondary | ICD-10-CM | POA: Diagnosis not present

## 2018-08-31 DIAGNOSIS — Z7982 Long term (current) use of aspirin: Secondary | ICD-10-CM | POA: Diagnosis not present

## 2018-08-31 DIAGNOSIS — Z96653 Presence of artificial knee joint, bilateral: Secondary | ICD-10-CM | POA: Diagnosis present

## 2018-08-31 DIAGNOSIS — N281 Cyst of kidney, acquired: Secondary | ICD-10-CM | POA: Diagnosis not present

## 2018-08-31 DIAGNOSIS — C7801 Secondary malignant neoplasm of right lung: Secondary | ICD-10-CM | POA: Diagnosis present

## 2018-08-31 DIAGNOSIS — N2889 Other specified disorders of kidney and ureter: Secondary | ICD-10-CM | POA: Diagnosis not present

## 2018-08-31 DIAGNOSIS — N179 Acute kidney failure, unspecified: Secondary | ICD-10-CM | POA: Diagnosis present

## 2018-08-31 DIAGNOSIS — R200 Anesthesia of skin: Secondary | ICD-10-CM | POA: Diagnosis present

## 2018-08-31 HISTORY — DX: Secondary malignant neoplasm of bone: C79.51

## 2018-08-31 LAB — BASIC METABOLIC PANEL
ANION GAP: 9 (ref 5–15)
BUN: 18 mg/dL (ref 8–23)
CALCIUM: 9.4 mg/dL (ref 8.9–10.3)
CO2: 23 mmol/L (ref 22–32)
CREATININE: 0.92 mg/dL (ref 0.44–1.00)
Chloride: 106 mmol/L (ref 98–111)
GFR calc Af Amer: >60 mL/min (ref >60)
GFR, EST NON AFRICAN AMERICAN: 58 mL/min — AB (ref >60)
GLUCOSE: 158 mg/dL — AB (ref 70–99)
Potassium: 4.1 mmol/L (ref 3.5–5.1)
Sodium: 138 mmol/L (ref 135–145)

## 2018-08-31 LAB — PROTIME-INR
INR: 1.12
Prothrombin Time: 14.3 seconds (ref 11.4–15.2)

## 2018-08-31 LAB — APTT: aPTT: 29 seconds (ref 24–36)

## 2018-08-31 MED ORDER — ACETAMINOPHEN 650 MG RE SUPP: 650.0000 mg | Freq: Four times a day (QID) | RECTAL | Status: DC | PRN

## 2018-08-31 MED ORDER — ACETAMINOPHEN 325 MG PO TABS
650.0000 mg | ORAL_TABLET | Freq: Four times a day (QID) | ORAL | Status: DC | PRN
  Administered 2018-08-31 – 2018-09-04 (×8): 650 mg via ORAL
  Filled 2018-08-31 (×8): qty 2

## 2018-08-31 MED ORDER — GABAPENTIN 100 MG PO CAPS: 100.0000 mg | ORAL_CAPSULE | ORAL | Status: DC

## 2018-08-31 MED ORDER — GABAPENTIN 100 MG PO CAPS
100.0000 mg | ORAL_CAPSULE | Freq: Every day | ORAL | Status: DC
  Administered 2018-09-02 – 2018-09-04 (×3): 100 mg via ORAL
  Filled 2018-08-31 (×4): qty 1

## 2018-08-31 MED ORDER — CEPHALEXIN 500 MG PO CAPS: 500.0000 mg | ORAL_CAPSULE | Freq: Four times a day (QID) | ORAL | Status: DC

## 2018-08-31 MED ORDER — SODIUM CHLORIDE 0.9 % IV SOLN
1.0000 g | Freq: Every day | INTRAVENOUS | Status: DC
  Administered 2018-08-31 – 2018-09-02 (×3): 1 g via INTRAVENOUS
  Filled 2018-08-31 (×3): qty 10

## 2018-08-31 MED ORDER — DEXAMETHASONE SODIUM PHOSPHATE 4 MG/ML IJ SOLN
10.0000 mg | Freq: Four times a day (QID) | INTRAMUSCULAR | Status: DC
  Administered 2018-08-31 – 2018-09-01 (×3): 10 mg via INTRAVENOUS
  Filled 2018-08-31 (×4): qty 3

## 2018-08-31 MED ORDER — ONDANSETRON HCL 4 MG/2ML IJ SOLN: 4.0000 mg | Freq: Four times a day (QID) | INTRAMUSCULAR | Status: DC | PRN

## 2018-08-31 MED ORDER — ONDANSETRON HCL 4 MG PO TABS: 4.0000 mg | ORAL_TABLET | Freq: Four times a day (QID) | ORAL | Status: DC | PRN

## 2018-08-31 MED ORDER — DEXAMETHASONE SODIUM PHOSPHATE 4 MG/ML IJ SOLN
4.0000 mg | Freq: Four times a day (QID) | INTRAMUSCULAR | Status: DC
  Administered 2018-08-31: 4 mg via INTRAVENOUS
  Filled 2018-08-31: qty 1

## 2018-08-31 MED ORDER — IOHEXOL 300 MG/ML  SOLN
100.0000 mL | Freq: Once | INTRAMUSCULAR | Status: AC | PRN
  Administered 2018-08-31: 100 mL via INTRAVENOUS

## 2018-08-31 MED ORDER — GABAPENTIN 100 MG PO CAPS
200.0000 mg | ORAL_CAPSULE | Freq: Two times a day (BID) | ORAL | Status: DC
  Administered 2018-08-31 – 2018-09-04 (×8): 200 mg via ORAL
  Filled 2018-08-31 (×8): qty 2

## 2018-08-31 MED ORDER — SODIUM CHLORIDE 0.9 % IV SOLN
INTRAVENOUS | Status: DC
  Administered 2018-08-31 – 2018-09-02 (×4): via INTRAVENOUS

## 2018-08-31 MED ORDER — DOCUSATE SODIUM 100 MG PO CAPS
100.0000 mg | ORAL_CAPSULE | Freq: Every day | ORAL | Status: DC | PRN
  Administered 2018-09-03: 100 mg via ORAL
  Filled 2018-08-31: qty 1

## 2018-08-31 MED ORDER — HEPARIN SODIUM (PORCINE) 5000 UNIT/ML IJ SOLN
5000.0000 [IU] | Freq: Three times a day (TID) | INTRAMUSCULAR | Status: DC
  Administered 2018-08-31 – 2018-09-01 (×4): 5000 [IU] via SUBCUTANEOUS
  Filled 2018-08-31 (×4): qty 1

## 2018-08-31 MED ORDER — PANTOPRAZOLE SODIUM 40 MG PO TBEC
40.0000 mg | DELAYED_RELEASE_TABLET | Freq: Every day | ORAL | Status: DC
  Administered 2018-08-31 – 2018-09-04 (×5): 40 mg via ORAL
  Filled 2018-08-31 (×5): qty 1

## 2018-08-31 NOTE — Consult Note (Signed)
Reason for Consult: Neck pain pathologic C7 fracture Referring Physician: Internal medicine  Eileen Norris is an 77 y.o. female.  HPI: 77 year old female who is had a month of neck pain interscapular pain and pain primarily into her shoulders.  She is also been experiencing numbness and tingling in her hands and her legs occasionally.  She has noted weakness in her arms as well.  Patient went to the emergency room at [facility] last night was worked up with an MRI scan of her neck which showed what appears to be a pathologic fracture of C7.  Patient has had remote surgery cervical spine in 2013 and her lumbar spine back in 2017.  Past Medical History:  Diagnosis Date  . Arthritis    Neck, knees, hands  . Dysrhythmia    "beating fast & flooding"  . GERD (gastroesophageal reflux disease)   . Hypertension     Past Surgical History:  Procedure Laterality Date  . ABDOMINAL HYSTERECTOMY    . ANTERIOR CERVICAL DECOMP/DISCECTOMY FUSION  12/02/2011   Procedure: ANTERIOR CERVICAL DECOMPRESSION/DISCECTOMY FUSION 1 LEVEL;  Surgeon: Hosie Spangle;  Location: [facility];  Service: Neurosurgery;  Laterality: N/A;  Cervical three - four Anterior Cervical Decompression Fusion  . JOINT REPLACEMENT     bilateral knees,     Family History  Problem Relation Age of Onset  . Hypertension Mother     Social History:  reports that she has never smoked. She has never used smokeless tobacco. She reports that she does not drink alcohol or use drugs.  Allergies:  Allergies  Allergen Reactions  . Advil [Ibuprofen] Other (See Comments)    "Gives me the shakes"  . Prilosec [Omeprazole] Other (See Comments)    Constipation    Medications: I have reviewed the patient's current medications.  Results for orders placed or performed during the hospital encounter of 08/31/18 (from the past 48 hour(s))  Basic metabolic panel     Status: Abnormal   Collection Time: 08/31/18  4:19 AM  Result Value Ref Range    Sodium 138 135 - 145 mmol/L   Potassium 4.1 3.5 - 5.1 mmol/L   Chloride 106 98 - 111 mmol/L   CO2 23 22 - 32 mmol/L   Glucose, Bld 158 (H) 70 - 99 mg/dL   BUN 18 8 - 23 mg/dL   Creatinine, Ser 0.92 0.44 - 1.00 mg/dL   Calcium 9.4 8.9 - 10.3 mg/dL   GFR calc non Af Amer 58 (L) >60 mL/min   GFR calc Af Amer >60 >60 mL/min    Comment: (NOTE) The eGFR has been calculated using the CKD EPI equation. This calculation has not been validated in all clinical situations. eGFR's persistently <60 mL/min signify possible Chronic Kidney Disease.    Anion gap 9 5 - 15    Comment: Performed at [facility] [address] [place], [place] [address]  Protime-INR     Status: None   Collection Time: 08/31/18  4:19 AM  Result Value Ref Range   Prothrombin Time 14.3 11.4 - 15.2 seconds   INR 1.12     Comment: Performed at [facility] [address], [place], [place] [address]  APTT     Status: None   Collection Time: 08/31/18  4:19 AM  Result Value Ref Range   aPTT 29 24 - 36 seconds    Comment: Performed at [facility] [address] [place], [place] [address]  Dg Cervical Spine 2-3 Views  Result Date: 08/30/2018 CLINICAL DATA:  Neck pain, prior fusion in 2013 EXAM: CERVICAL SPINE - 2-3 VIEW COMPARISON:  02/25/2012 FINDINGS: Prevertebral soft tissues normal thickness. Bones demineralized. Prior anterior fusion of C3-C4 with plate/screws and incorporated bone plugs. Superior endplate compression fracture of C7, new since 2013, with approximately 40% anterior height loss. Disc space narrowing with endplate spur formation at C5-C6 and C6-C7. Bulky anterior spurs at C2-C3 new since previous study. Scattered mild facet degenerative changes. No additional fracture or subluxation. IMPRESSION: Prior anterior fusion of C3-C4. New superior endplate compression fracture of C7 since 2013 with approximately 40% anterior height loss. Scattered degenerative disc and facet disease changes of  the cervical spine. Electronically Signed   By: Lavonia Dana M.D.   On: 08/30/2018 18:50   Dg Lumbar Spine 2-3 Views  Result Date: 08/30/2018 CLINICAL DATA:  Back pain, fell 2 weeks ago, lumbar surgery 2017 EXAM: LUMBAR SPINE - 2-3 VIEW COMPARISON:  02/27/2018 FINDINGS: 5 non-rib-bearing lumbar vertebra. Bones demineralized. BILATERAL pedicle screws and posterior bars L3-L5 with disc prosthesis at L4-L5. Hardware appears intact. Diffuse disc space narrowing. No fracture, subluxation or bone destruction. SI joints preserved. IMPRESSION: Postsurgical changes of posterior fusion L3-L5. Osseous demineralization with degenerative disc disease changes lumbar spine. No acute abnormalities. Electronically Signed   By: Lavonia Dana M.D.   On: 08/30/2018 18:46   Dg Shoulder Right  Result Date: 08/30/2018 CLINICAL DATA:  RIGHT shoulder pain EXAM: RIGHT SHOULDER - 2+ VIEW COMPARISON:  None FINDINGS: Osseous demineralization. Degenerative changes at [facility] joint with joint space narrowing and spur formation. No glenohumeral fracture or dislocation. Calcified bodies identified adjacent to proximal RIGHT humeral metadiaphysis. Visualized RIGHT ribs intact. IMPRESSION: Osseous demineralization with degenerative changes RIGHT AC joint. No acute abnormalities. Questionable calcified loose bodies adjacent to the proximal humerus. Electronically Signed   By: Lavonia Dana M.D.   On: 08/30/2018 18:43   Mr Cervical Spine Wo Contrast  Result Date: 08/30/2018 CLINICAL DATA:  C7 fracture. Four weeks of neck pain radiating into both arms with numbness. EXAM: MRI CERVICAL SPINE WITHOUT CONTRAST TECHNIQUE: Multiplanar, multisequence MR imaging of the cervical spine was performed. No intravenous contrast was administered. COMPARISON:  Cervical spine radiographs 08/30/2018 and MRI 11/29/2011 FINDINGS: Alignment: Straightening/slight reversal of the normal cervical lordosis. Trace retrolisthesis of C2 on C3. Vertebrae: Interval C3-4 ACDF  since the 2013 MRI with solid interbody arthrodesis. Pathologic C7 compression fracture with 60% height loss centrally. Complete replacement of normal marrow signal throughout the C7 vertebral body extending into the posterior elements bilaterally. 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 Tumor tumor also narrows the C7 and C8 neural foramina bilaterally. No suspicious marrow lesions identified elsewhere in the cervical and included upper thoracic spine. Cord: Normal signal. Posterior Fossa, vertebral arteries, paraspinal tissues: Preserved vertebral artery flow voids. Partially visualized 6 cm right thyroid mass, new or much larger than in 2013. Disc levels: C2-3: Minimal disc bulging and uncovertebral spurring without stenosis. C3-4: ACDF. Mild bilateral osseous neural foraminal narrowing. No spinal stenosis. C4-5: Disc bulging, uncovertebral spurring, and mild right and moderate left facet arthrosis have progressed from 2013 and result in moderate right and mild left neural foraminal stenosis and borderline spinal stenosis. C5-6: Broad-based posterior disc osteophyte complex results in borderline spinal stenosis and mild right and moderate to severe left neural foraminal stenosis, stable to slightly progressed from 2013. C6-7: Severe disc space narrowing. Moderate spinal stenosis due  to epidural tumor. Moderate bilateral neural foraminal stenosis. C7-T1: Moderate to severe bilateral neural foraminal stenosis due to tumor. No significant spinal stenosis at the disc space level. IMPRESSION: 1. Pathologic C7 compression fracture with 60% height loss and epidural tumor resulting in moderate spinal stenosis and moderate to severe bilateral C7 and C8 neural foraminal stenosis. This could reflect multiple myeloma or metastatic disease. 2. Solid C3-4 ACDF without significant residual stenosis. 3. Progressive disc degeneration at C4-5 and C5-6 since 2013 with moderate  to severe left neural foraminal stenosis at C5-6. Electronically Signed   By: Logan Bores M.D.   On: 08/30/2018 21:56    Review of Systems  Musculoskeletal: Positive for joint pain and neck pain.   Blood pressure (!) 148/72, pulse 76, temperature 98.1 F (36.7 C), temperature source Oral, resp. rate 18, height _0  (1.575 m), weight 84.6 kg, SpO2 100 %. Physical Exam  Constitutional: She is oriented to person, place, and time.  Neurological: She is alert and oriented to person, place, and time. GCS eye subscore is 4. GCS verbal subscore is 5. GCS motor subscore is 6.  Patient is awake and alert neck has limited range of motion secondary to pain strength is 4+ out of 5 deltoid, biceps, triceps hand intrinsics appear to be 4 out of 5 bilaterally lower extremity strength appears to be 5 out of 5 reflexes are decreased patellar and ankle bilaterally no clonus    Assessment/Plan: 77 year old female with pathologic burst fracture of C7 moderate central stenosis and by foraminal stenosis.  Patient has no known history of carcinoma.  MRI scan also shows what appears to be a large right thyroid mass.  Imaging was read out as radiology is possibly consistent with multiple myeloma I think also consideration should be given to thyroid metastasis.  She is getting full work-up with CT scans chest abdomen pelvis we probably should add a soft tissue CT of her neck and she is also awaiting blood work for SPEP and UPEP and 24-hour protein.  Recommend general surgery consult for the thyroid mass.  I discussed this with Dr. Sherwood Gambler we will continue to follow and patient will more than likely require surgical decompression stabilization procedure unless this turns out to be a highly radiosensitive or chemotherapy sensitive mass.  Amonte Brookover P 08/31/2018, 6:58 AM

## 2018-08-31 NOTE — ED Notes (Signed)
EMTALA form reviewed. 

## 2018-08-31 NOTE — ED Provider Notes (Signed)
Eileen Norris had asked for a [place] collar for the patient.  We have not been able to find one in this hospital.  Hopefully [facility] ambulance will have one when they get here if not we will at least put the patient in a [place] collar the only thing that we have available.   Nena Polio, MD 08/31/18 [place]

## 2018-08-31 NOTE — ED Notes (Signed)
Pt unable to sign ED transfer consent due to numbness in bilateral upper extremities.

## 2018-08-31 NOTE — H&P (Signed)
History and Physical    [name] VZD:[id number] DOB: 07-Apr-1941 DOA: 08/31/2018  PCP: [name], MD  Patient coming from: Home  I have personally briefly reviewed patient's old medical records in [place]  Chief Complaint: Back pain  HPI: Eileen Norris is a 77 y.o. female with medical history significant of HTN.  Patient presents to the ED at [facility] with c/o neck, shoulder, and back pain.  Also has BLE numbness.  Symptoms onset 4 weeks ago, worsening.  Not helped by gabapentin she was prescribed.  She has H/O C spine ans well as L spine surgeries in the past.  No known trauma nor injuries prior to this   ED Course: Found to have pathologic fx at C7 with moderate spinal stenosis, mod to severe B foraminal stenosis.    Review of Systems: As per HPI otherwise 10 point review of systems negative.   Past Medical History:  Diagnosis Date  . Arthritis    Neck, knees, hands  . Dysrhythmia    "beating fast & flooding"  . GERD (gastroesophageal reflux disease)   . Hypertension     Past Surgical History:  Procedure Laterality Date  . ABDOMINAL HYSTERECTOMY    . ANTERIOR CERVICAL DECOMP/DISCECTOMY FUSION  12/02/2011   Procedure: ANTERIOR CERVICAL DECOMPRESSION/DISCECTOMY FUSION 1 LEVEL;  Surgeon: Hosie Spangle;  Location: [facility];  Service: Neurosurgery;  Laterality: N/A;  Cervical three - four Anterior Cervical Decompression Fusion  . JOINT REPLACEMENT     bilateral knees,      reports that she has never smoked. She has never used smokeless tobacco. She reports that she does not drink alcohol or use drugs.  Allergies  Allergen Reactions  . Advil [Ibuprofen] Other (See Comments)    "Gives me the shakes"  . Prilosec [Omeprazole] Other (See Comments)    Constipation    Family History  Problem Relation Age of Onset  . Hypertension Mother      Prior to Admission medications   Medication Sig Start Date End Date Taking? Authorizing Provider  aspirin EC  81 MG tablet Take 81 mg by mouth daily.    [provider]  Cholecalciferol (VITAMIN D-3) 1000 units CAPS Take 1,000 Units by mouth daily.    [provider]  ferrous sulfate 325 (65 FE) MG EC tablet Take 325 mg by mouth daily with breakfast.    [provider]  furosemide (LASIX) 20 MG tablet Take 20 mg by mouth 2 (two) times a week.  01/04/16   [provider]  hydrochlorothiazide (HYDRODIURIL) 50 MG tablet Take 50 mg by mouth daily.  01/29/16   [provider]  lisinopril (PRINIVIL,ZESTRIL) 20 MG tablet Take 20 mg by mouth daily.    [provider]  meloxicam (MOBIC) 7.5 MG tablet Take 7.5 mg by mouth 2 times daily at 12 noon and 4 pm.    [provider]  metoprolol (LOPRESSOR) 50 MG tablet Take 50 mg by mouth 2 (two) times daily.      [provider]  Multiple Vitamin (MULTIVITAMIN) tablet Take 1 tablet by mouth daily.    [provider]  rosuvastatin (CRESTOR) 10 MG tablet  01/28/17   [provider]    Physical Exam: Vitals:   08/31/18 0247  BP: (!) 148/72  Pulse: 76  Resp: 18  Temp: 98.1 F (36.7 C)  TempSrc: Oral  SpO2: 100%  Weight: 84.6 kg  Height: '5\' 2"'  (1.575 m)    Constitutional:  NAD, calm, comfortable Eyes: PERRL, lids and conjunctivae normal ENMT: Mucous membranes are moist. Posterior pharynx clear of any exudate or lesions.Normal dentition.  Neck: normal, supple, no masses, no thyromegaly Respiratory: clear to auscultation bilaterally, no wheezing, no crackles. Normal respiratory effort. No accessory muscle use.  Cardiovascular: Regular rate and rhythm, no murmurs / rubs / gallops. No extremity edema. 2+ pedal pulses. No carotid bruits.  Abdomen: no tenderness, no masses palpated. No hepatosplenomegaly. Bowel sounds positive.  Musculoskeletal: no clubbing / cyanosis. No joint deformity upper and lower extremities. Good ROM, no contractures. Normal muscle tone.  Skin: no rashes,  lesions, ulcers. No induration Neurologic: In aspen collar, She has full grip strength, biceps and triceps strength bilaterally.  Sensation is intact distally.  Good strength / ROM B hips, knees, ankles Psychiatric: Normal judgment and insight. Alert and oriented x 3. Normal mood.    Labs on Admission: I have personally reviewed following labs and imaging studies  CBC: Recent Labs  Lab 08/30/18 2047  WBC 6.2  HGB 11.3*  HCT 33.2*  MCV 90.3  PLT 681   Basic Metabolic Panel: Recent Labs  Lab 08/30/18 2047  NA 139  K 4.6  CL 104  CO2 25  GLUCOSE 121*  BUN 23  CREATININE 1.07*  CALCIUM 9.8   GFR: Estimated Creatinine Clearance: 43 mL/min (A) (by C-G formula based on SCr of 1.07 mg/dL (H)). Liver Function Tests: Recent Labs  Lab 08/30/18 2047  AST 18  ALT 10  ALKPHOS 101  BILITOT 0.4  PROT 7.9  ALBUMIN 4.0   No results for input(s): LIPASE, AMYLASE in the last 168 hours. No results for input(s): AMMONIA in the last 168 hours. Coagulation Profile: No results for input(s): INR, PROTIME in the last 168 hours. Cardiac Enzymes: No results for input(s): CKTOTAL, CKMB, CKMBINDEX, TROPONINI in the last 168 hours. BNP (last 3 results) No results for input(s): PROBNP in the last 8760 hours. HbA1C: No results for input(s): HGBA1C in the last 72 hours. CBG: No results for input(s): GLUCAP in the last 168 hours. Lipid Profile: No results for input(s): CHOL, HDL, LDLCALC, TRIG, CHOLHDL, LDLDIRECT in the last 72 hours. Thyroid Function Tests: No results for input(s): TSH, T4TOTAL, FREET4, T3FREE, THYROIDAB in the last 72 hours. Anemia Panel: No results for input(s): VITAMINB12, FOLATE, FERRITIN, TIBC, IRON, RETICCTPCT in the last 72 hours. Urine analysis:    Component Value Date/Time   COLORURINE YELLOW (A) 08/30/2018 2047   APPEARANCEUR HAZY (A) 08/30/2018 2047   APPEARANCEUR Cloudy 11/28/2011 2236   LABSPEC 1.015 08/30/2018 2047   LABSPEC 1.010 11/28/2011 2236    PHURINE 5.0 08/30/2018 2047   GLUCOSEU NEGATIVE 08/30/2018 2047   GLUCOSEU Negative 11/28/2011 2236   HGBUR NEGATIVE 08/30/2018 2047   BILIRUBINUR NEGATIVE 08/30/2018 2047   BILIRUBINUR Negative 11/28/2011 2236   [place] NEGATIVE 08/30/2018 2047   PROTEINUR 30 (A) 08/30/2018 2047   NITRITE NEGATIVE 08/30/2018 2047   LEUKOCYTESUR MODERATE (A) 08/30/2018 2047   LEUKOCYTESUR Negative 11/28/2011 2236    Radiological Exams on Admission: Dg Cervical Spine 2-3 Views  Result Date: 08/30/2018 CLINICAL DATA:  Neck pain, prior fusion in 2013 EXAM: CERVICAL SPINE - 2-3 VIEW COMPARISON:  02/25/2012 FINDINGS: Prevertebral soft tissues normal thickness. Bones demineralized. Prior anterior fusion of C3-C4 with plate/screws and incorporated bone plugs. Superior endplate compression fracture of C7, new since 2013, with approximately 40% anterior height loss. Disc space narrowing with endplate spur formation at C5-C6 and C6-C7. Bulky anterior spurs at C2-C3 new  since previous study. Scattered mild facet degenerative changes. No additional fracture or subluxation. IMPRESSION: Prior anterior fusion of C3-C4. New superior endplate compression fracture of C7 since 2013 with approximately 40% anterior height loss. Scattered degenerative disc and facet disease changes of the cervical spine. Electronically Signed   By: Lavonia Dana M.D.   On: 08/30/2018 18:50   Dg Lumbar Spine 2-3 Views  Result Date: 08/30/2018 CLINICAL DATA:  Back pain, fell 2 weeks ago, lumbar surgery 2017 EXAM: LUMBAR SPINE - 2-3 VIEW COMPARISON:  02/27/2018 FINDINGS: 5 non-rib-bearing lumbar vertebra. Bones demineralized. BILATERAL pedicle screws and posterior bars L3-L5 with disc prosthesis at L4-L5. Hardware appears intact. Diffuse disc space narrowing. No fracture, subluxation or bone destruction. SI joints preserved. IMPRESSION: Postsurgical changes of posterior fusion L3-L5. Osseous demineralization with degenerative disc disease changes lumbar  spine. No acute abnormalities. Electronically Signed   By: Lavonia Dana M.D.   On: 08/30/2018 18:46   Dg Shoulder Right  Result Date: 08/30/2018 CLINICAL DATA:  RIGHT shoulder pain EXAM: RIGHT SHOULDER - 2+ VIEW COMPARISON:  None FINDINGS: Osseous demineralization. Degenerative changes at [facility] joint with joint space narrowing and spur formation. No glenohumeral fracture or dislocation. Calcified bodies identified adjacent to proximal RIGHT humeral metadiaphysis. Visualized RIGHT ribs intact. IMPRESSION: Osseous demineralization with degenerative changes RIGHT AC joint. No acute abnormalities. Questionable calcified loose bodies adjacent to the proximal humerus. Electronically Signed   By: Lavonia Dana M.D.   On: 08/30/2018 18:43   Mr Cervical Spine Wo Contrast  Result Date: 08/30/2018 CLINICAL DATA:  C7 fracture. Four weeks of neck pain radiating into both arms with numbness. EXAM: MRI CERVICAL SPINE WITHOUT CONTRAST TECHNIQUE: Multiplanar, multisequence MR imaging of the cervical spine was performed. No intravenous contrast was administered. COMPARISON:  Cervical spine radiographs 08/30/2018 and MRI 11/29/2011 FINDINGS: Alignment: Straightening/slight reversal of the normal cervical lordosis. Trace retrolisthesis of C2 on C3. Vertebrae: Interval C3-4 ACDF since the 2013 MRI with solid interbody arthrodesis. Pathologic C7 compression fracture with 60% height loss centrally. Complete replacement of normal marrow signal throughout the C7 vertebral body extending into the posterior elements bilaterally. 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 Tumor tumor also narrows the C7 and C8 neural foramina bilaterally. No suspicious marrow lesions identified elsewhere in the cervical and included upper thoracic spine. Cord: Normal signal. Posterior Fossa, vertebral arteries, paraspinal tissues: Preserved vertebral artery flow voids. Partially visualized 6 cm right thyroid  mass, new or much larger than in 2013. Disc levels: C2-3: Minimal disc bulging and uncovertebral spurring without stenosis. C3-4: ACDF. Mild bilateral osseous neural foraminal narrowing. No spinal stenosis. C4-5: Disc bulging, uncovertebral spurring, and mild right and moderate left facet arthrosis have progressed from 2013 and result in moderate right and mild left neural foraminal stenosis and borderline spinal stenosis. C5-6: Broad-based posterior disc osteophyte complex results in borderline spinal stenosis and mild right and moderate to severe left neural foraminal stenosis, stable to slightly progressed from 2013. C6-7: Severe disc space narrowing. Moderate spinal stenosis due to epidural tumor. Moderate bilateral neural foraminal stenosis. C7-T1: Moderate to severe bilateral neural foraminal stenosis due to tumor. No significant spinal stenosis at the disc space level. IMPRESSION: 1. Pathologic C7 compression fracture with 60% height loss and epidural tumor resulting in moderate spinal stenosis and moderate to severe bilateral C7 and C8 neural foraminal stenosis. This could reflect multiple myeloma or metastatic disease. 2. Solid C3-4 ACDF without significant residual stenosis. 3. Progressive disc degeneration at  C4-5 and C5-6 since 2013 with moderate to severe left neural foraminal stenosis at C5-6. Electronically Signed   By: Logan Bores M.D.   On: 08/30/2018 21:56    EKG: Independently reviewed.  Assessment/Plan Principal Problem:   Pathologic fracture of cervical vertebra, initial encounter Active Problems:   HTN (hypertension)    1. Pathologic fx of cervical vertebra - 1. Calling Dr. Saintclair Halsted to let him know patient has arrived at [facility] 2. Leaving patient on decadron 66m Q6H for the moment until NS decides otherwise 3. NPO for possible surgery 4. IVF - NS at 100 cc/hr 5. Will check EKG, PT-INR, APTT 6. Dr. CSaintclair Halstedwants work up for other primary source right now: will get CT chest, abd,  pelvis 7. SPEP, UPEP 2. HTN - 1. Hold diuretics 2. Continue other home BP meds once pharm has done med-rec  DVT prophylaxis: SCDs Code Status: Full Family Communication: Family at bedside Disposition Plan: TBD Consults called: Dr. CSaintclair Halsted NS Admission status: Admit to inpatient  Severity of Illness: The appropriate patient status for this patient is INPATIENT. Inpatient status is judged to be reasonable and necessary in order to provide the required intensity of service to ensure the patient's safety. The patient's presenting symptoms, physical exam findings, and initial radiographic and laboratory data in the context of their chronic comorbidities is felt to place them at high risk for further clinical deterioration. Furthermore, it is not anticipated that the patient will be medically stable for discharge from the hospital within 2 midnights of admission. The following factors support the patient status of inpatient.   " The patient's presenting symptoms include Leg numbness, neck shoulder back pain. " The worrisome physical exam findings include impaired sensation. " The initial radiographic and laboratory data are worrisome because of C7 pathologic fx with cord stenosis, B foraminal stenosis. " The chronic co-morbidities include HTN.   * I certify that at the point of admission it is my clinical judgment that the patient will require inpatient hospital care spanning beyond 2 midnights from the point of admission due to high intensity of service, high risk for further deterioration and high frequency of surveillance required.*Etta QuillDO [facility] Pager [phone] works nights!  If 7AM-7PM, please contact the primary day team physician taking care of patient  [email or web address] Password [email or web address]  08/31/2018, 3:44 AM

## 2018-08-31 NOTE — ED Notes (Signed)
[employer] called, Eileen Norris, Product/process development scientist, to call Eileen Norris, Therapist, sports for equipment possibility

## 2018-08-31 NOTE — Progress Notes (Addendum)
[place] TEAM 1 - Stepdown/ICU TEAM  Eileen Norris  EUM:[id number] DOB: [birth year] DOA: 08/31/2018 PCP: [name], MD    Brief Narrative:  77 y.o. female with a hx of HTN who presented to the ED at [facility] with neck, shoulder, and back pain as well as B LE numbness for 4 weeks.   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   Subjective: Pt is seen for a f/u visit.    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 Imaging has revealed a 6.5cm R kidney solid and cystic mass, multiple B pulmonary nodules, lytic destructive lesion R medial iliac crest as well as smaller L sided iliac crest lestion, and a L iliac bone lesion - when I approached the pt today to discuss these findings she informed me that she wished to wait until her son was present to go over the results of the scans/the tx plan - I have therefore NOT INFORMED HER YET of this suspected diagnosis   Pathologic fx C7 w/ stenosis  NS following - may require intervention depending upon extent to which her primary CA is felt to be amenable to tx   5.4cm encapsulated R thyroid nodule Not likely related to the primary issue  UTI Cont empiric abx   DVT prophylaxis: SQ heparin  Code Status: FULL CODE Family Communication:  Disposition Plan: SDU   Consultants:  Neurosurgery   Antimicrobials:  Rocephin 10/6 >  Objective: Blood pressure 128/61, pulse (!) 58, temperature 98.2 F (36.8 C), temperature source Oral, resp. rate 12, height 5\' 2"  (1.575 m), weight 84.6 kg, SpO2 99 %.  Intake/Output Summary (Last 24 hours) at 08/31/2018 1443 Last data filed at 08/31/2018 1350 Gross per 24 hour  Intake 768.06 ml  Output 675 ml  Net 93.06 ml   Filed Weights   08/31/18 0247  Weight: 84.6 kg    Examination: Pt was seen for a f/u visit.    CBC: Recent Labs  Lab 08/30/18 2047  WBC 6.2  HGB 11.3*  HCT 33.2*  MCV  90.3  PLT 540   Basic Metabolic Panel: Recent Labs  Lab 08/30/18 2047 08/31/18 0419  NA 139 138  K 4.6 4.1  CL 104 106  CO2 25 23  GLUCOSE 121* 158*  BUN 23 18  CREATININE 1.07* 0.92  CALCIUM 9.8 9.4   GFR: Estimated Creatinine Clearance: 50 mL/min (by C-G formula based on SCr of 0.92 mg/dL).  Liver Function Tests: Recent Labs  Lab 08/30/18 2047  AST 18  ALT 10  ALKPHOS 101  BILITOT 0.4  PROT 7.9  ALBUMIN 4.0    Coagulation Profile: Recent Labs  Lab 08/31/18 0419  INR 1.12    HbA1C: Hemoglobin A1C  Date/Time Value Ref Range Status  11/30/2011 12:00 AM 6.4 (H) 4.2 - 6.3 % Final    Comment:    The American Diabetes Association recommends that a primary goal of therapy should be <7% and that physicians should reevaluate the treatment regimen in patients with HbA1c values consistently >8%.     Scheduled Meds: . dexamethasone  10 mg Intravenous Q6H   Continuous Infusions: . sodium chloride 100 mL/hr at 08/31/18 1141  . cefTRIAXone (ROCEPHIN)  IV 1 g (08/31/18 0434)     LOS: 0 days   Time spent: No Charge  Cherene Altes, MD [facility] Office  [phone] Pager -  Text Page per Shea Evans as per below:  On-Call/Text Page:      [email or web address]  If 7PM-7AM, please contact night-coverage [email or web address] 08/31/2018, 2:43 PM

## 2018-08-31 NOTE — Plan of Care (Signed)
Brief accept note  Dr. 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  Cord flattening noted on MRI at C7.  Discussed the case with neurosurgery team, Dr. Saintclair Halsted who asked for the patient to be admitted to [facility] for evaluation for possible spine surgery.  I relayed to [facility] that the patient appeared to be appropriate for the floor.  Tentatively would plan on continuing dexamethasone (4 mg q 6 hrs until cord compression definitively ruled out), MRI of the remaining spine segments, consultation of neurosurgery, searching for possible primary malignancy site (possibly CT of chest, abdomen, and pelvis if no primary site evident based on initial H&P).    Eileen Norris    Cheri Rous, MD [facility] Page:[phone]  If 7PM-7AM, please contact night-coverage [email or web address] Password [email or web address]

## 2018-08-31 NOTE — ED Provider Notes (Signed)
MRI returned showing pathologic fracture with tumor replacement of C7 and some cord compression discussed patient with Dr.Cram neurosurgery and Dr. Stana Bunting hospitalist.  Patient will go to [facility] Dr. Stana Bunting would be the accepting physician.  Patient is already gotten steroids we will try to get a hard collar.  She will be transported by balance   Nena Polio, MD 08/31/18 [phone]

## 2018-08-31 NOTE — ED Notes (Signed)
[employer] returned call about C-collar and are coming up to fit patient.

## 2018-09-01 ENCOUNTER — Inpatient Hospital Stay (HOSPITAL_COMMUNITY): Payer: Medicare Other

## 2018-09-01 ENCOUNTER — Other Ambulatory Visit: Payer: Self-pay | Admitting: Radiation Therapy

## 2018-09-01 ENCOUNTER — Encounter (HOSPITAL_COMMUNITY): Payer: Self-pay | Admitting: Urology

## 2018-09-01 ENCOUNTER — Ambulatory Visit
Admission: RE | Admit: 2018-09-01 | Discharge: 2018-09-01 | Disposition: A | Discharge status: Home / Self Care | Payer: Medicare Other | Source: Ambulatory Visit | Attending: Radiation Oncology | Admitting: Radiation Oncology

## 2018-09-01 DIAGNOSIS — R918 Other nonspecific abnormal finding of lung field: Secondary | ICD-10-CM

## 2018-09-01 DIAGNOSIS — D49519 Neoplasm of unspecified behavior of unspecified kidney: Secondary | ICD-10-CM

## 2018-09-01 DIAGNOSIS — N2889 Other specified disorders of kidney and ureter: Secondary | ICD-10-CM

## 2018-09-01 DIAGNOSIS — C7951 Secondary malignant neoplasm of bone: Secondary | ICD-10-CM

## 2018-09-01 DIAGNOSIS — M8448XA Pathological fracture, other site, initial encounter for fracture: Principal | ICD-10-CM

## 2018-09-01 DIAGNOSIS — E079 Disorder of thyroid, unspecified: Secondary | ICD-10-CM

## 2018-09-01 HISTORY — DX: Secondary malignant neoplasm of bone: C79.51

## 2018-09-01 LAB — URINE CULTURE

## 2018-09-01 MED ORDER — DEXAMETHASONE SODIUM PHOSPHATE 10 MG/ML IJ SOLN
10.0000 mg | Freq: Four times a day (QID) | INTRAMUSCULAR | Status: DC
  Administered 2018-09-01 – 2018-09-03 (×9): 10 mg via INTRAVENOUS
  Filled 2018-09-01 (×9): qty 1

## 2018-09-01 NOTE — Progress Notes (Signed)
Chaplain rec'd spiritual care consult.  Pt came in for neck/shoulder pain and rec'd unexpected news she had cancer. Pt's oldest son, Lanelle (sp?) was bedside when chaplain arrived.  When chaplain mentioned word cancer, pt' shook head and said "tumor".  Chaplain was aware that pt had received news of diagnosis with A son present, but this was not that son. Pt said she did not want her oldest to know she had cancer. Chaplain prayed with family. "It's in God's hands," she said.   Chaplain will continue to visit while pt here.  Tamsen Snider [phone] Pager.

## 2018-09-01 NOTE — Consult Note (Signed)
Reason for the request:   Kidney tumor  HPI: I was asked by Dr. Horris Latino to evaluate [name] for a kidney mass.  She is a 77 year old woman currently a [place] where she lived majority of her life.  She has history of hypertension and arthritis with no other significant comorbid conditions.  She has been dealing with chronic neck and back pain intermittently for the last year or so.  She was seen at [facility] on 08/30/2018 and imaging studies at the time showed a C7 pathological fracture and spinal stenosis related to it.  He is transferred to [facility] for further evaluation.  MRI of the cervical spine obtained on 08/30/2018 showed a pathological C7 compression fracture with 60% height loss and epidural tumor resulting in moderate spinal stenosis and severe bilateral C7 and C8 neutral foramina.  Imaging studies of the chest abdomen and pelvis obtained on 08/31/2018 showed 6.5 x 5.3 cm mass arising from the lower pole of the right kidney consistent with renal neoplasm.  Bony metastasis was noted with lytic destructive lesions in the right medial iliac crest and the left iliac bone and mixed lytic and sclerotic lesion in the left iliac bone.  She also had a right thyroid nodule measuring 4.6 cm.  Multiple pulmonary nodules were also noted at the time.  She has been evaluated by neurosurgery and she is wearing neck brace at this time.  Clinically, she reports chronic back and neck pain which has improved since his been in the hospital.  She denied any neurological deficits.  Her upper extremity motor function.  She denies any hematuria or flank pain.  She denies any abdominal distention or early satiety.  She denies any constitutional symptoms of weight loss.   She does not report any headaches, blurry vision, syncope or seizures. Does not report any fevers, chills or sweats.  Does not report any cough, wheezing or hemoptysis.  Does not report any chest pain, palpitation, orthopnea or leg edema.  Does  not report any nausea, vomiting or abdominal pain.  Does not report any constipation or diarrhea.  Does not report any skeletal complaints.    Does not report frequency, urgency or hematuria.  Does not report any skin rashes or lesions. Does not report any heat or cold intolerance.  Does not report any lymphadenopathy or petechiae.  Does not report any anxiety or depression.  Remaining review of systems is negative.    Past Medical History:  Diagnosis Date  . Arthritis    Neck, knees, hands  . Bone metastases ([place]) 09/01/2018  . Dysrhythmia    "beating fast & flooding"  . GERD (gastroesophageal reflux disease)   . Hypertension   :  Past Surgical History:  Procedure Laterality Date  . ABDOMINAL HYSTERECTOMY    . ANTERIOR CERVICAL DECOMP/DISCECTOMY FUSION  12/02/2011   Procedure: ANTERIOR CERVICAL DECOMPRESSION/DISCECTOMY FUSION 1 LEVEL;  Surgeon: Hosie Spangle;  Location: [place] [facility] OR[facility];  Service: Neurosurgery;  Laterality: N/A;  Cervical three - four Anterior Cervical Decompression Fusion  . JOINT REPLACEMENT     bilateral knees,   :   Current Facility-Administered Medications:  .  0.9 %  sodium chloride infusion, , Intravenous, Continuous, Cherene Altes, MD, Last Rate: 50 mL/hr at 09/01/18 0102 .  acetaminophen (TYLENOL) tablet 650 mg, 650 mg, Oral, Q6H PRN, 650 mg at 09/01/18 1139 **OR** [DISCONTINUED] acetaminophen (TYLENOL) suppository 650 mg, 650 mg, Rectal, Q6H PRN, Etta Quill, DO .  cefTRIAXone (ROCEPHIN) 1 g in  sodium chloride 0.9 % 100 mL IVPB, 1 g, Intravenous, Q0600, Etta Quill, DO, Last Rate: 200 mL/hr at 09/01/18 0610, 1 g at 09/01/18 0610 .  dexamethasone (DECADRON) injection 10 mg, 10 mg, Intravenous, Q6H, Cherene Altes, MD, 10 mg at 09/01/18 1133 .  docusate sodium (COLACE) capsule 100 mg, 100 mg, Oral, Daily PRN, Cherene Altes, MD .  gabapentin (NEURONTIN) capsule 100 mg, 100 mg, Oral, Q1200, Anabel Bene, Megan L, RPH .  gabapentin (NEURONTIN)  capsule 200 mg, 200 mg, Oral, BID, Vertis Kelch L, RPH, 200 mg at 09/01/18 1133 .  heparin injection 5,000 Units, 5,000 Units, Subcutaneous, Q8H, Cherene Altes, MD, 5,000 Units at 09/01/18 1340 .  ondansetron (ZOFRAN) tablet 4 mg, 4 mg, Oral, Q6H PRN **OR** ondansetron (ZOFRAN) injection 4 mg, 4 mg, Intravenous, Q6H PRN, Alcario Drought, Jared M, DO .  pantoprazole (PROTONIX) EC tablet 40 mg, 40 mg, Oral, Q1200, Cherene Altes, MD, 40 mg at 09/01/18 1134:  Allergies  Allergen Reactions  . Advil [Ibuprofen] Other (See Comments)    "Gives me the shakes"  . Prilosec [Omeprazole] Other (See Comments)    Constipation  :  Family History  Problem Relation Age of Onset  . Hypertension Mother   :  Social History   Socioeconomic History  . Marital status: Single    Spouse name: Not on file  . Number of children: Not on file  . Years of education: Not on file  . Highest education level: Not on file  Occupational History  . Not on file  Social Needs  . Financial resource strain: Not on file  . Food insecurity:    Worry: Not on file    Inability: Not on file  . Transportation needs:    Medical: Not on file    Non-medical: Not on file  Tobacco Use  . Smoking status: Never Smoker  . Smokeless tobacco: Never Used  Substance and Sexual Activity  . Alcohol use: No  . Drug use: No  . Sexual activity: Never  Lifestyle  . Physical activity:    Days per week: Not on file    Minutes per session: Not on file  . Stress: Not on file  Relationships  . Social connections:    Talks on phone: Not on file    Gets together: Not on file    Attends religious service: Not on file    Active member of club or organization: Not on file    Attends meetings of clubs or organizations: Not on file    Relationship status: Not on file  . Intimate partner violence:    Fear of current or ex partner: Not on file    Emotionally abused: Not on file    Physically abused: Not on file    Forced sexual  activity: Not on file  Other Topics Concern  . Not on file  Social History Narrative  . Not on file  :  Pertinent items are noted in HPI.  Exam: Blood pressure (!) 155/67, pulse 69, temperature 98.4 F (36.9 C), temperature source Oral, resp. rate 16, height '5\' 2"'  (1.575 m), weight 186 lb 8.1 oz (84.6 kg), SpO2 98 %.  ECOG 1 General appearance: alert and cooperative appeared without distress. Head: atraumatic without any abnormalities. Eyes: conjunctivae/corneas clear. PERRL.  Sclera anicteric. Throat: lips, mucosa, and tongue normal; without oral thrush or ulcers. Resp: clear to auscultation bilaterally without rhonchi, wheezes or dullness to percussion. Cardio: regular rate and rhythm, S1, S2  normal, no murmur, click, rub or gallop GI: soft, non-tender; bowel sounds normal; no masses,  no organomegaly Skin: Skin color, texture, turgor normal. No rashes or lesions Lymph nodes: Cervical, supraclavicular, and axillary nodes normal. Neurologic: Grossly normal without any motor, sensory or deep tendon reflexes. Musculoskeletal: No joint deformity or effusion.  Recent Labs    08/30/18 2047  WBC 6.2  HGB 11.3*  HCT 33.2*  PLT 256   Recent Labs    08/30/18 2047 08/31/18 0419  NA 139 138  K 4.6 4.1  CL 104 106  CO2 25 23  GLUCOSE 121* 158*  BUN 23 18  CREATININE 1.07* 0.92  CALCIUM 9.8 9.4     `    Ct Soft Tissue Neck W Contrast  Result Date: 08/31/2018 CLINICAL DATA:  Mediastinal mass, substernal thyroid suspected. Pathologic fracture of the cervical spine. EXAM: CT NECK WITH CONTRAST TECHNIQUE: Multidetector CT imaging of the neck was performed using the standard protocol following the bolus administration of intravenous contrast. CONTRAST:  151m OMNIPAQUE IOHEXOL 300 MG/ML  SOLN COMPARISON:  MRI of the cervical spine 08/30/2018. CT of the cervical spine 11/28/2011. FINDINGS: Pharynx and larynx: Asymmetric right-sided soft tissue is present at the palatine tonsils.  Calcifications are present. This likely represents chronic inflammation. No definite mass lesion is present. No focal mucosal or submucosal lesion is present. Vocal cords are midline and symmetric. Salivary glands: The submandibular and parotid glands are normal. Thyroid: Asymmetric heterogeneous nodule is present within the right lobe of the thyroid. The nodule measures 3.8 x 5.4 x 3.9 cm. The right-sided nodule has increased significantly since 2013. There smaller nodules in the left lobe of the thyroid. Lymph nodes: No significant cervical adenopathy is present. Vascular: Atherosclerotic changes are present at the aortic arch. No significant vascular lesions are present in the neck. Limited intracranial: Within normal limits. Visualized orbits: Visualized globes and orbits are within normal limits. Mastoids and visualized paranasal sinuses: The paranasal sinuses and mastoid air cells are clear. Skeleton: The pathologic fracture at C7 is again noted. No other focal lytic lesions are present. Anterior fusion is present at C3-4. Upper chest: Multiple bilateral lung nodules are present. The largest right upper lobe nodule measures 7.5 mm. Two 7 mm left upper lobe nodules are present. IMPRESSION: 1. Lytic pathologic compression fracture at T7 with associated central canal stenosis. 2. Multiple bilateral pulmonary nodules consistent with metastatic disease. 3. No other osseous lesions. 4. Marked increase in size of heterogeneous right thyroid nodule measuring 3.8 x 5.4 x 3.9 cm. While this may represent malignancy, it remains in capsulated. The lesion is amenable to ultrasound-guided biopsy. Other sources for primary tumor should be evaluated as well. CT of the chest, abdomen, and pelvis is being performed concurrently. Electronically Signed   By: CSan MorelleM.D.   On: 08/31/2018 11:16   Ct Chest W Contrast  Result Date: 08/31/2018 CLINICAL DATA:  Pathologic cervical spine fracture. Assessing for primary  malignancy EXAM: CT CHEST, ABDOMEN, AND PELVIS WITH CONTRAST TECHNIQUE: Multidetector CT imaging of the chest, abdomen and pelvis was performed following the standard protocol during bolus administration of intravenous contrast. CONTRAST:  1066mOMNIPAQUE IOHEXOL 300 MG/ML  SOLN COMPARISON:  MRI cervical spine 08/30/2018. FINDINGS: CT CHEST FINDINGS Cardiovascular: Heart is normal size. Scattered aortic arch calcifications. Aorta is normal caliber. No dissection. Mediastinum/Nodes: No mediastinal, hilar, or axillary adenopathy. Enlarged, heterogeneous appearance of the right thyroid lobe, likely replaced by large mass measuring 4.6 x 4.1 cm on axial imaging.  Small nodule in the left thyroid lobe. Lungs/Pleura: Numerous bilateral pulmonary nodules most compatible with metastases. Index lingular nodule measures 11 mm on image 74. Index left lower lobe nodule measures 7 mm on image 82. Index right lower lobe nodule measures 7 mm on image 77. Numerous other similarly sized nodules. No effusions. Musculoskeletal: Pathologic fracture again seen at the C7 level. No additional focal bone lesion. CT ABDOMEN PELVIS FINDINGS Hepatobiliary: No focal hepatic abnormality. Gallbladder unremarkable. Pancreas: No focal abnormality or ductal dilatation. Spleen: No focal abnormality.  Normal size. Adrenals/Urinary Tract: Large enhancing mass within the lower pole of the right kidney measures 6.5 x 5.3 cm most compatible with renal cell carcinoma. Small cysts in the left kidney. No hydronephrosis. Urinary bladder unremarkable. Stomach/Bowel: Stomach, large and small bowel grossly unremarkable. Vascular/Lymphatic: Aortic atherosclerosis. No enlarged abdominal or pelvic lymph nodes. Reproductive: Prior hysterectomy.  No adnexal masses. Other: Trace free fluid in the pelvis.  No free air. Musculoskeletal: Postoperative changes in the lower lumbar spine posterior fusion. Destructive lytic lesion noted in the medial right iliac bone  measuring up to 3.1 cm. Probable early lesion with early cortical scalloping noted in the left iliac bone, measuring 2.1 cm. Mixed lytic and sclerotic lesion noted more inferiorly in the left iliac bone adjacent to the SI joint. IMPRESSION: Large enhancing mixed solid and cystic mass within the mid to lower pole of the right kidney compatible with renal cell carcinoma. This measures up to 6.5 cm. Numerous pulmonary nodules within both lungs compatible with pulmonary metastases. Lytic destructive lesion within the right medial iliac crest with probable early lesion in a similar position in the left iliac bone. Mixed lytic and sclerotic lesion more inferiorly within the left iliac bone. Findings compatible with bone metastases. Right thyroid nodule/mass measuring up to 4.6 cm. This may reflect multinodular goiter. This could be further characterized with thyroid ultrasound. Trace free fluid in the pelvis. Electronically Signed   By: Rolm Baptise M.D.   On: 08/31/2018 09:45   Mr Cervical Spine Wo Contrast  Result Date: 08/30/2018 CLINICAL DATA:  C7 fracture. Four weeks of neck pain radiating into both arms with numbness. EXAM: MRI CERVICAL SPINE WITHOUT CONTRAST TECHNIQUE: Multiplanar, multisequence MR imaging of the cervical spine was performed. No intravenous contrast was administered. COMPARISON:  Cervical spine radiographs 08/30/2018 and MRI 11/29/2011 FINDINGS: Alignment: Straightening/slight reversal of the normal cervical lordosis. Trace retrolisthesis of C2 on C3. Vertebrae: Interval C3-4 ACDF since the 2013 MRI with solid interbody arthrodesis. Pathologic C7 compression fracture with 60% height loss centrally. Complete replacement of normal marrow signal throughout the C7 vertebral body extending into the posterior elements bilaterally. 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 Tumor tumor also narrows the C7 and C8 neural foramina bilaterally. No  suspicious marrow lesions identified elsewhere in the cervical and included upper thoracic spine. Cord: Normal signal. Posterior Fossa, vertebral arteries, paraspinal tissues: Preserved vertebral artery flow voids. Partially visualized 6 cm right thyroid mass, new or much larger than in 2013. Disc levels: C2-3: Minimal disc bulging and uncovertebral spurring without stenosis. C3-4: ACDF. Mild bilateral osseous neural foraminal narrowing. No spinal stenosis. C4-5: Disc bulging, uncovertebral spurring, and mild right and moderate left facet arthrosis have progressed from 2013 and result in moderate right and mild left neural foraminal stenosis and borderline spinal stenosis. C5-6: Broad-based posterior disc osteophyte complex results in borderline spinal stenosis and mild right and moderate to severe left neural foraminal stenosis, stable to  slightly progressed from 2013. C6-7: Severe disc space narrowing. Moderate spinal stenosis due to epidural tumor. Moderate bilateral neural foraminal stenosis. C7-T1: Moderate to severe bilateral neural foraminal stenosis due to tumor. No significant spinal stenosis at the disc space level. IMPRESSION: 1. Pathologic C7 compression fracture with 60% height loss and epidural tumor resulting in moderate spinal stenosis and moderate to severe bilateral C7 and C8 neural foraminal stenosis. This could reflect multiple myeloma or metastatic disease. 2. Solid C3-4 ACDF without significant residual stenosis. 3. Progressive disc degeneration at C4-5 and C5-6 since 2013 with moderate to severe left neural foraminal stenosis at C5-6. Electronically Signed   By: Logan Bores M.D.   On: 08/30/2018 21:56   Ct Abdomen Pelvis W Contrast  Result Date: 08/31/2018 CLINICAL DATA:  Pathologic cervical spine fracture. Assessing for primary malignancy EXAM: CT CHEST, ABDOMEN, AND PELVIS WITH CONTRAST TECHNIQUE: Multidetector CT imaging of the chest, abdomen and pelvis was performed following the  standard protocol during bolus administration of intravenous contrast. CONTRAST:  170m OMNIPAQUE IOHEXOL 300 MG/ML  SOLN COMPARISON:  MRI cervical spine 08/30/2018. FINDINGS: CT CHEST FINDINGS Cardiovascular: Heart is normal size. Scattered aortic arch calcifications. Aorta is normal caliber. No dissection. Mediastinum/Nodes: No mediastinal, hilar, or axillary adenopathy. Enlarged, heterogeneous appearance of the right thyroid lobe, likely replaced by large mass measuring 4.6 x 4.1 cm on axial imaging. Small nodule in the left thyroid lobe. Lungs/Pleura: Numerous bilateral pulmonary nodules most compatible with metastases. Index lingular nodule measures 11 mm on image 74. Index left lower lobe nodule measures 7 mm on image 82. Index right lower lobe nodule measures 7 mm on image 77. Numerous other similarly sized nodules. No effusions. Musculoskeletal: Pathologic fracture again seen at the C7 level. No additional focal bone lesion. CT ABDOMEN PELVIS FINDINGS Hepatobiliary: No focal hepatic abnormality. Gallbladder unremarkable. Pancreas: No focal abnormality or ductal dilatation. Spleen: No focal abnormality.  Normal size. Adrenals/Urinary Tract: Large enhancing mass within the lower pole of the right kidney measures 6.5 x 5.3 cm most compatible with renal cell carcinoma. Small cysts in the left kidney. No hydronephrosis. Urinary bladder unremarkable. Stomach/Bowel: Stomach, large and small bowel grossly unremarkable. Vascular/Lymphatic: Aortic atherosclerosis. No enlarged abdominal or pelvic lymph nodes. Reproductive: Prior hysterectomy.  No adnexal masses. Other: Trace free fluid in the pelvis.  No free air. Musculoskeletal: Postoperative changes in the lower lumbar spine posterior fusion. Destructive lytic lesion noted in the medial right iliac bone measuring up to 3.1 cm. Probable early lesion with early cortical scalloping noted in the left iliac bone, measuring 2.1 cm. Mixed lytic and sclerotic lesion noted  more inferiorly in the left iliac bone adjacent to the SI joint. IMPRESSION: Large enhancing mixed solid and cystic mass within the mid to lower pole of the right kidney compatible with renal cell carcinoma. This measures up to 6.5 cm. Numerous pulmonary nodules within both lungs compatible with pulmonary metastases. Lytic destructive lesion within the right medial iliac crest with probable early lesion in a similar position in the left iliac bone. Mixed lytic and sclerotic lesion more inferiorly within the left iliac bone. Findings compatible with bone metastases. Right thyroid nodule/mass measuring up to 4.6 cm. This may reflect multinodular goiter. This could be further characterized with thyroid ultrasound. Trace free fluid in the pelvis. Electronically Signed   By: KRolm BaptiseM.D.   On: 08/31/2018 09:45      Assessment and Plan:   77year old woman with the following:  1.  Renal mass arising from  the right kidney measuring 6.5 cm associated with numerous pulmonary nodules as well as lytic bony lesions including the cervical spine.  The differential diagnosis was reviewed today with the patient and her daughter-in-law that was present at bedside.  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  Pathological confirmation will be needed before discussing treatment strategies.    I recommended proceeding with percutaneous biopsy of her kidney mass or any metastatic lesions and not her thyroid mass.  Once that is accomplished, I will arrange an outpatient follow-up for further evaluation for systemic therapy.  I will be in the form of oral targeted therapy versus immunotherapy.  I do not recommend a radical nephrectomy given the advanced nature of her disease and I do not think you will change her  outcome at this time.  2.  C7 compression fracture with epidural involvement: She is followed by neurosurgery and evaluated by radiation oncology.  The general consensus is to proceed with radiation therapy at this time.  Once that is completed, systemic therapy will be next.   3.  Thyroid mass: This could be goiter versus metastatic kidney disease into the thyroid.  Primary thyroid malignancy is a possibility but I do not think thyroid cancer is responsible for her metastatic disease as noted in her imaging studies.  4.  Disposition and follow-up: I will arrange follow-up at the cancer center upon her discharge to discuss the results of her biopsy and discuss systemic therapy.  This will occur after completing radiation therapy of her spine.  All questions were answered today to their satisfaction.  80  minutes was spent with the patient face-to-face today.  More than 50% of time was dedicated to reviewing imaging studies, discussing the differential diagnosis and coordinating her plan of care.Marland Kitchen

## 2018-09-01 NOTE — Progress Notes (Signed)
Subjective: Patient reports Significant improvement and neurologic function improved hand strength improved numbness decreased pain  Objective: Vital signs in last 24 hours: Temp:  [98 F (36.7 C)-98.2 F (36.8 C)] 98.1 F (36.7 C) (10/08 0746) Pulse Rate:  [65-69] 69 (10/08 0747) Resp:  [12-17] 16 (10/08 0747) BP: (125-155)/(50-67) 155/67 (10/08 0747) SpO2:  [97 %-100 %] 98 % (10/08 0747)  Intake/Output from previous day: 10/07 0701 - 10/08 0700 In: 1168.1 [I.V.:1068.1; IV Piggyback:100] Out: 875 [Urine:875] Intake/Output this shift: No intake/output data recorded.  Strength improved in her hands neurologically stable  Lab Results: Recent Labs    08/30/18 2047  WBC 6.2  HGB 11.3*  HCT 33.2*  PLT 256   BMET Recent Labs    08/30/18 2047 08/31/18 0419  NA 139 138  K 4.6 4.1  CL 104 106  CO2 25 23  GLUCOSE 121* 158*  BUN 23 18  CREATININE 1.07* 0.92  CALCIUM 9.8 9.4    Studies/Results: Dg Cervical Spine 2-3 Views  Result Date: 08/30/2018 CLINICAL DATA:  Neck pain, prior fusion in 2013 EXAM: CERVICAL SPINE - 2-3 VIEW COMPARISON:  02/25/2012 FINDINGS: Prevertebral soft tissues normal thickness. Bones demineralized. Prior anterior fusion of C3-C4 with plate/screws and incorporated bone plugs. Superior endplate compression fracture of C7, new since 2013, with approximately 40% anterior height loss. Disc space narrowing with endplate spur formation at C5-C6 and C6-C7. Bulky anterior spurs at C2-C3 new since previous study. Scattered mild facet degenerative changes. No additional fracture or subluxation. IMPRESSION: Prior anterior fusion of C3-C4. New superior endplate compression fracture of C7 since 2013 with approximately 40% anterior height loss. Scattered degenerative disc and facet disease changes of the cervical spine. Electronically Signed   By: Lavonia Dana M.D.   On: 08/30/2018 18:50   Dg Lumbar Spine 2-3 Views  Result Date: 08/30/2018 CLINICAL DATA:  Back pain,  fell 2 weeks ago, lumbar surgery 2017 EXAM: LUMBAR SPINE - 2-3 VIEW COMPARISON:  02/27/2018 FINDINGS: 5 non-rib-bearing lumbar vertebra. Bones demineralized. BILATERAL pedicle screws and posterior bars L3-L5 with disc prosthesis at L4-L5. Hardware appears intact. Diffuse disc space narrowing. No fracture, subluxation or bone destruction. SI joints preserved. IMPRESSION: Postsurgical changes of posterior fusion L3-L5. Osseous demineralization with degenerative disc disease changes lumbar spine. No acute abnormalities. Electronically Signed   By: Lavonia Dana M.D.   On: 08/30/2018 18:46   Dg Shoulder Right  Result Date: 08/30/2018 CLINICAL DATA:  RIGHT shoulder pain EXAM: RIGHT SHOULDER - 2+ VIEW COMPARISON:  None FINDINGS: Osseous demineralization. Degenerative changes at [facility] joint with joint space narrowing and spur formation. No glenohumeral fracture or dislocation. Calcified bodies identified adjacent to proximal RIGHT humeral metadiaphysis. Visualized RIGHT ribs intact. IMPRESSION: Osseous demineralization with degenerative changes RIGHT AC joint. No acute abnormalities. Questionable calcified loose bodies adjacent to the proximal humerus. Electronically Signed   By: Lavonia Dana M.D.   On: 08/30/2018 18:43   Ct Soft Tissue Neck W Contrast  Result Date: 08/31/2018 CLINICAL DATA:  Mediastinal mass, substernal thyroid suspected. Pathologic fracture of the cervical spine. EXAM: CT NECK WITH CONTRAST TECHNIQUE: Multidetector CT imaging of the neck was performed using the standard protocol following the bolus administration of intravenous contrast. CONTRAST:  164m OMNIPAQUE IOHEXOL 300 MG/ML  SOLN COMPARISON:  MRI of the cervical spine 08/30/2018. CT of the cervical spine 11/28/2011. FINDINGS: Pharynx and larynx: Asymmetric right-sided soft tissue is present at the palatine tonsils. Calcifications are present. This likely represents chronic inflammation. No definite mass lesion is present.  No focal mucosal or  submucosal lesion is present. Vocal cords are midline and symmetric. Salivary glands: The submandibular and parotid glands are normal. Thyroid: Asymmetric heterogeneous nodule is present within the right lobe of the thyroid. The nodule measures 3.8 x 5.4 x 3.9 cm. The right-sided nodule has increased significantly since 2013. There smaller nodules in the left lobe of the thyroid. Lymph nodes: No significant cervical adenopathy is present. Vascular: Atherosclerotic changes are present at the aortic arch. No significant vascular lesions are present in the neck. Limited intracranial: Within normal limits. Visualized orbits: Visualized globes and orbits are within normal limits. Mastoids and visualized paranasal sinuses: The paranasal sinuses and mastoid air cells are clear. Skeleton: The pathologic fracture at C7 is again noted. No other focal lytic lesions are present. Anterior fusion is present at C3-4. Upper chest: Multiple bilateral lung nodules are present. The largest right upper lobe nodule measures 7.5 mm. Two 7 mm left upper lobe nodules are present. IMPRESSION: 1. Lytic pathologic compression fracture at T7 with associated central canal stenosis. 2. Multiple bilateral pulmonary nodules consistent with metastatic disease. 3. No other osseous lesions. 4. Marked increase in size of heterogeneous right thyroid nodule measuring 3.8 x 5.4 x 3.9 cm. While this may represent malignancy, it remains in capsulated. The lesion is amenable to ultrasound-guided biopsy. Other sources for primary tumor should be evaluated as well. CT of the chest, abdomen, and pelvis is being performed concurrently. Electronically Signed   By: San Morelle M.D.   On: 08/31/2018 11:16   Ct Chest W Contrast  Result Date: 08/31/2018 CLINICAL DATA:  Pathologic cervical spine fracture. Assessing for primary malignancy EXAM: CT CHEST, ABDOMEN, AND PELVIS WITH CONTRAST TECHNIQUE: Multidetector CT imaging of the chest, abdomen and pelvis  was performed following the standard protocol during bolus administration of intravenous contrast. CONTRAST:  166m OMNIPAQUE IOHEXOL 300 MG/ML  SOLN COMPARISON:  MRI cervical spine 08/30/2018. FINDINGS: CT CHEST FINDINGS Cardiovascular: Heart is normal size. Scattered aortic arch calcifications. Aorta is normal caliber. No dissection. Mediastinum/Nodes: No mediastinal, hilar, or axillary adenopathy. Enlarged, heterogeneous appearance of the right thyroid lobe, likely replaced by large mass measuring 4.6 x 4.1 cm on axial imaging. Small nodule in the left thyroid lobe. Lungs/Pleura: Numerous bilateral pulmonary nodules most compatible with metastases. Index lingular nodule measures 11 mm on image 74. Index left lower lobe nodule measures 7 mm on image 82. Index right lower lobe nodule measures 7 mm on image 77. Numerous other similarly sized nodules. No effusions. Musculoskeletal: Pathologic fracture again seen at the C7 level. No additional focal bone lesion. CT ABDOMEN PELVIS FINDINGS Hepatobiliary: No focal hepatic abnormality. Gallbladder unremarkable. Pancreas: No focal abnormality or ductal dilatation. Spleen: No focal abnormality.  Normal size. Adrenals/Urinary Tract: Large enhancing mass within the lower pole of the right kidney measures 6.5 x 5.3 cm most compatible with renal cell carcinoma. Small cysts in the left kidney. No hydronephrosis. Urinary bladder unremarkable. Stomach/Bowel: Stomach, large and small bowel grossly unremarkable. Vascular/Lymphatic: Aortic atherosclerosis. No enlarged abdominal or pelvic lymph nodes. Reproductive: Prior hysterectomy.  No adnexal masses. Other: Trace free fluid in the pelvis.  No free air. Musculoskeletal: Postoperative changes in the lower lumbar spine posterior fusion. Destructive lytic lesion noted in the medial right iliac bone measuring up to 3.1 cm. Probable early lesion with early cortical scalloping noted in the left iliac bone, measuring 2.1 cm. Mixed lytic  and sclerotic lesion noted more inferiorly in the left iliac bone adjacent to the SI joint.  IMPRESSION: Large enhancing mixed solid and cystic mass within the mid to lower pole of the right kidney compatible with renal cell carcinoma. This measures up to 6.5 cm. Numerous pulmonary nodules within both lungs compatible with pulmonary metastases. Lytic destructive lesion within the right medial iliac crest with probable early lesion in a similar position in the left iliac bone. Mixed lytic and sclerotic lesion more inferiorly within the left iliac bone. Findings compatible with bone metastases. Right thyroid nodule/mass measuring up to 4.6 cm. This may reflect multinodular goiter. This could be further characterized with thyroid ultrasound. Trace free fluid in the pelvis. Electronically Signed   By: Rolm Baptise M.D.   On: 08/31/2018 09:45   Mr Cervical Spine Wo Contrast  Result Date: 08/30/2018 CLINICAL DATA:  C7 fracture. Four weeks of neck pain radiating into both arms with numbness. EXAM: MRI CERVICAL SPINE WITHOUT CONTRAST TECHNIQUE: Multiplanar, multisequence MR imaging of the cervical spine was performed. No intravenous contrast was administered. COMPARISON:  Cervical spine radiographs 08/30/2018 and MRI 11/29/2011 FINDINGS: Alignment: Straightening/slight reversal of the normal cervical lordosis. Trace retrolisthesis of C2 on C3. Vertebrae: Interval C3-4 ACDF since the 2013 MRI with solid interbody arthrodesis. Pathologic C7 compression fracture with 60% height loss centrally. Complete replacement of normal marrow signal throughout the C7 vertebral body extending into the posterior elements bilaterally. 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 Tumor tumor also narrows the C7 and C8 neural foramina bilaterally. No suspicious marrow lesions identified elsewhere in the cervical and included upper thoracic spine. Cord: Normal signal. Posterior Fossa,  vertebral arteries, paraspinal tissues: Preserved vertebral artery flow voids. Partially visualized 6 cm right thyroid mass, new or much larger than in 2013. Disc levels: C2-3: Minimal disc bulging and uncovertebral spurring without stenosis. C3-4: ACDF. Mild bilateral osseous neural foraminal narrowing. No spinal stenosis. C4-5: Disc bulging, uncovertebral spurring, and mild right and moderate left facet arthrosis have progressed from 2013 and result in moderate right and mild left neural foraminal stenosis and borderline spinal stenosis. C5-6: Broad-based posterior disc osteophyte complex results in borderline spinal stenosis and mild right and moderate to severe left neural foraminal stenosis, stable to slightly progressed from 2013. C6-7: Severe disc space narrowing. Moderate spinal stenosis due to epidural tumor. Moderate bilateral neural foraminal stenosis. C7-T1: Moderate to severe bilateral neural foraminal stenosis due to tumor. No significant spinal stenosis at the disc space level. IMPRESSION: 1. Pathologic C7 compression fracture with 60% height loss and epidural tumor resulting in moderate spinal stenosis and moderate to severe bilateral C7 and C8 neural foraminal stenosis. This could reflect multiple myeloma or metastatic disease. 2. Solid C3-4 ACDF without significant residual stenosis. 3. Progressive disc degeneration at C4-5 and C5-6 since 2013 with moderate to severe left neural foraminal stenosis at C5-6. Electronically Signed   By: Logan Bores M.D.   On: 08/30/2018 21:56   Ct Abdomen Pelvis W Contrast  Result Date: 08/31/2018 CLINICAL DATA:  Pathologic cervical spine fracture. Assessing for primary malignancy EXAM: CT CHEST, ABDOMEN, AND PELVIS WITH CONTRAST TECHNIQUE: Multidetector CT imaging of the chest, abdomen and pelvis was performed following the standard protocol during bolus administration of intravenous contrast. CONTRAST:  174m OMNIPAQUE IOHEXOL 300 MG/ML  SOLN COMPARISON:  MRI  cervical spine 08/30/2018. FINDINGS: CT CHEST FINDINGS Cardiovascular: Heart is normal size. Scattered aortic arch calcifications. Aorta is normal caliber. No dissection. Mediastinum/Nodes: No mediastinal, hilar, or axillary adenopathy. Enlarged, heterogeneous appearance of the right thyroid lobe, likely replaced  by large mass measuring 4.6 x 4.1 cm on axial imaging. Small nodule in the left thyroid lobe. Lungs/Pleura: Numerous bilateral pulmonary nodules most compatible with metastases. Index lingular nodule measures 11 mm on image 74. Index left lower lobe nodule measures 7 mm on image 82. Index right lower lobe nodule measures 7 mm on image 77. Numerous other similarly sized nodules. No effusions. Musculoskeletal: Pathologic fracture again seen at the C7 level. No additional focal bone lesion. CT ABDOMEN PELVIS FINDINGS Hepatobiliary: No focal hepatic abnormality. Gallbladder unremarkable. Pancreas: No focal abnormality or ductal dilatation. Spleen: No focal abnormality.  Normal size. Adrenals/Urinary Tract: Large enhancing mass within the lower pole of the right kidney measures 6.5 x 5.3 cm most compatible with renal cell carcinoma. Small cysts in the left kidney. No hydronephrosis. Urinary bladder unremarkable. Stomach/Bowel: Stomach, large and small bowel grossly unremarkable. Vascular/Lymphatic: Aortic atherosclerosis. No enlarged abdominal or pelvic lymph nodes. Reproductive: Prior hysterectomy.  No adnexal masses. Other: Trace free fluid in the pelvis.  No free air. Musculoskeletal: Postoperative changes in the lower lumbar spine posterior fusion. Destructive lytic lesion noted in the medial right iliac bone measuring up to 3.1 cm. Probable early lesion with early cortical scalloping noted in the left iliac bone, measuring 2.1 cm. Mixed lytic and sclerotic lesion noted more inferiorly in the left iliac bone adjacent to the SI joint. IMPRESSION: Large enhancing mixed solid and cystic mass within the mid to  lower pole of the right kidney compatible with renal cell carcinoma. This measures up to 6.5 cm. Numerous pulmonary nodules within both lungs compatible with pulmonary metastases. Lytic destructive lesion within the right medial iliac crest with probable early lesion in a similar position in the left iliac bone. Mixed lytic and sclerotic lesion more inferiorly within the left iliac bone. Findings compatible with bone metastases. Right thyroid nodule/mass measuring up to 4.6 cm. This may reflect multinodular goiter. This could be further characterized with thyroid ultrasound. Trace free fluid in the pelvis. Electronically Signed   By: Rolm Baptise M.D.   On: 08/31/2018 09:45   Dg Chest Port 1 View  Result Date: 08/31/2018 CLINICAL DATA:  77 year old female. Preop for pathologic fracture with tumor replacement of C7 and cord compression. Initial encounter. EXAM: PORTABLE CHEST 1 VIEW COMPARISON:  12/01/2011 chest x-ray. FINDINGS: No infiltrate, congestive heart failure or pneumothorax. Nodularity peripheral aspect right upper lung possibly nodule versus confluence of shadows. Prominence right hilar region may reflect tortuous aorta and central pulmonary arteries. Difficult to evaluate for hilar lesion. Biapical pleural thickening without associated bony destruction. Impression upon the right aspect of the trachea. On 08/30/2018 cervical spine MR, there is a large mass within the right thyroid gland which may contribute to this appearance. Heart top-normal size. Acromioclavicular joint degenerative changes. IMPRESSION: 1. No infiltrate, congestive heart failure or pneumothorax. 2. Nodularity peripheral aspect right upper lung possibly nodule versus confluence of shadows. Prominence right hilar region may reflect tortuous aorta and central pulmonary arteries. Difficult to evaluate for hilar lesion. These findings can be reassessed on close follow-up two-view chest or with chest CT. 3. Impression upon the right  aspect of the trachea. On 08/30/2018 cervical spine MR, there is a large mass within the right thyroid gland which contributes to this appearance. These results will be called to the ordering clinician or representative by the Radiologist Assistant, and communication documented in the PACS or zVision Dashboard. Electronically Signed   By: Genia Del M.D.   On: 08/31/2018 07:34  Assessment/Plan: Continue work-up scheduled for FNA of thyroid mass today I have asked radiation oncology to see the patient recommend oncology consult and will continue to follow.  LOS: 1 day     Lygia Olaes P 09/01/2018, 7:52 AM

## 2018-09-01 NOTE — Progress Notes (Signed)
  [place] TEAM 1 - Stepdown/ICU TEAM  Late entry note  Returned to room at ~6PM when patient's son arrived. Discussed findings of CT scan, and picture c/w metastatic RCC. Discussed need for further eval to including imaging of the brain as well as Oncology evaluation.   Pt voiced understanding, and was understandably shaken by this news.  Cherene Altes, MD [facility] Office  [phone] Pager - Text Page per Amion as per below:  On-Call/Text Page:      [email or web address]      password [email or web address]  If 7PM-7AM, please contact night-coverage [email or web address] Password [facility] 09/01/2018, 8:38 AM

## 2018-09-01 NOTE — Progress Notes (Addendum)
PROGRESS NOTE  BAUDELIA SCHROEPFER ZOX:[id number] DOB: 1941/01/16 DOA: 08/31/2018 PCP: [name], MD  HPI/Recap of past 73 hours: 77 year old female with medical history significant for hypertension, presents to the ED at [facility] with neck, shoulder and back pain as well as bilateral lower extremity numbness for about 4 weeks.  MRI showed pathologic C7 compression fracture with 60% height loss and epidural tumor resulting in moderate spinal stenosis and moderate to severe bilateral C7 and C8 neural foraminal stenosis. This could reflect multiple myeloma or metastatic disease.  Neurosurgery consulted.  Patient admitted for further management.   Today, patient denied any new complaints, still reports some mild back pain, with some numbness on the left thigh.  Patient denies any chest pain, shortness of breath, abdominal pain, fever/chills.  Assessment/Plan: Principal Problem:   Pathologic fracture of cervical vertebra, initial encounter Active Problems:   HTN (hypertension)   Right renal mass   Pulmonary nodules   Bone metastases (HCC)  Extensive metastatic CA likely 2/2 ?renal cell CA CT abdomen/pelvis/chest showed: Large enhancing mixed solid and cystic mass of the right kidney compatible with renal cell carcinoma. This measures up to 6.5 cm. Numerous pulmonary nodules within both lungs compatible with pulmonary metastases. Right thyroid nodule/mass measuring up to 4.6 cm CT also showed: Lytic destructive lesion within the right medial iliac crest with probable early lesion in a similar position in the left iliac bone. Mixed lytic and sclerotic lesion more inferiorly within the left iliac bone. Findings compatible with bone metastases Oncology consulted, await further recs  Pathologic fracture C7 with foraminal stenosis Neurosurgery on board: Patient may require intervention depending upon primary CA Radiation oncology on board  Encapsulated right thyroid nodule Thyroid ultrasound  done  UTI UA positive for leukocytes, WBC UC with multiple species, suggest recollection Continue Rocephin x3 days    Code Status: Full  Family Communication: Daughter-in-law at bedside The patient requests that information related to her care be shared only with her son Merry Proud and his wife [name].  She prefers to wait until we have more concrete information prior to sharing the news with her son, Collier Salina   Disposition Plan: To be determined   Consultants:  Neurosurgery  Radiation oncology  Oncology  Procedures:  None  Antimicrobials:  Rocephin  DVT prophylaxis: Heparin   Objective: Vitals:   09/01/18 0746 09/01/18 0747 09/01/18 1151 09/01/18 1200  BP:  (!) 155/67    Pulse:  69    Resp:  16    Temp: 98.1 F (36.7 C)  97.9 F (36.6 C) 98.3 F (36.8 C)  TempSrc: Oral  Oral Oral  SpO2:  98%    Weight:      Height:        Intake/Output Summary (Last 24 hours) at 09/01/2018 1329 Last data filed at 09/01/2018 0800 Gross per 24 hour  Intake 2000 ml  Output 450 ml  Net 1550 ml   Filed Weights   08/31/18 0247  Weight: 84.6 kg    Exam:   General: NAD, neck collar  Cardiovascular: S1, S2 present  Respiratory: CTA B  Abdomen: Soft, nontender, distended, bowel sounds present  Musculoskeletal: No bilateral pedal edema  Skin: Normal  Psychiatry: Normal mood   Data Reviewed: CBC: Recent Labs  Lab 08/30/18 2047  WBC 6.2  HGB 11.3*  HCT 33.2*  MCV 90.3  PLT 811   Basic Metabolic Panel: Recent Labs  Lab 08/30/18 2047 08/31/18 0419  NA 139 138  K 4.6 4.1  CL  104 106  CO2 25 23  GLUCOSE 121* 158*  BUN 23 18  CREATININE 1.07* 0.92  CALCIUM 9.8 9.4   GFR: Estimated Creatinine Clearance: 50 mL/min (by C-G formula based on SCr of 0.92 mg/dL). Liver Function Tests: Recent Labs  Lab 08/30/18 2047  AST 18  ALT 10  ALKPHOS 101  BILITOT 0.4  PROT 7.9  ALBUMIN 4.0   No results for input(s): LIPASE, AMYLASE in the last 168  hours. No results for input(s): AMMONIA in the last 168 hours. Coagulation Profile: Recent Labs  Lab 08/31/18 0419  INR 1.12   Cardiac Enzymes: No results for input(s): CKTOTAL, CKMB, CKMBINDEX, TROPONINI in the last 168 hours. BNP (last 3 results) No results for input(s): PROBNP in the last 8760 hours. HbA1C: No results for input(s): HGBA1C in the last 72 hours. CBG: No results for input(s): GLUCAP in the last 168 hours. Lipid Profile: No results for input(s): CHOL, HDL, LDLCALC, TRIG, CHOLHDL, LDLDIRECT in the last 72 hours. Thyroid Function Tests: No results for input(s): TSH, T4TOTAL, FREET4, T3FREE, THYROIDAB in the last 72 hours. Anemia Panel: No results for input(s): VITAMINB12, FOLATE, FERRITIN, TIBC, IRON, RETICCTPCT in the last 72 hours. Urine analysis:    Component Value Date/Time   COLORURINE YELLOW (A) 08/30/2018 2047   APPEARANCEUR HAZY (A) 08/30/2018 2047   APPEARANCEUR Cloudy 11/28/2011 2236   LABSPEC 1.015 08/30/2018 2047   LABSPEC 1.010 11/28/2011 2236   PHURINE 5.0 08/30/2018 2047   GLUCOSEU NEGATIVE 08/30/2018 2047   GLUCOSEU Negative 11/28/2011 2236   HGBUR NEGATIVE 08/30/2018 2047   BILIRUBINUR NEGATIVE 08/30/2018 2047   BILIRUBINUR Negative 11/28/2011 2236   [place] NEGATIVE 08/30/2018 2047   PROTEINUR 30 (A) 08/30/2018 2047   NITRITE NEGATIVE 08/30/2018 2047   LEUKOCYTESUR MODERATE (A) 08/30/2018 2047   LEUKOCYTESUR Negative 11/28/2011 2236   Sepsis Labs: _0 (procalcitonin:4,lacticidven:4)  ) Recent Results (from the past 240 hour(s))  Urine culture     Status: Abnormal   Collection Time: 08/30/18  8:47 PM  Result Value Ref Range Status   Specimen Description   Final    URINE, RANDOM Performed at [facility], [address], [place], [place] [address]    Special Requests   Final    NONE Performed at [facility], [address] [place], [place] [address]    Culture MULTIPLE SPECIES PRESENT, SUGGEST  RECOLLECTION (A)  Final   Report Status 09/01/2018 FINAL  Final      Studies: US Thyroid  Result Date: 09/01/2018 CLINICAL DATA:  Palpable abnormality.  Thyroid mass. EXAM: THYROID ULTRASOUND TECHNIQUE: Ultrasound examination of the thyroid gland and adjacent soft tissues was performed. COMPARISON:  None. FINDINGS: Parenchymal Echotexture: Markedly heterogenous Isthmus: 0.4 cm Right lobe: 7.4 x 3.5 x 4.8 cm Left lobe: 5.1 x 1.5 x 2.1 cm _________________________________________________________ Estimated total number of nodules >/= 1 cm: 2 Number of spongiform nodules >/=  2 cm not described below (TR1): 0 Number of mixed cystic and solid nodules >/= 1.5 cm not described below (TR2): 0 _________________________________________________________ Nodule # 1: Location: Right; Mid Maximum size: 5.3 cm; Other 2 dimensions: 4.4 x 3.6 cm Composition: solid/almost completely solid (2) Echogenicity: hypoechoic (2) Shape: not taller-than-wide (0) Margins: smooth (0) Echogenic foci: none (0) ACR TI-RADS total points: 4. ACR TI-RADS risk category: TR4 (4-6 points). ACR TI-RADS recommendations: **Given size (>/= 1.5 cm) and appearance, fine needle aspiration of this moderately suspicious nodule should be considered based on TI-RADS criteria. _________________________________________________________ Nodule # 2: Location: Left;  Inferior Maximum size: 1.1 cm; Other 2 dimensions: 1.0 x 0.6 cm Composition: solid/almost completely solid (2) Echogenicity: isoechoic (1) Shape: not taller-than-wide (0) Margins: smooth (0) Echogenic foci: none (0) ACR TI-RADS total points: 3. ACR TI-RADS risk category: TR3 (3 points). ACR TI-RADS recommendations: Given size (<1.4 cm) and appearance, this nodule does NOT meet TI-RADS criteria for biopsy or dedicated follow-up. _________________________________________________________ Other nodules measure 0.9 cm or less in size and do not meet criteria for biopsy nor follow-up. IMPRESSION: Right  nodule 1 meets criteria for fine needle aspiration biopsy. Other nodules do not meet criteria for biopsy nor follow-up. The above is in keeping with the ACR TI-RADS recommendations - J Am Coll Radiol 2017;14:587-595. Electronically Signed   By: Marybelle Killings M.D.   On: 09/01/2018 10:37    Scheduled Meds: . dexamethasone  10 mg Intravenous Q6H  . gabapentin  100 mg Oral Q1200  . gabapentin  200 mg Oral BID  . heparin injection (subcutaneous)  5,000 Units Subcutaneous Q8H  . pantoprazole  40 mg Oral Q1200    Continuous Infusions: . sodium chloride 50 mL/hr at 09/01/18 0102  . cefTRIAXone (ROCEPHIN)  IV 1 g (09/01/18 0610)     LOS: 1 day     Alma Friendly, MD [facility]   If 7PM-7AM, please contact night-coverage [email or web address] 09/01/2018, 1:29 PM

## 2018-09-01 NOTE — Care Management Note (Signed)
Case Management Note  Patient Details  Name: Eileen Norris MRN: [id number] Date of Birth: 05/26/1941  Subjective/Objective:   Pt admitted on 08/31/18 with extensive metastatic CA, likely due to renal cell CA.  Pt with cystic mass of Rt kidney, numerous pulmonary nodules, and Rt thyroid nodule/mass.  CT also showed lesion of the Rt medial iliac crest, compatible with bone mets.  PTA, pt independent of ADLS.                    Action/Plan: Oncology following to facilitate treatment plan.  Will continue to follow progress/offer support.    Expected Discharge Date:                  Expected Discharge Plan:     In-House Referral:     Discharge planning Services     Post Acute Care Choice:    Choice offered to:     DME Arranged:    DME Agency:     HH Arranged:    HH Agency:     Status of Service:  In process, will continue to follow  If discussed at Long Length of Stay Meetings, dates discussed:    Additional Comments:  Reinaldo Raddle, RN, BSN  Trauma/Neuro ICU Case Manager [phone]

## 2018-09-01 NOTE — Consult Note (Signed)
Request for medical oncology consult received.  Full consult note to follow.  Imaging studies were reviewed including an MRI of the cervical spine as well as CT scan of the neck, chest, abdomen and pelvis.  These imaging studies indicate a large right kidney mass measuring 6.5 cm with pulmonary nodules as well as lytic lesions in the iliac crest as well as the iliac bone.  She also has pathological fracture in her cervical spine.  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  Tissue biopsy will be needed at this time.  This can be obtained via percutaneous biopsy of her kidney mass or or bony metastasis.  This could also be obtained via neurosurgery if decompressive surgery of C7 is planned.  I doubt that thyroid nodule has any impact on the diagnosis.

## 2018-09-01 NOTE — Consult Note (Addendum)
Radiation Oncology         [phone]) [phone] ________________________________  Initial inpatient Consultation  Name: Eileen Norris MRN: [id number]  Date of Service: 08/31/2018 DOB: 08-28-1941  CC:[name], MD  No ref. provider found   REFERRING PHYSICIAN: No ref. provider found Dr. Kary Kos  DIAGNOSIS: Diagnoses of Pre-op chest exam and Thyroid mass were pertinent to this visit.    ICD-10-CM   1. Pre-op chest exam Z01.811 DG CHEST PORT 1 VIEW    DG CHEST PORT 1 VIEW  2. Thyroid mass E07.9 US THYROID    US THYROID    HISTORY OF PRESENT ILLNESS: Eileen Norris is a 77 y.o. female seen at the request of Dr. Saintclair Halsted.  She recently presented to [facility] on 08/30/2018 with complaints of severe neck pain radiating into her shoulders and both upper extremities with associated intermittent numbness and tingling in her hands and legs ongoing for the past month and progressively worsening.  She has also had moderate pain in the right low back and hip region.  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  Additionally, there was a partially visualized 6 cm right thyroid mass which appeared new or at least much larger as compared to imaging from 2013.  The patient has no prior history of cancer but has had prior C3-4 fusion in 2013 and surgery on the lumbar spine in 2017 related to DDD (Dr. Rita Ohara).    She underwent a CT C/A/P and CT soft tissue neck on 08/31/18 for further evaluation and disease staging.  These studies demonstrated the lytic pathologic compression fracture at C7 with associated central canal stenosis, a 5.4 cm heterogeneous right thyroid nodule, multiple bilateral pulmonary nodules, a large 6.5 cm right renal mass and additional lytic appearing bony lesions in the right and  left iliac bones.    Since admission, she reports that her pain is well controlled and she is not currently having numbness or tingling in the arms or legs.  She denies any loss of bowel or bladder control, tremor or focal weakness in her legs.   She has continued taking gabapentin, previously prescribed by her neurologist, and was started on IV dexamethasone 13m IV q6hrs at time of admission.  PREVIOUS RADIATION THERAPY: No  PAST MEDICAL HISTORY:  Past Medical History:  Diagnosis Date  . Arthritis    Neck, knees, hands  . Bone metastases (HSitka 09/01/2018  . Dysrhythmia    "beating fast & flooding"  . GERD (gastroesophageal reflux disease)   . Hypertension       PAST SURGICAL HISTORY: Past Surgical History:  Procedure Laterality Date  . ABDOMINAL HYSTERECTOMY    . ANTERIOR CERVICAL DECOMP/DISCECTOMY FUSION  12/02/2011   Procedure: ANTERIOR CERVICAL DECOMPRESSION/DISCECTOMY FUSION 1 LEVEL;  Surgeon: RHosie Spangle  [facility];  Service: Neurosurgery;  Laterality: N/A;  Cervical three - four Anterior Cervical Decompression Fusion  . JOINT REPLACEMENT     bilateral knees,     FAMILY HISTORY:  Family History  Problem Relation Age of Onset  . Hypertension Mother     SOCIAL HISTORY:  Social History   Socioeconomic History  . Marital status: Single    Spouse name: Not on file  . Number of children: Not on file  . Years of education: Not on file  .  Highest education level: Not on file  Occupational History  . Not on file  Social Needs  . Financial resource strain: Not on file  . Food insecurity:    Worry: Not on file    Inability: Not on file  . Transportation needs:    Medical: Not on file    Non-medical: Not on file  Tobacco Use  . Smoking status: Never Smoker  . Smokeless tobacco: Never Used  Substance and Sexual Activity  . Alcohol use: No  . Drug use: No  . Sexual activity: Never  Lifestyle  . Physical activity:    Days per week: Not on file      Minutes per session: Not on file  . Stress: Not on file  Relationships  . Social connections:    Talks on phone: Not on file    Gets together: Not on file    Attends religious service: Not on file    Active member of club or organization: Not on file    Attends meetings of clubs or organizations: Not on file    Relationship status: Not on file  . Intimate partner violence:    Fear of current or ex partner: Not on file    Emotionally abused: Not on file    Physically abused: Not on file    Forced sexual activity: Not on file  Other Topics Concern  . Not on file  Social History Narrative  . Not on file    ALLERGIES: Advil [ibuprofen] and Prilosec [omeprazole]  MEDICATIONS:  Current Facility-Administered Medications  Medication Dose Route Frequency Provider Last Rate Last Dose  . 0.9 %  sodium chloride infusion   Intravenous Continuous Cherene Altes, MD 50 mL/hr at 09/01/18 0102    . acetaminophen (TYLENOL) tablet 650 mg  650 mg Oral Q6H PRN Etta Quill, DO   650 mg at 09/01/18 1139  . cefTRIAXone (ROCEPHIN) 1 g in sodium chloride 0.9 % 100 mL IVPB  1 g Intravenous Q0600 Jennette Kettle M, DO 200 mL/hr at 09/01/18 0610 1 g at 09/01/18 0610  . dexamethasone (DECADRON) injection 10 mg  10 mg Intravenous Q6H Cherene Altes, MD   10 mg at 09/01/18 1133  . docusate sodium (COLACE) capsule 100 mg  100 mg Oral Daily PRN Cherene Altes, MD      . gabapentin (NEURONTIN) capsule 100 mg  100 mg Oral Q1200 Ronna Polio, RPH      . gabapentin (NEURONTIN) capsule 200 mg  200 mg Oral BID Vertis Kelch L, RPH   200 mg at 09/01/18 1133  . heparin injection 5,000 Units  5,000 Units Subcutaneous Q8H Cherene Altes, MD   5,000 Units at 09/01/18 0606  . ondansetron (ZOFRAN) tablet 4 mg  4 mg Oral Q6H PRN Etta Quill, DO       Or  . ondansetron [facility]) injection 4 mg  4 mg Intravenous Q6H PRN Etta Quill, DO      . pantoprazole (PROTONIX) EC tablet 40 mg  40 mg  Oral Q1200 Cherene Altes, MD   40 mg at 09/01/18 1134    REVIEW OF SYSTEMS:  On review of systems, the patient reports that she is doing well overall. She denies any chest pain, shortness of breath, cough, fevers, chills, night sweats, unintended weight changes. She denies any bowel or bladder disturbances, and denies abdominal pain, nausea or vomiting. She denies any new musculoskeletal or joint aches or pains aside form that  mentioned above in the HPI regarding her neck and right sided low back/hip pain. A complete review of systems is obtained and is otherwise negative.   PHYSICAL EXAM:  Wt Readings from Last 3 Encounters:  08/31/18 186 lb 8.1 oz (84.6 kg)  08/30/18 180 lb (81.6 kg)  03/26/16 204 lb 6.4 oz (92.7 kg)   Temp Readings from Last 3 Encounters:  09/01/18 98.3 F (36.8 C) (Oral)  08/31/18 97.9 F (36.6 C) (Oral)  04/02/16 98.5 F (36.9 C) (Oral)   BP Readings from Last 3 Encounters:  09/01/18 (!) 155/67  08/31/18 (!) 153/78  02/28/17 (!) 109/59   Pulse Readings from Last 3 Encounters:  09/01/18 69  08/31/18 67  02/28/17 (!) 58   Pain Assessment Pain Score: 0-No pain/10  In general this is a well appearing African Americal female in no acute distress. She is alert and oriented x4 and appropriate throughout the examination. HEENT reveals that the patient is normocephalic, atraumatic. EOMs are intact. PERRLA. Skin is intact without any evidence of gross lesions. Cardiovascular exam reveals a regular rate and rhythm, no clicks rubs or murmurs are auscultated. Chest is clear to auscultation bilaterally. Lymphatic assessment is performed and does not reveal any adenopathy in the cervical, supraclavicular, axillary, or inguinal chains. Abdomen has active bowel sounds in all quadrants and is intact. The abdomen is soft, non tender, non distended. Lower extremities are negative for pretibial pitting edema, deep calf tenderness, cyanosis or clubbing.   KPS = 70  100 -  Normal; no complaints; no evidence of disease. 90   - Able to carry on normal activity; minor signs or symptoms of disease. 80   - Normal activity with effort; some signs or symptoms of disease. 23   - Cares for self; unable to carry on normal activity or to do active work. 60   - Requires occasional assistance, but is able to care for most of his personal needs. 50   - Requires considerable assistance and frequent medical care. 3   - Disabled; requires special care and assistance. 56   - Severely disabled; hospital admission is indicated although death not imminent. 15   - Very sick; hospital admission necessary; active supportive treatment necessary. 10   - Moribund; fatal processes progressing rapidly. 0     - Dead  Karnofsky DA, Abelmann Goldonna, Craver LS and Burchenal [facility] [phone]) The use of the nitrogen mustards in the palliative treatment of carcinoma: with particular reference to bronchogenic carcinoma Cancer 1 [id number]  LABORATORY DATA:  Lab Results  Component Value Date   WBC 6.2 08/30/2018   HGB 11.3 (L) 08/30/2018   HCT 33.2 (L) 08/30/2018   MCV 90.3 08/30/2018   PLT 256 08/30/2018   Lab Results  Component Value Date   NA 138 08/31/2018   K 4.1 08/31/2018   CL 106 08/31/2018   CO2 23 08/31/2018   Lab Results  Component Value Date   ALT 10 08/30/2018   AST 18 08/30/2018   ALKPHOS 101 08/30/2018   BILITOT 0.4 08/30/2018     RADIOGRAPHY: Dg Cervical Spine 2-3 Views  Result Date: 08/30/2018 CLINICAL DATA:  Neck pain, prior fusion in 2013 EXAM: CERVICAL SPINE - 2-3 VIEW COMPARISON:  02/25/2012 FINDINGS: Prevertebral soft tissues normal thickness. Bones demineralized. Prior anterior fusion of C3-C4 with plate/screws and incorporated bone plugs. Superior endplate compression fracture of C7, new since 2013, with approximately 40% anterior height loss. Disc space narrowing with endplate spur formation at C5-C6 and C6-C7.  Bulky anterior spurs at C2-C3 new since previous study.  Scattered mild facet degenerative changes. No additional fracture or subluxation. IMPRESSION: Prior anterior fusion of C3-C4. New superior endplate compression fracture of C7 since 2013 with approximately 40% anterior height loss. Scattered degenerative disc and facet disease changes of the cervical spine. Electronically Signed   By: Lavonia Dana M.D.   On: 08/30/2018 18:50   Dg Lumbar Spine 2-3 Views  Result Date: 08/30/2018 CLINICAL DATA:  Back pain, fell 2 weeks ago, lumbar surgery 2017 EXAM: LUMBAR SPINE - 2-3 VIEW COMPARISON:  02/27/2018 FINDINGS: 5 non-rib-bearing lumbar vertebra. Bones demineralized. BILATERAL pedicle screws and posterior bars L3-L5 with disc prosthesis at L4-L5. Hardware appears intact. Diffuse disc space narrowing. No fracture, subluxation or bone destruction. SI joints preserved. IMPRESSION: Postsurgical changes of posterior fusion L3-L5. Osseous demineralization with degenerative disc disease changes lumbar spine. No acute abnormalities. Electronically Signed   By: Lavonia Dana M.D.   On: 08/30/2018 18:46   Dg Shoulder Right  Result Date: 08/30/2018 CLINICAL DATA:  RIGHT shoulder pain EXAM: RIGHT SHOULDER - 2+ VIEW COMPARISON:  None FINDINGS: Osseous demineralization. Degenerative changes at [facility] joint with joint space narrowing and spur formation. No glenohumeral fracture or dislocation. Calcified bodies identified adjacent to proximal RIGHT humeral metadiaphysis. Visualized RIGHT ribs intact. IMPRESSION: Osseous demineralization with degenerative changes RIGHT AC joint. No acute abnormalities. Questionable calcified loose bodies adjacent to the proximal humerus. Electronically Signed   By: Lavonia Dana M.D.   On: 08/30/2018 18:43   Ct Soft Tissue Neck W Contrast  Result Date: 08/31/2018 CLINICAL DATA:  Mediastinal mass, substernal thyroid suspected. Pathologic fracture of the cervical spine. EXAM: CT NECK WITH CONTRAST TECHNIQUE: Multidetector CT imaging of the neck was performed  using the standard protocol following the bolus administration of intravenous contrast. CONTRAST:  134m OMNIPAQUE IOHEXOL 300 MG/ML  SOLN COMPARISON:  MRI of the cervical spine 08/30/2018. CT of the cervical spine 11/28/2011. FINDINGS: Pharynx and larynx: Asymmetric right-sided soft tissue is present at the palatine tonsils. Calcifications are present. This likely represents chronic inflammation. No definite mass lesion is present. No focal mucosal or submucosal lesion is present. Vocal cords are midline and symmetric. Salivary glands: The submandibular and parotid glands are normal. Thyroid: Asymmetric heterogeneous nodule is present within the right lobe of the thyroid. The nodule measures 3.8 x 5.4 x 3.9 cm. The right-sided nodule has increased significantly since 2013. There smaller nodules in the left lobe of the thyroid. Lymph nodes: No significant cervical adenopathy is present. Vascular: Atherosclerotic changes are present at the aortic arch. No significant vascular lesions are present in the neck. Limited intracranial: Within normal limits. Visualized orbits: Visualized globes and orbits are within normal limits. Mastoids and visualized paranasal sinuses: The paranasal sinuses and mastoid air cells are clear. Skeleton: The pathologic fracture at C7 is again noted. No other focal lytic lesions are present. Anterior fusion is present at C3-4. Upper chest: Multiple bilateral lung nodules are present. The largest right upper lobe nodule measures 7.5 mm. Two 7 mm left upper lobe nodules are present. IMPRESSION: 1. Lytic pathologic compression fracture at T7 with associated central canal stenosis. 2. Multiple bilateral pulmonary nodules consistent with metastatic disease. 3. No other osseous lesions. 4. Marked increase in size of heterogeneous right thyroid nodule measuring 3.8 x 5.4 x 3.9 cm. While this may represent malignancy, it remains in capsulated. The lesion is amenable to ultrasound-guided biopsy. Other  sources for primary tumor should be evaluated as well. CT of  the chest, abdomen, and pelvis is being performed concurrently. Electronically Signed   By: San Morelle M.D.   On: 08/31/2018 11:16   Ct Chest W Contrast  Result Date: 08/31/2018 CLINICAL DATA:  Pathologic cervical spine fracture. Assessing for primary malignancy EXAM: CT CHEST, ABDOMEN, AND PELVIS WITH CONTRAST TECHNIQUE: Multidetector CT imaging of the chest, abdomen and pelvis was performed following the standard protocol during bolus administration of intravenous contrast. CONTRAST:  135m OMNIPAQUE IOHEXOL 300 MG/ML  SOLN COMPARISON:  MRI cervical spine 08/30/2018. FINDINGS: CT CHEST FINDINGS Cardiovascular: Heart is normal size. Scattered aortic arch calcifications. Aorta is normal caliber. No dissection. Mediastinum/Nodes: No mediastinal, hilar, or axillary adenopathy. Enlarged, heterogeneous appearance of the right thyroid lobe, likely replaced by large mass measuring 4.6 x 4.1 cm on axial imaging. Small nodule in the left thyroid lobe. Lungs/Pleura: Numerous bilateral pulmonary nodules most compatible with metastases. Index lingular nodule measures 11 mm on image 74. Index left lower lobe nodule measures 7 mm on image 82. Index right lower lobe nodule measures 7 mm on image 77. Numerous other similarly sized nodules. No effusions. Musculoskeletal: Pathologic fracture again seen at the C7 level. No additional focal bone lesion. CT ABDOMEN PELVIS FINDINGS Hepatobiliary: No focal hepatic abnormality. Gallbladder unremarkable. Pancreas: No focal abnormality or ductal dilatation. Spleen: No focal abnormality.  Normal size. Adrenals/Urinary Tract: Large enhancing mass within the lower pole of the right kidney measures 6.5 x 5.3 cm most compatible with renal cell carcinoma. Small cysts in the left kidney. No hydronephrosis. Urinary bladder unremarkable. Stomach/Bowel: Stomach, large and small bowel grossly unremarkable. Vascular/Lymphatic:  Aortic atherosclerosis. No enlarged abdominal or pelvic lymph nodes. Reproductive: Prior hysterectomy.  No adnexal masses. Other: Trace free fluid in the pelvis.  No free air. Musculoskeletal: Postoperative changes in the lower lumbar spine posterior fusion. Destructive lytic lesion noted in the medial right iliac bone measuring up to 3.1 cm. Probable early lesion with early cortical scalloping noted in the left iliac bone, measuring 2.1 cm. Mixed lytic and sclerotic lesion noted more inferiorly in the left iliac bone adjacent to the SI joint. IMPRESSION: Large enhancing mixed solid and cystic mass within the mid to lower pole of the right kidney compatible with renal cell carcinoma. This measures up to 6.5 cm. Numerous pulmonary nodules within both lungs compatible with pulmonary metastases. Lytic destructive lesion within the right medial iliac crest with probable early lesion in a similar position in the left iliac bone. Mixed lytic and sclerotic lesion more inferiorly within the left iliac bone. Findings compatible with bone metastases. Right thyroid nodule/mass measuring up to 4.6 cm. This may reflect multinodular goiter. This could be further characterized with thyroid ultrasound. Trace free fluid in the pelvis. Electronically Signed   By: KRolm BaptiseM.D.   On: 08/31/2018 09:45   Mr Cervical Spine Wo Contrast  Result Date: 08/30/2018 CLINICAL DATA:  C7 fracture. Four weeks of neck pain radiating into both arms with numbness. EXAM: MRI CERVICAL SPINE WITHOUT CONTRAST TECHNIQUE: Multiplanar, multisequence MR imaging of the cervical spine was performed. No intravenous contrast was administered. COMPARISON:  Cervical spine radiographs 08/30/2018 and MRI 11/29/2011 FINDINGS: Alignment: Straightening/slight reversal of the normal cervical lordosis. Trace retrolisthesis of C2 on C3. Vertebrae: Interval C3-4 ACDF since the 2013 MRI with solid interbody arthrodesis. Pathologic C7 compression fracture with 60%  height loss centrally. Complete replacement of normal marrow signal throughout the C7 vertebral body extending into the posterior elements bilaterally. Ventral epidural tumor at C7 results in moderate spinal  stenosis with slight cord flattening. Tumor tumor also narrows the C7 and C8 neural foramina bilaterally. No suspicious marrow lesions identified elsewhere in the cervical and included upper thoracic spine. Cord: Normal signal. Posterior Fossa, vertebral arteries, paraspinal tissues: Preserved vertebral artery flow voids. Partially visualized 6 cm right thyroid mass, new or much larger than in 2013. Disc levels: C2-3: Minimal disc bulging and uncovertebral spurring without stenosis. C3-4: ACDF. Mild bilateral osseous neural foraminal narrowing. No spinal stenosis. C4-5: Disc bulging, uncovertebral spurring, and mild right and moderate left facet arthrosis have progressed from 2013 and result in moderate right and mild left neural foraminal stenosis and borderline spinal stenosis. C5-6: Broad-based posterior disc osteophyte complex results in borderline spinal stenosis and mild right and moderate to severe left neural foraminal stenosis, stable to slightly progressed from 2013. C6-7: Severe disc space narrowing. Moderate spinal stenosis due to epidural tumor. Moderate bilateral neural foraminal stenosis. C7-T1: Moderate to severe bilateral neural foraminal stenosis due to tumor. No significant spinal stenosis at the disc space level. IMPRESSION: 1. Pathologic C7 compression fracture with 60% height loss and epidural tumor resulting in moderate spinal stenosis and moderate to severe bilateral C7 and C8 neural foraminal stenosis. This could reflect multiple myeloma or metastatic disease. 2. Solid C3-4 ACDF without significant residual stenosis. 3. Progressive disc degeneration at C4-5 and C5-6 since 2013 with moderate to severe left neural foraminal stenosis at C5-6. Electronically Signed   By: Logan Bores M.D.    On: 08/30/2018 21:56   Ct Abdomen Pelvis W Contrast  Result Date: 08/31/2018 CLINICAL DATA:  Pathologic cervical spine fracture. Assessing for primary malignancy EXAM: CT CHEST, ABDOMEN, AND PELVIS WITH CONTRAST TECHNIQUE: Multidetector CT imaging of the chest, abdomen and pelvis was performed following the standard protocol during bolus administration of intravenous contrast. CONTRAST:  18m OMNIPAQUE IOHEXOL 300 MG/ML  SOLN COMPARISON:  MRI cervical spine 08/30/2018. FINDINGS: CT CHEST FINDINGS Cardiovascular: Heart is normal size. Scattered aortic arch calcifications. Aorta is normal caliber. No dissection. Mediastinum/Nodes: No mediastinal, hilar, or axillary adenopathy. Enlarged, heterogeneous appearance of the right thyroid lobe, likely replaced by large mass measuring 4.6 x 4.1 cm on axial imaging. Small nodule in the left thyroid lobe. Lungs/Pleura: Numerous bilateral pulmonary nodules most compatible with metastases. Index lingular nodule measures 11 mm on image 74. Index left lower lobe nodule measures 7 mm on image 82. Index right lower lobe nodule measures 7 mm on image 77. Numerous other similarly sized nodules. No effusions. Musculoskeletal: Pathologic fracture again seen at the C7 level. No additional focal bone lesion. CT ABDOMEN PELVIS FINDINGS Hepatobiliary: No focal hepatic abnormality. Gallbladder unremarkable. Pancreas: No focal abnormality or ductal dilatation. Spleen: No focal abnormality.  Normal size. Adrenals/Urinary Tract: Large enhancing mass within the lower pole of the right kidney measures 6.5 x 5.3 cm most compatible with renal cell carcinoma. Small cysts in the left kidney. No hydronephrosis. Urinary bladder unremarkable. Stomach/Bowel: Stomach, large and small bowel grossly unremarkable. Vascular/Lymphatic: Aortic atherosclerosis. No enlarged abdominal or pelvic lymph nodes. Reproductive: Prior hysterectomy.  No adnexal masses. Other: Trace free fluid in the pelvis.  No free  air. Musculoskeletal: Postoperative changes in the lower lumbar spine posterior fusion. Destructive lytic lesion noted in the medial right iliac bone measuring up to 3.1 cm. Probable early lesion with early cortical scalloping noted in the left iliac bone, measuring 2.1 cm. Mixed lytic and sclerotic lesion noted more inferiorly in the left iliac bone adjacent to the SI joint. IMPRESSION: Large enhancing mixed solid  and cystic mass within the mid to lower pole of the right kidney compatible with renal cell carcinoma. This measures up to 6.5 cm. Numerous pulmonary nodules within both lungs compatible with pulmonary metastases. Lytic destructive lesion within the right medial iliac crest with probable early lesion in a similar position in the left iliac bone. Mixed lytic and sclerotic lesion more inferiorly within the left iliac bone. Findings compatible with bone metastases. Right thyroid nodule/mass measuring up to 4.6 cm. This may reflect multinodular goiter. This could be further characterized with thyroid ultrasound. Trace free fluid in the pelvis. Electronically Signed   By: Rolm Baptise M.D.   On: 08/31/2018 09:45   Dg Chest Port 1 View  Result Date: 08/31/2018 CLINICAL DATA:  77 year old female. Preop for pathologic fracture with tumor replacement of C7 and cord compression. Initial encounter. EXAM: PORTABLE CHEST 1 VIEW COMPARISON:  12/01/2011 chest x-ray. FINDINGS: No infiltrate, congestive heart failure or pneumothorax. Nodularity peripheral aspect right upper lung possibly nodule versus confluence of shadows. Prominence right hilar region may reflect tortuous aorta and central pulmonary arteries. Difficult to evaluate for hilar lesion. Biapical pleural thickening without associated bony destruction. Impression upon the right aspect of the trachea. On 08/30/2018 cervical spine MR, there is a large mass within the right thyroid gland which may contribute to this appearance. Heart top-normal size.  Acromioclavicular joint degenerative changes. IMPRESSION: 1. No infiltrate, congestive heart failure or pneumothorax. 2. Nodularity peripheral aspect right upper lung possibly nodule versus confluence of shadows. Prominence right hilar region may reflect tortuous aorta and central pulmonary arteries. Difficult to evaluate for hilar lesion. These findings can be reassessed on close follow-up two-view chest or with chest CT. 3. Impression upon the right aspect of the trachea. On 08/30/2018 cervical spine MR, there is a large mass within the right thyroid gland which contributes to this appearance. These results will be called to the ordering clinician or representative by the Radiologist Assistant, and communication documented in the PACS or [employer]. Electronically Signed   By: Genia Del M.D.   On: 08/31/2018 07:34   US Thyroid  Result Date: 09/01/2018 CLINICAL DATA:  Palpable abnormality.  Thyroid mass. EXAM: THYROID ULTRASOUND TECHNIQUE: Ultrasound examination of the thyroid gland and adjacent soft tissues was performed. COMPARISON:  None. FINDINGS: Parenchymal Echotexture: Markedly heterogenous Isthmus: 0.4 cm Right lobe: 7.4 x 3.5 x 4.8 cm Left lobe: 5.1 x 1.5 x 2.1 cm _________________________________________________________ Estimated total number of nodules >/= 1 cm: 2 Number of spongiform nodules >/=  2 cm not described below (TR1): 0 Number of mixed cystic and solid nodules >/= 1.5 cm not described below (TR2): 0 _________________________________________________________ Nodule # 1: Location: Right; Mid Maximum size: 5.3 cm; Other 2 dimensions: 4.4 x 3.6 cm Composition: solid/almost completely solid (2) Echogenicity: hypoechoic (2) Shape: not taller-than-wide (0) Margins: smooth (0) Echogenic foci: none (0) ACR TI-RADS total points: 4. ACR TI-RADS risk category: TR4 (4-6 points). ACR TI-RADS recommendations: **Given size (>/= 1.5 cm) and appearance, fine needle aspiration of this moderately  suspicious nodule should be considered based on TI-RADS criteria. _________________________________________________________ Nodule # 2: Location: Left; Inferior Maximum size: 1.1 cm; Other 2 dimensions: 1.0 x 0.6 cm Composition: solid/almost completely solid (2) Echogenicity: isoechoic (1) Shape: not taller-than-wide (0) Margins: smooth (0) Echogenic foci: none (0) ACR TI-RADS total points: 3. ACR TI-RADS risk category: TR3 (3 points). ACR TI-RADS recommendations: Given size (<1.4 cm) and appearance, this nodule does NOT meet TI-RADS criteria for biopsy or dedicated follow-up. _________________________________________________________  Other nodules measure 0.9 cm or less in size and do not meet criteria for biopsy nor follow-up. IMPRESSION: Right nodule 1 meets criteria for fine needle aspiration biopsy. Other nodules do not meet criteria for biopsy nor follow-up. The above is in keeping with the ACR TI-RADS recommendations - J Am Coll Radiol 2017;14:587-595. Electronically Signed   By: Marybelle Killings M.D.   On: 09/01/2018 10:37      IMPRESSION/PLAN: 1. 77 y.o. 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 Today, I talked to the patient and family about the findings and workup thus far. We reviewed her imaging results which show a large right renal mass, highly suspicious for RCC as well as a thyroid mass, multiple pulmonary nodules and bony metastasis at C7 as well as in the pelvis. We discussed the need for tissue confirmation which will help [place] formalize the best treatment approach for her disease. We discussed the natural history of metastatic carcinoma and general treatment, highlighting the role of radiotherapy in the management. We discussed the available radiation techniques, and focused on the details of logistics and delivery. If her primary disease is confirmed to be renal cell carcinoma, we would offer  stereotactic radiosurgery to the C7 lesion as well as palliative radiotherapy to the painful bony lesion in the right iliac.  We reviewed the anticipated acute and late sequelae associated with radiation in this setting. The patient was encouraged to ask questions that were answered to her satisfaction. She understands that we will continue to follow her progress, working closely with her care team and await final pathology prior to formalizing a treatment plan. The patient requests that information related to her care be shared only with her son Merry Proud and his wife [name].  She prefers to wait until we have more concrete information prior to sharing the news with her son, Collier Salina.     Nicholos Johns, PA-C    Tyler Pita, MD  [facility] Direct Dial: [phone]  Fax: [phone] [email or web address]  Skype  [email or web address]    Page Me

## 2018-09-02 ENCOUNTER — Encounter (HOSPITAL_COMMUNITY): Payer: Self-pay | Admitting: *Deleted

## 2018-09-02 ENCOUNTER — Telehealth: Payer: Self-pay | Admitting: Oncology

## 2018-09-02 ENCOUNTER — Inpatient Hospital Stay (HOSPITAL_COMMUNITY): Payer: Medicare Other

## 2018-09-02 ENCOUNTER — Telehealth: Payer: Self-pay | Admitting: Hematology and Oncology

## 2018-09-02 ENCOUNTER — Encounter: Payer: Self-pay | Admitting: Oncology

## 2018-09-02 LAB — BASIC METABOLIC PANEL
Anion gap: 7 (ref 5–15)
BUN: 30 mg/dL — AB (ref 8–23)
CHLORIDE: 110 mmol/L (ref 98–111)
CO2: 22 mmol/L (ref 22–32)
CREATININE: 1.1 mg/dL — AB (ref 0.44–1.00)
Calcium: 8.7 mg/dL — ABNORMAL LOW (ref 8.9–10.3)
GFR calc Af Amer: 54 mL/min — ABNORMAL LOW (ref >60)
GFR calc non Af Amer: 46 mL/min — ABNORMAL LOW (ref >60)
Glucose, Bld: 157 mg/dL — ABNORMAL HIGH (ref 70–99)
POTASSIUM: 3.9 mmol/L (ref 3.5–5.1)
Sodium: 139 mmol/L (ref 135–145)

## 2018-09-02 LAB — CBC WITH DIFFERENTIAL/PLATELET
Abs Immature Granulocytes: 0.01 10*3/uL (ref 0.00–0.07)
Basophils Absolute: 0 10*3/uL (ref 0.0–0.1)
Basophils Relative: 0 %
EOS PCT: 0 %
Eosinophils Absolute: 0 10*3/uL (ref 0.0–0.5)
HEMATOCRIT: 31.8 % — AB (ref 36.0–46.0)
HEMOGLOBIN: 9.6 g/dL — AB (ref 12.0–15.0)
Immature Granulocytes: 0 %
LYMPHS ABS: 0.9 10*3/uL (ref 0.7–4.0)
LYMPHS PCT: 17 %
MCH: 28.4 pg (ref 26.0–34.0)
MCHC: 30.2 g/dL (ref 30.0–36.0)
MCV: 94.1 fL (ref 80.0–100.0)
MONO ABS: 0.2 10*3/uL (ref 0.1–1.0)
MONOS PCT: 5 %
Neutro Abs: 4 10*3/uL (ref 1.7–7.7)
Neutrophils Relative %: 78 %
Platelets: 260 10*3/uL (ref 150–400)
RBC: 3.38 MIL/uL — ABNORMAL LOW (ref 3.87–5.11)
RDW: 13.3 % (ref 11.5–15.5)
WBC: 5.1 10*3/uL (ref 4.0–10.5)
nRBC: 0 % (ref 0.0–0.2)

## 2018-09-02 LAB — PROTEIN ELECTROPHORESIS, SERUM
A/G Ratio: 0.9 (ref 0.7–1.7)
Albumin ELP: 2.9 g/dL (ref 2.9–4.4)
Alpha-1-Globulin: 0.3 g/dL (ref 0.0–0.4)
Alpha-2-Globulin: 1.1 g/dL — ABNORMAL HIGH (ref 0.4–1.0)
Beta Globulin: 1 g/dL (ref 0.7–1.3)
GAMMA GLOBULIN: 1 g/dL (ref 0.4–1.8)
Globulin, Total: 3.4 g/dL (ref 2.2–3.9)
TOTAL PROTEIN ELP: 6.3 g/dL (ref 6.0–8.5)

## 2018-09-02 MED ORDER — FENTANYL CITRATE (PF) 100 MCG/2ML IJ SOLN
INTRAMUSCULAR | Status: AC | PRN
  Administered 2018-09-02: 50 ug via INTRAVENOUS

## 2018-09-02 MED ORDER — FENTANYL CITRATE (PF) 100 MCG/2ML IJ SOLN
INTRAMUSCULAR | Status: AC
  Filled 2018-09-02: qty 2

## 2018-09-02 MED ORDER — VITAMIN D 1000 UNITS PO TABS
1000.0000 [IU] | ORAL_TABLET | Freq: Every day | ORAL | Status: DC
  Administered 2018-09-02 – 2018-09-04 (×3): 1000 [IU] via ORAL
  Filled 2018-09-02 (×3): qty 1

## 2018-09-02 MED ORDER — METOPROLOL TARTRATE 50 MG PO TABS
50.0000 mg | ORAL_TABLET | Freq: Two times a day (BID) | ORAL | Status: DC
  Administered 2018-09-02 – 2018-09-04 (×3): 50 mg via ORAL
  Filled 2018-09-02 (×4): qty 1

## 2018-09-02 MED ORDER — FERROUS SULFATE 325 (65 FE) MG PO TABS
325.0000 mg | ORAL_TABLET | Freq: Every day | ORAL | Status: DC
  Administered 2018-09-03 – 2018-09-04 (×2): 325 mg via ORAL
  Filled 2018-09-02 (×4): qty 1

## 2018-09-02 MED ORDER — LIDOCAINE HCL 1 % IJ SOLN
INTRAMUSCULAR | Status: AC
  Administered 2018-09-02: 15:00:00
  Filled 2018-09-02: qty 20

## 2018-09-02 MED ORDER — NALOXONE HCL 0.4 MG/ML IJ SOLN
INTRAMUSCULAR | Status: AC
  Filled 2018-09-02: qty 1

## 2018-09-02 MED ORDER — LISINOPRIL 20 MG PO TABS
20.0000 mg | ORAL_TABLET | Freq: Every day | ORAL | Status: DC
  Administered 2018-09-03: 20 mg via ORAL
  Filled 2018-09-02: qty 1

## 2018-09-02 MED ORDER — MIDAZOLAM HCL 2 MG/2ML IJ SOLN
INTRAMUSCULAR | Status: AC | PRN
  Administered 2018-09-02: 1 mg via INTRAVENOUS

## 2018-09-02 MED ORDER — HYDROCHLOROTHIAZIDE 25 MG PO TABS
25.0000 mg | ORAL_TABLET | Freq: Every day | ORAL | Status: DC
  Administered 2018-09-02 – 2018-09-04 (×3): 25 mg via ORAL
  Filled 2018-09-02 (×3): qty 1

## 2018-09-02 MED ORDER — FLUMAZENIL 0.5 MG/5ML IV SOLN
INTRAVENOUS | Status: AC
  Filled 2018-09-02: qty 5

## 2018-09-02 MED ORDER — MIDAZOLAM HCL 2 MG/2ML IJ SOLN
INTRAMUSCULAR | Status: AC
  Filled 2018-09-02: qty 2

## 2018-09-02 NOTE — Procedures (Signed)
R iliac bone lesion Core biopsy 18 g times two EBL 0 Comp 0

## 2018-09-02 NOTE — H&P (Signed)
Chief Complaint: Patient was seen in consultation today for biopsy of lytic lesion of right iliac crest  Referring Physician(s): Dr. Zola Button  Supervising Physician: Marybelle Killings  Patient Status: [facility] - In-pt  History of Present Illness: Eileen Norris is a 77 y.o. female with a past medical history significant for GERD, HTN and anterior cervical decompression fusion (2013) who presented to [facility] ED on 08/30/18 with complaints of back, neck and shoulder pain x 4 weeks which radiated to both arms with intermittent numbness. She is followed by neurology for same however her pain continued to worsen. She denied any falls or trauma. X-ray cervical spine in ED showed new superior endplate compression fracture of C7 with approximately 40% height less, MR cervical spine showed pathologic C7 compression fracture with 60% height loss and epidural tumor resulting in moderate spinal stenosis and moderate to severe bilateral C7 and C8 neural foraminal stenosis.   Patient was transferred to [facility] for further evaluation and management. CT of chest, abdomen and pelvis were ordered to attempt to identify primary malignancy - CT scans showed large enhancing mixed solid and cystic mass within the mid to lower pole of the right kidney compatible with renal cell carcinoma, lytic destructive lesion within the right medial iliac crest and right thyroid. Oncology was consulted who requested biopsy of kidney mass or bony metastasis. She is also followed by Dr. Saintclair Halsted with neurosurgery for possible surgical decompression stabilization.  Patient reports that she has been feeling a little better since arriving at the hospital, numbness in upper extremities has improved. She only complains of neck pain on exam today. She states she is hopeful that biopsy today will provide some answers about her cancer.   Past Medical History:  Diagnosis Date  . Arthritis    Neck, knees, hands  . Bone metastases ([place]) 09/01/2018  .  Dysrhythmia    "beating fast & flooding"  . GERD (gastroesophageal reflux disease)   . Hypertension     Past Surgical History:  Procedure Laterality Date  . ABDOMINAL HYSTERECTOMY    . ANTERIOR CERVICAL DECOMP/DISCECTOMY FUSION  12/02/2011   Procedure: ANTERIOR CERVICAL DECOMPRESSION/DISCECTOMY FUSION 1 LEVEL;  Surgeon: Hosie Spangle;  Location: [place] [facility];  Service: Neurosurgery;  Laterality: N/A;  Cervical three - four Anterior Cervical Decompression Fusion  . JOINT REPLACEMENT     bilateral knees,     Allergies: Advil [ibuprofen] and Prilosec [omeprazole]  Medications: Prior to Admission medications   Medication Sig Start Date End Date Taking? Authorizing Provider  Cholecalciferol (VITAMIN D-3) 1000 units CAPS Take 1,000 Units by mouth daily.   Yes [provider]  docusate sodium (COLACE) 100 MG capsule Take 100 mg by mouth daily as needed for mild constipation.   Yes [provider]  ferrous sulfate 325 (65 FE) MG EC tablet Take 325 mg by mouth daily with breakfast.   Yes [provider]  gabapentin (NEURONTIN) 100 MG capsule Take 100 mg by mouth See admin instructions. Taking 2 capsules (278m) in the AM, then 1 capsule (1088m at noon, then 2 capsules (20034mat bedtime. 08/17/18  Yes [provider]  hydrochlorothiazide (HYDRODIURIL) 50 MG tablet Take 50 mg by mouth daily.  01/29/16  Yes [provider]  lisinopril (PRINIVIL,ZESTRIL) 20 MG tablet Take 20 mg by mouth daily.   Yes [provider]  meloxicam (MOBIC) 7.5 MG tablet Take 7.5 mg by mouth 2 times daily at 12 noon and 4 pm.   Yes [provider]  metoprolol (LOPRESSOR) 50 MG tablet Take 50 mg by mouth 2 (two) times daily.     Yes [provider]  aspirin EC 81 MG tablet Take 81 mg by mouth daily.    [provider]     Family History  Problem Relation Age of Onset  . Hypertension Mother     Social History   Socioeconomic History    . Marital status: Single    Spouse name: Not on file  . Number of children: Not on file  . Years of education: Not on file  . Highest education level: Not on file  Occupational History  . Not on file  Social Needs  . Financial resource strain: Not on file  . Food insecurity:    Worry: Not on file    Inability: Not on file  . Transportation needs:    Medical: Not on file    Non-medical: Not on file  Tobacco Use  . Smoking status: Never Smoker  . Smokeless tobacco: Never Used  Substance and Sexual Activity  . Alcohol use: No  . Drug use: No  . Sexual activity: Never  Lifestyle  . Physical activity:    Days per week: Not on file    Minutes per session: Not on file  . Stress: Not on file  Relationships  . Social connections:    Talks on phone: Not on file    Gets together: Not on file    Attends religious service: Not on file    Active member of club or organization: Not on file    Attends meetings of clubs or organizations: Not on file    Relationship status: Not on file  Other Topics Concern  . Not on file  Social History Narrative  . Not on file     Review of Systems: A 12 point ROS discussed and pertinent positives are indicated in the HPI above.  All other systems are negative.  Review of Systems  Constitutional: Negative for chills, fatigue and fever.  Respiratory: Negative for cough and shortness of breath.   Cardiovascular: Negative for chest pain.  Gastrointestinal: Negative for abdominal pain, nausea and vomiting.  Musculoskeletal: Positive for neck pain.  Neurological: Negative for syncope and light-headedness.  Psychiatric/Behavioral: Negative for confusion.    Vital Signs: BP (!) 169/56 (BP Location: Left Arm)   Pulse (!) 49   Temp 98 F (36.7 C) (Oral)   Resp (!) 7   Ht 5' 2" (1.575 m)   Wt 186 lb 8.1 oz (84.6 kg)   SpO2 100%   BMI 34.11 kg/m   Physical Exam  Constitutional: She is oriented to person, place, and time. No distress.  HENT:   Head: Normocephalic.  Cardiovascular: Normal rate, regular rhythm and normal heart sounds.  Pulmonary/Chest: Effort normal and breath sounds normal.  Abdominal: Soft. She exhibits no distension. There is no tenderness.  Musculoskeletal:  Neck collar in place  Neurological: She is alert and oriented to person, place, and time.  Skin: Skin is warm and dry. She is not diaphoretic.  Psychiatric: She has a normal mood and affect. Her behavior is normal. Judgment and thought content normal.  Nursing note and vitals reviewed.    MD Evaluation Airway: WNL Airway comments: neck collar in place Heart: WNL Abdomen: WNL Chest/ Lungs: WNL ASA  Classification: 3 Mallampati/Airway Score: Two   Imaging: Dg Cervical Spine 2-3 Views  Result Date: 08/30/2018 CLINICAL DATA:  Neck pain, prior fusion in 2013 EXAM:   CERVICAL SPINE - 2-3 VIEW COMPARISON:  02/25/2012 FINDINGS: Prevertebral soft tissues normal thickness. Bones demineralized. Prior anterior fusion of C3-C4 with plate/screws and incorporated bone plugs. Superior endplate compression fracture of C7, new since 2013, with approximately 40% anterior height loss. Disc space narrowing with endplate spur formation at C5-C6 and C6-C7. Bulky anterior spurs at C2-C3 new since previous study. Scattered mild facet degenerative changes. No additional fracture or subluxation. IMPRESSION: Prior anterior fusion of C3-C4. New superior endplate compression fracture of C7 since 2013 with approximately 40% anterior height loss. Scattered degenerative disc and facet disease changes of the cervical spine. Electronically Signed   By: Mark  Boles M.D.   On: 08/30/2018 18:50   Dg Lumbar Spine 2-3 Views  Result Date: 08/30/2018 CLINICAL DATA:  Back pain, fell 2 weeks ago, lumbar surgery 2017 EXAM: LUMBAR SPINE - 2-3 VIEW COMPARISON:  02/27/2018 FINDINGS: 5 non-rib-bearing lumbar vertebra. Bones demineralized. BILATERAL pedicle screws and posterior bars L3-L5 with disc  prosthesis at L4-L5. Hardware appears intact. Diffuse disc space narrowing. No fracture, subluxation or bone destruction. SI joints preserved. IMPRESSION: Postsurgical changes of posterior fusion L3-L5. Osseous demineralization with degenerative disc disease changes lumbar spine. No acute abnormalities. Electronically Signed   By: Mark  Boles M.D.   On: 08/30/2018 18:46   Dg Shoulder Right  Result Date: 08/30/2018 CLINICAL DATA:  RIGHT shoulder pain EXAM: RIGHT SHOULDER - 2+ VIEW COMPARISON:  None FINDINGS: Osseous demineralization. Degenerative changes at AC joint with joint space narrowing and spur formation. No glenohumeral fracture or dislocation. Calcified bodies identified adjacent to proximal RIGHT humeral metadiaphysis. Visualized RIGHT ribs intact. IMPRESSION: Osseous demineralization with degenerative changes RIGHT AC joint. No acute abnormalities. Questionable calcified loose bodies adjacent to the proximal humerus. Electronically Signed   By: Mark  Boles M.D.   On: 08/30/2018 18:43   Ct Soft Tissue Neck W Contrast  Result Date: 08/31/2018 CLINICAL DATA:  Mediastinal mass, substernal thyroid suspected. Pathologic fracture of the cervical spine. EXAM: CT NECK WITH CONTRAST TECHNIQUE: Multidetector CT imaging of the neck was performed using the standard protocol following the bolus administration of intravenous contrast. CONTRAST:  100mL OMNIPAQUE IOHEXOL 300 MG/ML  SOLN COMPARISON:  MRI of the cervical spine 08/30/2018. CT of the cervical spine 11/28/2011. FINDINGS: Pharynx and larynx: Asymmetric right-sided soft tissue is present at the palatine tonsils. Calcifications are present. This likely represents chronic inflammation. No definite mass lesion is present. No focal mucosal or submucosal lesion is present. Vocal cords are midline and symmetric. Salivary glands: The submandibular and parotid glands are normal. Thyroid: Asymmetric heterogeneous nodule is present within the right lobe of the  thyroid. The nodule measures 3.8 x 5.4 x 3.9 cm. The right-sided nodule has increased significantly since 2013. There smaller nodules in the left lobe of the thyroid. Lymph nodes: No significant cervical adenopathy is present. Vascular: Atherosclerotic changes are present at the aortic arch. No significant vascular lesions are present in the neck. Limited intracranial: Within normal limits. Visualized orbits: Visualized globes and orbits are within normal limits. Mastoids and visualized paranasal sinuses: The paranasal sinuses and mastoid air cells are clear. Skeleton: The pathologic fracture at C7 is again noted. No other focal lytic lesions are present. Anterior fusion is present at C3-4. Upper chest: Multiple bilateral lung nodules are present. The largest right upper lobe nodule measures 7.5 mm. Two 7 mm left upper lobe nodules are present. IMPRESSION: 1. Lytic pathologic compression fracture at T7 with associated central canal stenosis. 2. Multiple bilateral pulmonary nodules consistent with   metastatic disease. 3. No other osseous lesions. 4. Marked increase in size of heterogeneous right thyroid nodule measuring 3.8 x 5.4 x 3.9 cm. While this may represent malignancy, it remains in capsulated. The lesion is amenable to ultrasound-guided biopsy. Other sources for primary tumor should be evaluated as well. CT of the chest, abdomen, and pelvis is being performed concurrently. Electronically Signed   By: San Morelle M.D.   On: 08/31/2018 11:16   Ct Chest W Contrast  Result Date: 08/31/2018 CLINICAL DATA:  Pathologic cervical spine fracture. Assessing for primary malignancy EXAM: CT CHEST, ABDOMEN, AND PELVIS WITH CONTRAST TECHNIQUE: Multidetector CT imaging of the chest, abdomen and pelvis was performed following the standard protocol during bolus administration of intravenous contrast. CONTRAST:  11m OMNIPAQUE IOHEXOL 300 MG/ML  SOLN COMPARISON:  MRI cervical spine 08/30/2018. FINDINGS: CT CHEST  FINDINGS Cardiovascular: Heart is normal size. Scattered aortic arch calcifications. Aorta is normal caliber. No dissection. Mediastinum/Nodes: No mediastinal, hilar, or axillary adenopathy. Enlarged, heterogeneous appearance of the right thyroid lobe, likely replaced by large mass measuring 4.6 x 4.1 cm on axial imaging. Small nodule in the left thyroid lobe. Lungs/Pleura: Numerous bilateral pulmonary nodules most compatible with metastases. Index lingular nodule measures 11 mm on image 74. Index left lower lobe nodule measures 7 mm on image 82. Index right lower lobe nodule measures 7 mm on image 77. Numerous other similarly sized nodules. No effusions. Musculoskeletal: Pathologic fracture again seen at the C7 level. No additional focal bone lesion. CT ABDOMEN PELVIS FINDINGS Hepatobiliary: No focal hepatic abnormality. Gallbladder unremarkable. Pancreas: No focal abnormality or ductal dilatation. Spleen: No focal abnormality.  Normal size. Adrenals/Urinary Tract: Large enhancing mass within the lower pole of the right kidney measures 6.5 x 5.3 cm most compatible with renal cell carcinoma. Small cysts in the left kidney. No hydronephrosis. Urinary bladder unremarkable. Stomach/Bowel: Stomach, large and small bowel grossly unremarkable. Vascular/Lymphatic: Aortic atherosclerosis. No enlarged abdominal or pelvic lymph nodes. Reproductive: Prior hysterectomy.  No adnexal masses. Other: Trace free fluid in the pelvis.  No free air. Musculoskeletal: Postoperative changes in the lower lumbar spine posterior fusion. Destructive lytic lesion noted in the medial right iliac bone measuring up to 3.1 cm. Probable early lesion with early cortical scalloping noted in the left iliac bone, measuring 2.1 cm. Mixed lytic and sclerotic lesion noted more inferiorly in the left iliac bone adjacent to the SI joint. IMPRESSION: Large enhancing mixed solid and cystic mass within the mid to lower pole of the right kidney compatible with  renal cell carcinoma. This measures up to 6.5 cm. Numerous pulmonary nodules within both lungs compatible with pulmonary metastases. Lytic destructive lesion within the right medial iliac crest with probable early lesion in a similar position in the left iliac bone. Mixed lytic and sclerotic lesion more inferiorly within the left iliac bone. Findings compatible with bone metastases. Right thyroid nodule/mass measuring up to 4.6 cm. This may reflect multinodular goiter. This could be further characterized with thyroid ultrasound. Trace free fluid in the pelvis. Electronically Signed   By: KRolm BaptiseM.D.   On: 08/31/2018 09:45   Mr Cervical Spine Wo Contrast  Result Date: 08/30/2018 CLINICAL DATA:  C7 fracture. Four weeks of neck pain radiating into both arms with numbness. EXAM: MRI CERVICAL SPINE WITHOUT CONTRAST TECHNIQUE: Multiplanar, multisequence MR imaging of the cervical spine was performed. No intravenous contrast was administered. COMPARISON:  Cervical spine radiographs 08/30/2018 and MRI 11/29/2011 FINDINGS: Alignment: Straightening/slight reversal of the normal cervical lordosis. Trace  retrolisthesis of C2 on C3. Vertebrae: Interval C3-4 ACDF since the 2013 MRI with solid interbody arthrodesis. Pathologic C7 compression fracture with 60% height loss centrally. Complete replacement of normal marrow signal throughout the C7 vertebral body extending into the posterior elements bilaterally. 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 Tumor tumor also narrows the C7 and C8 neural foramina bilaterally. No suspicious marrow lesions identified elsewhere in the cervical and included upper thoracic spine. Cord: Normal signal. Posterior Fossa, vertebral arteries, paraspinal tissues: Preserved vertebral artery flow voids. Partially visualized 6 cm right thyroid mass, new or much larger than in 2013. Disc levels: C2-3: Minimal disc bulging and uncovertebral  spurring without stenosis. C3-4: ACDF. Mild bilateral osseous neural foraminal narrowing. No spinal stenosis. C4-5: Disc bulging, uncovertebral spurring, and mild right and moderate left facet arthrosis have progressed from 2013 and result in moderate right and mild left neural foraminal stenosis and borderline spinal stenosis. C5-6: Broad-based posterior disc osteophyte complex results in borderline spinal stenosis and mild right and moderate to severe left neural foraminal stenosis, stable to slightly progressed from 2013. C6-7: Severe disc space narrowing. Moderate spinal stenosis due to epidural tumor. Moderate bilateral neural foraminal stenosis. C7-T1: Moderate to severe bilateral neural foraminal stenosis due to tumor. No significant spinal stenosis at the disc space level. IMPRESSION: 1. Pathologic C7 compression fracture with 60% height loss and epidural tumor resulting in moderate spinal stenosis and moderate to severe bilateral C7 and C8 neural foraminal stenosis. This could reflect multiple myeloma or metastatic disease. 2. Solid C3-4 ACDF without significant residual stenosis. 3. Progressive disc degeneration at C4-5 and C5-6 since 2013 with moderate to severe left neural foraminal stenosis at C5-6. Electronically Signed   By: Allen  Grady M.D.   On: 08/30/2018 21:56   Ct Abdomen Pelvis W Contrast  Result Date: 08/31/2018 CLINICAL DATA:  Pathologic cervical spine fracture. Assessing for primary malignancy EXAM: CT CHEST, ABDOMEN, AND PELVIS WITH CONTRAST TECHNIQUE: Multidetector CT imaging of the chest, abdomen and pelvis was performed following the standard protocol during bolus administration of intravenous contrast. CONTRAST:  100mL OMNIPAQUE IOHEXOL 300 MG/ML  SOLN COMPARISON:  MRI cervical spine 08/30/2018. FINDINGS: CT CHEST FINDINGS Cardiovascular: Heart is normal size. Scattered aortic arch calcifications. Aorta is normal caliber. No dissection. Mediastinum/Nodes: No mediastinal, hilar, or  axillary adenopathy. Enlarged, heterogeneous appearance of the right thyroid lobe, likely replaced by large mass measuring 4.6 x 4.1 cm on axial imaging. Small nodule in the left thyroid lobe. Lungs/Pleura: Numerous bilateral pulmonary nodules most compatible with metastases. Index lingular nodule measures 11 mm on image 74. Index left lower lobe nodule measures 7 mm on image 82. Index right lower lobe nodule measures 7 mm on image 77. Numerous other similarly sized nodules. No effusions. Musculoskeletal: Pathologic fracture again seen at the C7 level. No additional focal bone lesion. CT ABDOMEN PELVIS FINDINGS Hepatobiliary: No focal hepatic abnormality. Gallbladder unremarkable. Pancreas: No focal abnormality or ductal dilatation. Spleen: No focal abnormality.  Normal size. Adrenals/Urinary Tract: Large enhancing mass within the lower pole of the right kidney measures 6.5 x 5.3 cm most compatible with renal cell carcinoma. Small cysts in the left kidney. No hydronephrosis. Urinary bladder unremarkable. Stomach/Bowel: Stomach, large and small bowel grossly unremarkable. Vascular/Lymphatic: Aortic atherosclerosis. No enlarged abdominal or pelvic lymph nodes. Reproductive: Prior hysterectomy.  No adnexal masses. Other: Trace free fluid in the pelvis.  No free air. Musculoskeletal: Postoperative changes in the lower lumbar spine posterior fusion. Destructive lytic   lesion noted in the medial right iliac bone measuring up to 3.1 cm. Probable early lesion with early cortical scalloping noted in the left iliac bone, measuring 2.1 cm. Mixed lytic and sclerotic lesion noted more inferiorly in the left iliac bone adjacent to the SI joint. IMPRESSION: Large enhancing mixed solid and cystic mass within the mid to lower pole of the right kidney compatible with renal cell carcinoma. This measures up to 6.5 cm. Numerous pulmonary nodules within both lungs compatible with pulmonary metastases. Lytic destructive lesion within the  right medial iliac crest with probable early lesion in a similar position in the left iliac bone. Mixed lytic and sclerotic lesion more inferiorly within the left iliac bone. Findings compatible with bone metastases. Right thyroid nodule/mass measuring up to 4.6 cm. This may reflect multinodular goiter. This could be further characterized with thyroid ultrasound. Trace free fluid in the pelvis. Electronically Signed   By: Rolm Baptise M.D.   On: 08/31/2018 09:45   Dg Chest Port 1 View  Result Date: 08/31/2018 CLINICAL DATA:  77 year old female. Preop for pathologic fracture with tumor replacement of C7 and cord compression. Initial encounter. EXAM: PORTABLE CHEST 1 VIEW COMPARISON:  12/01/2011 chest x-ray. FINDINGS: No infiltrate, congestive heart failure or pneumothorax. Nodularity peripheral aspect right upper lung possibly nodule versus confluence of shadows. Prominence right hilar region may reflect tortuous aorta and central pulmonary arteries. Difficult to evaluate for hilar lesion. Biapical pleural thickening without associated bony destruction. Impression upon the right aspect of the trachea. On 08/30/2018 cervical spine MR, there is a large mass within the right thyroid gland which may contribute to this appearance. Heart top-normal size. Acromioclavicular joint degenerative changes. IMPRESSION: 1. No infiltrate, congestive heart failure or pneumothorax. 2. Nodularity peripheral aspect right upper lung possibly nodule versus confluence of shadows. Prominence right hilar region may reflect tortuous aorta and central pulmonary arteries. Difficult to evaluate for hilar lesion. These findings can be reassessed on close follow-up two-view chest or with chest CT. 3. Impression upon the right aspect of the trachea. On 08/30/2018 cervical spine MR, there is a large mass within the right thyroid gland which contributes to this appearance. These results will be called to the ordering clinician or representative  by the Radiologist Assistant, and communication documented in the PACS or zVision Dashboard. Electronically Signed   By: Genia Del M.D.   On: 08/31/2018 07:34   US Thyroid  Result Date: 09/01/2018 CLINICAL DATA:  Palpable abnormality.  Thyroid mass. EXAM: THYROID ULTRASOUND TECHNIQUE: Ultrasound examination of the thyroid gland and adjacent soft tissues was performed. COMPARISON:  None. FINDINGS: Parenchymal Echotexture: Markedly heterogenous Isthmus: 0.4 cm Right lobe: 7.4 x 3.5 x 4.8 cm Left lobe: 5.1 x 1.5 x 2.1 cm _________________________________________________________ Estimated total number of nodules >/= 1 cm: 2 Number of spongiform nodules >/=  2 cm not described below (TR1): 0 Number of mixed cystic and solid nodules >/= 1.5 cm not described below (TR2): 0 _________________________________________________________ Nodule # 1: Location: Right; Mid Maximum size: 5.3 cm; Other 2 dimensions: 4.4 x 3.6 cm Composition: solid/almost completely solid (2) Echogenicity: hypoechoic (2) Shape: not taller-than-wide (0) Margins: smooth (0) Echogenic foci: none (0) ACR TI-RADS total points: 4. ACR TI-RADS risk category: TR4 (4-6 points). ACR TI-RADS recommendations: **Given size (>/= 1.5 cm) and appearance, fine needle aspiration of this moderately suspicious nodule should be considered based on TI-RADS criteria. _________________________________________________________ Nodule # 2: Location: Left; Inferior Maximum size: 1.1 cm; Other 2 dimensions: 1.0 x 0.6 cm Composition:  solid/almost completely solid (2) Echogenicity: isoechoic (1) Shape: not taller-than-wide (0) Margins: smooth (0) Echogenic foci: none (0) ACR TI-RADS total points: 3. ACR TI-RADS risk category: TR3 (3 points). ACR TI-RADS recommendations: Given size (<1.4 cm) and appearance, this nodule does NOT meet TI-RADS criteria for biopsy or dedicated follow-up. _________________________________________________________ Other nodules measure 0.9 cm or less  in size and do not meet criteria for biopsy nor follow-up. IMPRESSION: Right nodule 1 meets criteria for fine needle aspiration biopsy. Other nodules do not meet criteria for biopsy nor follow-up. The above is in keeping with the ACR TI-RADS recommendations - J Am Coll Radiol 2017;14:587-595. Electronically Signed   By: Arthur  Hoss M.D.   On: 09/01/2018 10:37    Labs:  CBC: Recent Labs    08/30/18 2047 09/02/18 0300  WBC 6.2 5.1  HGB 11.3* 9.6*  HCT 33.2* 31.8*  PLT 256 260    COAGS: Recent Labs    08/31/18 0419  INR 1.12  APTT 29    BMP: Recent Labs    08/30/18 2047 08/31/18 0419 09/02/18 0300  NA 139 138 139  K 4.6 4.1 3.9  CL 104 106 110  CO2 25 23 22  GLUCOSE 121* 158* 157*  BUN 23 18 30*  CALCIUM 9.8 9.4 8.7*  CREATININE 1.07* 0.92 1.10*  GFRNONAA 48* 58* 46*  GFRAA 56* >60 54*    LIVER FUNCTION TESTS: Recent Labs    08/30/18 2047  BILITOT 0.4  AST 18  ALT 10  ALKPHOS 101  PROT 7.9  ALBUMIN 4.0    TUMOR MARKERS: No results for input(s): AFPTM, CEA, CA199, CHROMGRNA in the last 8760 hours.  Assessment and Plan:  Patient with neck/back pain >1 month presented to ED on 10/6 for same - found to have pathologic fracture of C7 as well as right renal mass, lytic lesion of right iliac crest and thyroid mass. Oncology is following who is concerned for metastatic renal cell carcinoma and is requesting biopsy of kidney mass or lytic lesion. Patient reviewed with Dr. Hoss today who agrees to proceed with biopsy of lytic right iliac crest bone lesion.  Patient has been NPO since last night, last dose of heparin SQ yesterday at 1300, she is afebrile, WBC 5.1, H/H 9.6/31.8, INR 1.12. She is agreeable to proceed with procedure today - she requests that her son Jeff and wife [name] be notified of the procedure once she returns to the floor.  Risks and benefits discussed with the patient including, but not limited to bleeding, infection, damage to adjacent  structures or low yield requiring additional tests.  All of the patient's questions were answered, patient is agreeable to proceed.  Consent signed and in chart.  Thank you for this interesting consult.  I greatly enjoyed meeting Jizel M Czaplicki and look forward to participating in their care.  A copy of this report was sent to the requesting provider on this date.  Electronically Signed: Shannon A [place], PA-C 09/02/2018, 1:27 PM   I spent a total of 40 Minutes  in face to face in clinical consultation, greater than 50% of which was counseling/coordinating care for lytic iliac crest lesion biopsy. 

## 2018-09-02 NOTE — Progress Notes (Signed)
NEUROSURGERY PROGRESS NOTE  Doing well. Complains of some mild neck pain. The collar bothers her more than anything right now.  No arm pain.  Good strength and sensation   Temp:  [97.8 F (36.6 C)-98.4 F (36.9 C)] 98 F (36.7 C) (10/09 0400) Pulse Rate:  [56-70] 56 (10/08 2000) Resp:  [15-32] 32 (10/08 2300) BP: (132-148)/(48-62) 148/56 (10/09 0400) SpO2:  [94 %-99 %] 94 % (10/08 2300)   Eileen Chiquito, NP 09/02/2018 8:40 AM

## 2018-09-02 NOTE — Progress Notes (Signed)
PROGRESS NOTE    Eileen Norris  PJS:[id number] DOB: May 06, 1941 DOA: 08/31/2018 PCP: [name], MD   Brief Narrative:  77 year old with past medical history relevant for hypertension, status post C3-4 ACDF, neuropathic pain admitted with worsening bilateral upper extremity numbness and was found to have pathologic fracture at C7 as well as severe bilateral foraminal stenosis as well as multiple bony metastases currently being worked up for primary source.   Assessment & Plan:   Principal Problem:   Pathologic fracture of cervical vertebra, initial encounter Active Problems:   HTN (hypertension)   Right renal mass   Pulmonary nodules   Bone metastases (HCC)   #) Pathologic fracture cervical vertebrae: Secondary to primary that is currently being worked up. -Neurosurgery following, they will have Rad/Onc evaluate the imaging and consider surgery pending their recommendations -Continue IV dexamethasone per neurosurgery, will defer to them  #) Metastatic cancer of unknown primary: Noted to have metastatic disease in multiple bones, lungs.  Possible sources include renal cystic mass as well as thyroid nodule.  Exline-SPEP UPEP pending for multiple myeloma evaluation - Ultrasound of thyroid on 09/01/2018 shows right nodule that meets criteria for biopsy -Pending renal biopsy by interventional radiology -Oncology following, appreciate recommendations -Radiation/oncology following, appreciate recommendations -PT recommends skilled nursing facility  #) Anemia: Patient noted to be more anemic than 3 days ago.  Has proximally 1.5 g drop.  Patient is on iron supplement at home. -Recheck tomorrow -Continue iron supplementation per home  #) Hypertension: -Continue lisinopril 20 mg daily -Continue HCTZ 25 mg daily -Continue metoprolol tartrate 50 mg twice daily  #) Pain/psych: -Continue gabapentin 200 mg 3 times daily  Fluids: Tolerating p.o. Electrodes: Monitor and supplement  nutrition: Regular diet  Prophylaxis:  Consultants:   Neurosurgery  Radiation/oncology  Oncology  Procedures:   None  Antimicrobials:   None   Subjective: Patient reports that her numbness and tingling is improving.  She denies any nausea, vomiting, diarrhea, cough, congestion.  Objective: Vitals:   09/01/18 2300 09/02/18 0400 09/02/18 0845 09/02/18 1145  BP: (!) 148/61 (!) 148/56 (!) 159/62 (!) 169/56  Pulse:   61 (!) 49  Resp: (!) 32  17 (!) 7  Temp: 98 F (36.7 C) 98 F (36.7 C) 98 F (36.7 C) 98 F (36.7 C)  TempSrc: Oral Oral Oral Oral  SpO2: 94%  97% 100%  Weight:      Height:        Intake/Output Summary (Last 24 hours) at 09/02/2018 1344 Last data filed at 09/02/2018 0800 Gross per 24 hour  Intake 945.97 ml  Output -  Net 945.97 ml   Filed Weights   08/31/18 0247  Weight: 84.6 kg    Examination:  General exam: Appears calm and comfortable  Respiratory system: Clear to auscultation. Respiratory effort normal. Cardiovascular system: Regular rate and rhythm, no murmurs Gastrointestinal system: Abdomen is nondistended, soft and nontender. No organomegaly or masses felt. Normal bowel sounds heard. Central nervous system: Alert and oriented.Bilateral upper extremity and lower extremity strength is 4+ out of 5, diminished sensation in bilateral upper extremity and lower extremity Extremities: 1+ lower extremity edema Skin: No visible skin Psychiatry: Judgement and insight appear normal. Mood & affect appropriate.     Data Reviewed: I have personally reviewed following labs and imaging studies  CBC: Recent Labs  Lab 08/30/18 2047 09/02/18 0300  WBC 6.2 5.1  NEUTROABS  --  4.0  HGB 11.3* 9.6*  HCT 33.2* 31.8*  MCV 90.3 94.1  PLT 256 196   Basic Metabolic Panel: Recent Labs  Lab 08/30/18 2047 08/31/18 0419 09/02/18 0300  NA 139 138 139  K 4.6 4.1 3.9  CL 104 106 110  CO2 _0 GLUCOSE 121* 158* 157*  BUN 23 18 30*  CREATININE  1.07* 0.92 1.10*  CALCIUM 9.8 9.4 8.7*   GFR: Estimated Creatinine Clearance: 41.8 mL/min (A) (by C-G formula based on SCr of 1.1 mg/dL (H)). Liver Function Tests: Recent Labs  Lab 08/30/18 2047  AST 18  ALT 10  ALKPHOS 101  BILITOT 0.4  PROT 7.9  ALBUMIN 4.0   No results for input(s): LIPASE, AMYLASE in the last 168 hours. No results for input(s): AMMONIA in the last 168 hours. Coagulation Profile: Recent Labs  Lab 08/31/18 0419  INR 1.12   Cardiac Enzymes: No results for input(s): CKTOTAL, CKMB, CKMBINDEX, TROPONINI in the last 168 hours. BNP (last 3 results) No results for input(s): PROBNP in the last 8760 hours. HbA1C: No results for input(s): HGBA1C in the last 72 hours. CBG: No results for input(s): GLUCAP in the last 168 hours. Lipid Profile: No results for input(s): CHOL, HDL, LDLCALC, TRIG, CHOLHDL, LDLDIRECT in the last 72 hours. Thyroid Function Tests: No results for input(s): TSH, T4TOTAL, FREET4, T3FREE, THYROIDAB in the last 72 hours. Anemia Panel: No results for input(s): VITAMINB12, FOLATE, FERRITIN, TIBC, IRON, RETICCTPCT in the last 72 hours. Sepsis Labs: No results for input(s): PROCALCITON, LATICACIDVEN in the last 168 hours.  Recent Results (from the past 240 hour(s))  Urine culture     Status: Abnormal   Collection Time: 08/30/18  8:47 PM  Result Value Ref Range Status   Specimen Description   Final    URINE, RANDOM Performed at [facility], [address] [place], [place] [address]    Special Requests   Final    NONE Performed at [facility], [address] [place], [place] [address]    Culture MULTIPLE SPECIES PRESENT, SUGGEST RECOLLECTION (A)  Final   Report Status 09/01/2018 FINAL  Final         Radiology Studies: US Thyroid  Result Date: 09/01/2018 CLINICAL DATA:  Palpable abnormality.  Thyroid mass. EXAM: THYROID ULTRASOUND TECHNIQUE: Ultrasound examination of the thyroid gland and adjacent soft  tissues was performed. COMPARISON:  None. FINDINGS: Parenchymal Echotexture: Markedly heterogenous Isthmus: 0.4 cm Right lobe: 7.4 x 3.5 x 4.8 cm Left lobe: 5.1 x 1.5 x 2.1 cm _________________________________________________________ Estimated total number of nodules >/= 1 cm: 2 Number of spongiform nodules >/=  2 cm not described below (TR1): 0 Number of mixed cystic and solid nodules >/= 1.5 cm not described below (TR2): 0 _________________________________________________________ Nodule # 1: Location: Right; Mid Maximum size: 5.3 cm; Other 2 dimensions: 4.4 x 3.6 cm Composition: solid/almost completely solid (2) Echogenicity: hypoechoic (2) Shape: not taller-than-wide (0) Margins: smooth (0) Echogenic foci: none (0) ACR TI-RADS total points: 4. ACR TI-RADS risk category: TR4 (4-6 points). ACR TI-RADS recommendations: **Given size (>/= 1.5 cm) and appearance, fine needle aspiration of this moderately suspicious nodule should be considered based on TI-RADS criteria. _________________________________________________________ Nodule # 2: Location: Left; Inferior Maximum size: 1.1 cm; Other 2 dimensions: 1.0 x 0.6 cm Composition: solid/almost completely solid (2) Echogenicity: isoechoic (1) Shape: not taller-than-wide (0) Margins: smooth (0) Echogenic foci: none (0) ACR TI-RADS total points: 3. ACR TI-RADS risk category: TR3 (3 points). ACR TI-RADS recommendations: Given size (<1.4 cm) and appearance, this nodule does NOT meet TI-RADS criteria  for biopsy or dedicated follow-up. _________________________________________________________ Other nodules measure 0.9 cm or less in size and do not meet criteria for biopsy nor follow-up. IMPRESSION: Right nodule 1 meets criteria for fine needle aspiration biopsy. Other nodules do not meet criteria for biopsy nor follow-up. The above is in keeping with the ACR TI-RADS recommendations - J Am Coll Radiol 2017;14:587-595. Electronically Signed   By: Marybelle Killings M.D.   On:  09/01/2018 10:37        Scheduled Meds: . dexamethasone  10 mg Intravenous Q6H  . gabapentin  100 mg Oral Q1200  . gabapentin  200 mg Oral BID  . lidocaine      . pantoprazole  40 mg Oral Q1200   Continuous Infusions: . sodium chloride 50 mL/hr at 09/02/18 0600  . cefTRIAXone (ROCEPHIN)  IV 1 g (09/02/18 0559)     LOS: 2 days    Time spent: [place], MD [facility]  If 7PM-7AM, please contact night-coverage [email or web address] Password [email or web address] 09/02/2018, 1:44 PM

## 2018-09-02 NOTE — Telephone Encounter (Signed)
Cld and spoke to the pt's daughter, Eileen Norris, and scheduled the pt to see Dr. Audelia Hives on 10/21 at 11am. [name] is aware to arrive 30 minutes early. Voiced understanding.

## 2018-09-02 NOTE — Telephone Encounter (Signed)
Cld and spoke to the pt's son Merry Proud to schedule a hospital follow up. Pt has been scheduled to see Dr. Alen Blew on 10/22 at 11am. Mr. Vanderloop is aware his mom should arrive 30 minutes early to be checked in on time. Letter mailed.

## 2018-09-03 ENCOUNTER — Inpatient Hospital Stay (HOSPITAL_COMMUNITY): Payer: Medicare Other

## 2018-09-03 ENCOUNTER — Telehealth: Payer: Self-pay | Admitting: Radiation Therapy

## 2018-09-03 ENCOUNTER — Encounter (HOSPITAL_COMMUNITY): Payer: Self-pay

## 2018-09-03 LAB — MAGNESIUM: Magnesium: 2.4 mg/dL (ref 1.7–2.4)

## 2018-09-03 LAB — CBC
HCT: 32.6 % — ABNORMAL LOW (ref 36.0–46.0)
Hemoglobin: 9.9 g/dL — ABNORMAL LOW (ref 12.0–15.0)
MCH: 28.7 pg (ref 26.0–34.0)
MCHC: 30.4 g/dL (ref 30.0–36.0)
MCV: 94.5 fL (ref 80.0–100.0)
Platelets: 262 K/uL (ref 150–400)
RBC: 3.45 MIL/uL — ABNORMAL LOW (ref 3.87–5.11)
RDW: 13.2 % (ref 11.5–15.5)
WBC: 4.8 K/uL (ref 4.0–10.5)
nRBC: 0 % (ref 0.0–0.2)

## 2018-09-03 LAB — BASIC METABOLIC PANEL
Anion gap: 7 (ref 5–15)
CO2: 22 mmol/L (ref 22–32)
Creatinine, Ser: 1.08 mg/dL — ABNORMAL HIGH (ref 0.44–1.00)
GFR calc non Af Amer: 48 mL/min — ABNORMAL LOW (ref >60)
Potassium: 3.8 mmol/L (ref 3.5–5.1)

## 2018-09-03 LAB — BASIC METABOLIC PANEL WITH GFR
BUN: 37 mg/dL — ABNORMAL HIGH (ref 8–23)
Calcium: 8.9 mg/dL (ref 8.9–10.3)
Chloride: 112 mmol/L — ABNORMAL HIGH (ref 98–111)
GFR calc Af Amer: 55 mL/min — ABNORMAL LOW (ref >60)
Glucose, Bld: 157 mg/dL — ABNORMAL HIGH (ref 70–99)
Sodium: 141 mmol/L (ref 135–145)

## 2018-09-03 MED ORDER — GADOBUTROL 1 MMOL/ML IV SOLN
8.0000 mL | Freq: Once | INTRAVENOUS | Status: AC | PRN
  Administered 2018-09-03: 8 mL via INTRAVENOUS

## 2018-09-03 MED ORDER — DEXAMETHASONE 4 MG PO TABS
4.0000 mg | ORAL_TABLET | ORAL | Status: DC
  Administered 2018-09-03 – 2018-09-04 (×7): 4 mg via ORAL
  Filled 2018-09-03 (×7): qty 1

## 2018-09-03 NOTE — Evaluation (Signed)
Physical Therapy Evaluation Patient Details Name: Eileen Norris MRN: [id number] DOB: [birth year] Today's Date: 09/03/2018   History of Present Illness  PT is a 77 yo female admitted after experiencing more than one month of worsening BUE weakness/numbness and pain.  Pt found to have a pathalogic fx of C7 and severe B formainal stenosis at C7-C8.  Appears pt has a renal mass with metasteses to lungs, thyroid and bone (iliac crest).  Pt with h/o lumbar surgery in 2017 and C3-C4 ACDF in 2013.  Pt also with h/o HTN.  Clinical Impression  Patient presents with decreased independence with mobility due to weakness in UE's and pain as well as imbalance.  She has recent history of fall with self recovery and is unsteady with cane.  Discussed using walker at home as well as well as having someone there with her at all times.  Feel she will benefit from skilled PT in the acute setting to allow return home with family support and follow up HHPT.   Follow Up Recommendations Home health PT;Supervision/Assistance - 24 hour    Equipment Recommendations  None recommended by PT    Recommendations for Other Services       Precautions / Restrictions Precautions Precautions: Fall;Cervical Required Braces or Orthoses: Cervical Brace Cervical Brace: Hard collar;At all times;Other (comment)      Mobility  Bed Mobility Overal bed mobility: Needs Assistance Bed Mobility: Sit to Supine       Sit to supine: Supervision   General bed mobility comments: cues for sit to side, but sat straight back, then scooted up in bed  Transfers Overall transfer level: Needs assistance Equipment used: Straight cane Transfers: Sit to/from Stand Sit to Stand: Supervision         General transfer comment: stood from 3:1 beside bed  Ambulation/Gait Ambulation/Gait assistance: Min guard Gait Distance (Feet): 120 Feet Assistive device: Straight cane Gait Pattern/deviations: Step-through pattern;Shuffle;Decreased  stride length     General Gait Details: wobbly on her feet with cane, minguard for safety throughout, one overt LOB with minguard to recover with turning to speak in hallway  Stairs            Wheelchair Mobility    Modified Rankin (Stroke Patients Only)       Balance Overall balance assessment: Needs assistance;History of Falls Sitting-balance support: Feet supported Sitting balance-Leahy Scale: Good     Standing balance support: Single extremity supported Standing balance-Leahy Scale: Fair                               Pertinent Vitals/Pain Pain Assessment: Faces Faces Pain Scale: Hurts even more Pain Location: R shoulder/arm Pain Descriptors / Indicators: Aching;Sore Pain Intervention(s): Monitored during session;Repositioned    Home Living Family/patient expects to be discharged to:: Private residence Living Arrangements: Alone Available Help at Discharge: Available PRN/intermittently;Family;Friend(s) Type of Home: House Home Access: Stairs to enter Entrance Stairs-Rails: None Entrance Stairs-Number of Steps: 2 Home Layout: One level Home Equipment: Walker - 2 wheels;Cane - single point;Bedside commode Additional Comments: Pt uses cane at all times.  Had one fall about one month ago.  Fell straight backward and got herself back up.    Prior Function Level of Independence: Independent with assistive device(s)         Comments: Pt sponge bathes unless someone is there with her.  Pt does not drive. Uses cane at all times.     Hand Dominance  Dominant Hand: Right    Extremity/Trunk Assessment   Upper Extremity Assessment Upper Extremity Assessment: Defer to OT evaluation    Lower Extremity Assessment Lower Extremity Assessment: Generalized weakness    Cervical / Trunk Assessment Cervical / Trunk Assessment: Other exceptions Cervical / Trunk Exceptions: ACDF in 2013, lumbar surgery in 2017.  Pathalogic fx to C7.  Communication    Communication: No difficulties  Cognition Arousal/Alertness: Awake/alert Behavior During Therapy: WFL for tasks assessed/performed Overall Cognitive Status: Within Functional Limits for tasks assessed                                        General Comments      Exercises     Assessment/Plan    PT Assessment Patient needs continued PT services  PT Problem List Decreased strength;Decreased balance;Decreased mobility;Decreased knowledge of precautions;Decreased safety awareness;Decreased knowledge of use of DME       PT Treatment Interventions DME instruction;Balance training;Gait training;Stair training;Functional mobility training;Therapeutic exercise    PT Goals (Current goals can be found in the Care Plan section)  Acute Rehab PT Goals Patient Stated Goal: to get back home and take care of myself. PT Goal Formulation: With patient Time For Goal Achievement: 09/10/18 Potential to Achieve Goals: Good    Frequency Min 5X/week   Barriers to discharge        Co-evaluation               AM-PAC PT "6 Clicks" Daily Activity  Outcome Measure Difficulty turning over in bed (including adjusting bedclothes, sheets and blankets)?: Unable Difficulty moving from lying on back to sitting on the side of the bed? : Unable Difficulty sitting down on and standing up from a chair with arms (e.g., wheelchair, bedside commode, etc,.)?: Unable Help needed moving to and from a bed to chair (including a wheelchair)?: A Little Help needed walking in hospital room?: A Little Help needed climbing 3-5 steps with a railing? : A Lot 6 Click Score: 11    End of Session Equipment Utilized During Treatment: Gait belt;Cervical collar Activity Tolerance: Patient tolerated treatment well Patient left: in bed;with call bell/phone within reach;with bed alarm set   PT Visit Diagnosis: Unsteadiness on feet (R26.81);Other abnormalities of gait and mobility (R26.89);History of falling  (Z91.81)    Time: 5537-4827 PT Time Calculation (min) (ACUTE ONLY): 16 min   Charges:   PT Evaluation $PT Eval Moderate Complexity: [place], [place] Acute Rehabilitation Services [phone] 09/03/2018   Reginia Naas 09/03/2018, 12:51 PM

## 2018-09-03 NOTE — Progress Notes (Signed)
Subjective: She is sitting up in the chair, immobilized in Aspen cervical collar.  In discussing case and reviewing studies with Dr. Saintclair Halsted since patient's admission on October 7.  Patient has been found to have widely metastatic cancer, with apparent renal primary with numerous pulmonary metastases and bony metastases.  Also has been found to have a large thyroid mass.  She has had some discomfort running through the right upper extremity consistent with cervical radiculopathy.  Has been continuing on dexamethasone 10 mg IV every 6 hours.  Objective: Vital signs in last 24 hours: Vitals:   09/02/18 2258 09/03/18 0006 09/03/18 0400 09/03/18 0734  BP: (!) 132/55 (!) 143/59 (!) 162/55 (!) 160/67  Pulse: (!) 55   (!) 50  Resp:  11  15  Temp:  97.9 F (36.6 C) 98.3 F (36.8 C) 97.7 F (36.5 C)  TempSrc:  Oral Oral Oral  SpO2:    99%  Weight:      Height:        Intake/Output from previous day: 10/09 0701 - 10/10 0700 In: 1413.6 [P.O.:240; I.V.:1073.6; IV Piggyback:100] Out: -  Intake/Output this shift: No intake/output data recorded.  Physical Exam: Awake and alert, following commands.  Weakness of the upper extremities bilaterally: Deltoid and biceps are 5 bilaterally, left triceps is 3-4 minus, right triceps is 5, left intrinsics are 4-, right intrinsics are 4-4+, left grip is 3-4 minus, right grip is 4.  CBC Recent Labs    09/02/18 0300 09/03/18 0320  WBC 5.1 4.8  HGB 9.6* 9.9*  HCT 31.8* 32.6*  PLT 260 262   BMET Recent Labs    09/02/18 0300 09/03/18 0320  NA 139 141  K 3.9 3.8  CL 110 112*  CO2 22 22  GLUCOSE 157* 157*  BUN 30* 37*  CREATININE 1.10* 1.08*  CALCIUM 8.7* 8.9    Assessment/Plan: Spoke with patient and subsequently with her daughter-in-law, Shamonique Battiste [phone]), regarding her condition.  I have explained to them that this is a very serious condition with widespread metastatic cancer, with an apparent primary in the kidney with numerous  pulmonary metastases and bony metastases.  I have explained that Dr. Saintclair Halsted and I both feel that the metastasis to C7 is not operable due to the severe bleeding typically encountered with metastatic renal carcinoma to the bone.  We both favor radiation therapy, deferring the judgment regarding SRS versus conventional radiation therapy to Dr. Ledon Snare.  Certainly the most important intervention is going to be her systemic therapy which will be directed by Dr. Alen Blew.  For now I feel that she needs to continue to be immobilized in the Aspen cervical collar indefinitely.  Both Dr. Saintclair Halsted and I are available to assist with the [facility], if that treatment is elected.  The patient's and her daughter-in-law's questions were answered for them.  I also discussed my assessment and recommendations with her nurse, Jarrett Soho, today.  I have adjusted her dexamethasone therapy from 10 mg IV every 6 hours to 4 mg p.o. every 4 hours.  Hosie Spangle, MD 09/03/2018, 10:43 AM

## 2018-09-03 NOTE — Telephone Encounter (Signed)
Spoke with son Merry Proud, and daughter-in law, [name], about Kiaria's upcoming planning appointments. They expressed understanding and are happy with this plan.   Cervical Spine Planning MRI should be done prior to discharge, 10/10 or 10/11 Simulation Friday 10/11 @ 3:00 Treatment Monday 10/21 @ 12:30   Mont Dutton R.T.(R)(T) Special Procedures Navigator  [phone]

## 2018-09-03 NOTE — Progress Notes (Signed)
PROGRESS NOTE    Eileen Norris  UXL:[id number] DOB: 03/01/1941 DOA: 08/31/2018 PCP: [name], MD   Brief Narrative:  77 year old with past medical history relevant for hypertension, status post C3-4 ACDF, neuropathic pain admitted with worsening bilateral upper extremity numbness and was found to have pathologic fracture at C7 as well as severe bilateral foraminal stenosis as well as multiple bony metastases currently being worked up for primary source.   Assessment & Plan:   Principal Problem:   Pathologic fracture of cervical vertebra, initial encounter Active Problems:   HTN (hypertension)   Right renal mass   Pulmonary nodules   Bone metastases (HCC)   #) Pathologic fracture cervical vertebrae: Secondary to primary that is currently being worked up.  Her neurological symptoms primarily the upper extremity numbness and tingling her improving -Neurosurgery following, at this time to the concern that this could be renal cell carcinoma neurosurgery does not want to operate due to concern for bleeding, they do recommend outpatient follow-up and continue to use aspen collar - Radiation oncology has been consulted and is pending -Neurosurgery is transition the patient to 4 mg dexamethasone every 4 hours  #) Metastatic cancer of unknown primary: Noted to have metastatic disease in multiple bones, lungs.  Possible sources include renal cystic mass as well as thyroid nodule.   -SPEP from 08/31/2018- - Ultrasound of thyroid on 09/01/2018 shows right nodule that meets criteria for biopsy -Status post right iliac bone biopsy on 09/02/2018 -Oncology following, appreciate recommendations -Radiation/oncology following, appreciate recommendations -PT recommends skilled nursing facility  #) Anemia: Stable -Continue iron supplementation per home  #) Hypertension: -Continue lisinopril 20 mg daily -Continue HCTZ 25 mg daily -Continue metoprolol tartrate 50 mg twice daily  #)  Pain/psych: -Continue gabapentin 200 mg 3 times daily  Fluids: Tolerating p.o. Electrodes: Monitor and supplement  nutrition: Regular diet  Prophylaxis: SCDs  Disposition: Pending skilled nursing facility evaluation  Full code  Consultants:   Neurosurgery  Radiation/oncology  Oncology  Interventional radiology  Procedures:   09/02/2018 right iliac bone biopsy  Antimicrobials:   None   Subjective: Patient reports that her numbness and tingling is improving.  She does not report any pain in her biopsy site or any bleeding or discharge.  She denies any nausea, vomiting, diarrhea, cough, congestion, rhinorrhea.  Objective: Vitals:   09/02/18 2258 09/03/18 0006 09/03/18 0400 09/03/18 0734  BP: (!) 132/55 (!) 143/59 (!) 162/55 (!) 160/67  Pulse: (!) 55   (!) 50  Resp:  11  15  Temp:  97.9 F (36.6 C) 98.3 F (36.8 C) 97.7 F (36.5 C)  TempSrc:  Oral Oral Oral  SpO2:    99%  Weight:      Height:        Intake/Output Summary (Last 24 hours) at 09/03/2018 1136 Last data filed at 09/02/2018 1600 Gross per 24 hour  Intake 574.74 ml  Output -  Net 574.74 ml   Filed Weights   08/31/18 0247  Weight: 84.6 kg    Examination:  General exam: Appears calm and comfortable  Respiratory system: Clear to auscultation. Respiratory effort normal. Cardiovascular system: Regular rate and rhythm, no murmurs Gastrointestinal system: Abdomen is nondistended, soft and nontender. No organomegaly or masses felt. Normal bowel sounds heard. Central nervous system: Alert and oriented.Bilateral upper extremity and lower extremity strength is 4+ out of 5, diminished sensation in bilateral upper extremity and lower extremity Extremities: 1+ lower extremity edema Skin: No visible skin Psychiatry: Judgement and insight appear  normal. Mood & affect appropriate.     Data Reviewed: I have personally reviewed following labs and imaging studies  CBC: Recent Labs  Lab 08/30/18 2047  09-09-2018 0300 09/03/18 0320  WBC 6.2 5.1 4.8  NEUTROABS  --  4.0  --   HGB 11.3* 9.6* 9.9*  HCT 33.2* 31.8* 32.6*  MCV 90.3 94.1 94.5  PLT 256 260 122   Basic Metabolic Panel: Recent Labs  Lab 08/30/18 2047 08/31/18 0419 Sep 09, 2018 0300 09/03/18 0320  NA 139 138 139 141  K 4.6 4.1 3.9 3.8  CL 104 106 110 112*  CO2 25 23 22 22   GLUCOSE 121* 158* 157* 157*  BUN 23 18 30* 37*  CREATININE 1.07* 0.92 1.10* 1.08*  CALCIUM 9.8 9.4 8.7* 8.9  MG  --   --   --  2.4   GFR: Estimated Creatinine Clearance: 42.6 mL/min (A) (by C-G formula based on SCr of 1.08 mg/dL (H)). Liver Function Tests: Recent Labs  Lab 08/30/18 2047  AST 18  ALT 10  ALKPHOS 101  BILITOT 0.4  PROT 7.9  ALBUMIN 4.0   No results for input(s): LIPASE, AMYLASE in the last 168 hours. No results for input(s): AMMONIA in the last 168 hours. Coagulation Profile: Recent Labs  Lab 08/31/18 0419  INR 1.12   Cardiac Enzymes: No results for input(s): CKTOTAL, CKMB, CKMBINDEX, TROPONINI in the last 168 hours. BNP (last 3 results) No results for input(s): PROBNP in the last 8760 hours. HbA1C: No results for input(s): HGBA1C in the last 72 hours. CBG: No results for input(s): GLUCAP in the last 168 hours. Lipid Profile: No results for input(s): CHOL, HDL, LDLCALC, TRIG, CHOLHDL, LDLDIRECT in the last 72 hours. Thyroid Function Tests: No results for input(s): TSH, T4TOTAL, FREET4, T3FREE, THYROIDAB in the last 72 hours. Anemia Panel: No results for input(s): VITAMINB12, FOLATE, FERRITIN, TIBC, IRON, RETICCTPCT in the last 72 hours. Sepsis Labs: No results for input(s): PROCALCITON, LATICACIDVEN in the last 168 hours.  Recent Results (from the past 240 hour(s))  Urine culture     Status: Abnormal   Collection Time: 08/30/18  8:47 PM  Result Value Ref Range Status   Specimen Description   Final    URINE, RANDOM Performed at [facility], [address], [place], [place] [address]    Special  Requests   Final    NONE Performed at [facility], [address] [place], [place] [address]    Culture MULTIPLE SPECIES PRESENT, SUGGEST RECOLLECTION (A)  Final   Report Status 09/01/2018 FINAL  Final         Radiology Studies: Ct Biopsy  Result Date: 09-09-18 INDICATION: Renal mass.  Bone lesions. EXAM: CT BIOPSY MEDICATIONS: None. ANESTHESIA/SEDATION: Fentanyl 50 mcg IV; Versed 1 mg IV Moderate Sedation Time:  10 The patient was continuously monitored during the procedure by the interventional radiology nurse under my direct supervision. FLUOROSCOPY TIME:  Fluoroscopy Time:  minutes  seconds ( mGy). COMPLICATIONS: None immediate. PROCEDURE: Informed written consent was obtained from the patient after a thorough discussion of the procedural risks, benefits and alternatives. All questions were addressed. Maximal Sterile Barrier Technique was utilized including caps, mask, sterile gowns, sterile gloves, sterile drape, hand hygiene and skin antiseptic. A timeout was performed prior to the initiation of the procedure. Under CT guidance, a(n) 17 gauge guide needle was advanced into the right iliac bone lesion. Subsequently, 3 18 gauge core biopsies were obtained. The guide needle was removed. Post biopsy images  demonstrate no hemorrhage. Patient tolerated the procedure well without complication. Vital sign monitoring by nursing staff during the procedure will continue as patient is in the special procedures unit for post procedure observation. FINDINGS: The images document guide needle placement within the right iliac bone lesion. Post biopsy images demonstrate no hemorrhage. IMPRESSION: Successful CT-guided core biopsy of a lytic right iliac bone lesion. Electronically Signed   By: Marybelle Killings M.D.   On: 09/02/2018 14:54        Scheduled Meds: . cholecalciferol  1,000 Units Oral Daily  . dexamethasone  4 mg Oral Q4H  . ferrous sulfate  325 mg Oral Q breakfast  . gabapentin  100  mg Oral Q1200  . gabapentin  200 mg Oral BID  . hydrochlorothiazide  25 mg Oral Daily  . lisinopril  20 mg Oral Daily  . metoprolol tartrate  50 mg Oral BID  . pantoprazole  40 mg Oral Q1200   Continuous Infusions:    LOS: 3 days    Time spent: [place], MD [facility]  If 7PM-7AM, please contact night-coverage [email or web address] Password [email or web address] 09/03/2018, 11:36 AM

## 2018-09-03 NOTE — Evaluation (Signed)
Occupational Therapy Evaluation Patient Details Name: Eileen Norris MRN: [id number] DOB: July 03, 1941 Today's Date: 09/03/2018    History of Present Illness PT is a 77 yo female admitted after experiencing more than one month of worsening BUE weakness/numbness and pain.  Pt found to have a pathalogic fx of C7 and severe B formainal stenosis at C7-C8.  Appears pt has a renal mass with metasteses to lungs, thyroid and bone (iliac crest).  Pt with h/o lumbar surgery in 2017 and C3-C4 ACDF in 2013.  Pt also with h/o HTN.   Clinical Impression   Pt admitted with the above diagnosis and has the deficits listed below. Pt would benefit from cont OT to increase independence with adls and adl transfers as well as address energy conservation techniques.  Pt currently lives alone and is min guard with most adls. Feel with future radiation and chemo planned, pt will need some someone with her at all times.  Pt has had a fall in the last month and was able to get up by herself.  Recommend HHOT.  Pt seems to think she can have 24/7 assist available if needed.    Follow Up Recommendations  Home health OT;Supervision/Assistance - 24 hour    Equipment Recommendations  None recommended by OT    Recommendations for Other Services       Precautions / Restrictions Precautions Precautions: Fall;Cervical Required Braces or Orthoses: Cervical Brace Cervical Brace: Hard collar;At all times;Other (comment)(all times except when in bed and showering per chart) Restrictions Weight Bearing Restrictions: No      Mobility Bed Mobility Overal bed mobility: Needs Assistance Bed Mobility: Rolling;Sidelying to Sit Rolling: Supervision Sidelying to sit: Supervision       General bed mobility comments: Cues for technique to not stress her neck.  Transfers Overall transfer level: Needs assistance Equipment used: Straight cane Transfers: Sit to/from [facility] Sit to Stand: Supervision Stand  pivot transfers: Min guard       General transfer comment: cues for  hand placement.    Balance Overall balance assessment: Needs assistance;History of Falls Sitting-balance support: Feet supported Sitting balance-Leahy Scale: Good     Standing balance support: Single extremity supported Standing balance-Leahy Scale: Fair Standing balance comment: Pt able to let go of support to manage clothes etc.  Depends on support to ambulate.                           ADL either performed or assessed with clinical judgement   ADL Overall ADL's : Needs assistance/impaired Eating/Feeding: Independent;Sitting Eating/Feeding Details (indicate cue type and reason): Pt sat in chair to eat. Pt was able to open all containers. Grooming: Wash/dry hands;Wash/dry face;Oral care;Supervision/safety;Standing Grooming Details (indicate cue type and reason): Pt stood at sink for 5 minutes to groom with supervision. Upper Body Bathing: Set up;Sitting Upper Body Bathing Details (indicate cue type and reason): Pt sponge bathes at home unless family close by. Lower Body Bathing: Minimal assistance;Sit to/from stand;Cueing for compensatory techniques Lower Body Bathing Details (indicate cue type and reason): Pt required min assist to fully donn sock due to pain in R shoulder. Upper Body Dressing : Minimal assistance;Sitting;Cueing for compensatory techniques Upper Body Dressing Details (indicate cue type and reason): Pt requires assist to donn brace.  Educated pt in only unfastening one side of the brace to make it easier to donn. Lower Body Dressing: Minimal assistance;Sit to/from stand;Cueing for compensatory techniques Lower Body Dressing Details (indicate  cue type and reason): Min assist for socks. Toilet Transfer: Min guard;Ambulation;Comfort height toilet;Grab bars(used cane) Toilet Transfer Details (indicate cue type and reason): Pt walked to bathroom with CG assist and transferred on and off  commode with CGassist. Toileting- Clothing Manipulation and Hygiene: Supervision/safety;Sit to/from stand;Cueing for safety Toileting - Clothing Manipulation Details (indicate cue type and reason): Pt able to clean self and manage clothing with supervision.   Tub/Shower Transfer Details (indicate cue type and reason): Pt sponge bathes. Functional mobility during ADLs: Min guard;Cane General ADL Comments: Pt doing well with adls at this point but unsure how things will go moving foward.  Pt lives alone but has put her name in for an independent living apartment two months ago.  Pt did take a fall one month ago.  Feel she may need 24/7 S very soon if not now.       Vision Baseline Vision/History: No visual deficits Patient Visual Report: No change from baseline Vision Assessment?: No apparent visual deficits     Perception Perception Perception Tested?: No   Praxis Praxis Praxis tested?: Not tested    Pertinent Vitals/Pain Pain Assessment: Faces Faces Pain Scale: Hurts even more Pain Location: R shoulder/arm Pain Descriptors / Indicators: Aching;Sore Pain Intervention(s): Limited activity within patient's tolerance;Monitored during session;Premedicated before session;Repositioned     Hand Dominance Right   Extremity/Trunk Assessment Upper Extremity Assessment Upper Extremity Assessment: RUE deficits/detail;LUE deficits/detail RUE Deficits / Details: ROM WFL. Strength:  Shoulder 4/5, biceps 4+/5, triceps 3+/5, grip 4/5 RUE: Unable to fully assess due to pain RUE Coordination: WNL LUE Deficits / Details: ROM WFL  Strength:  Shoulder 4-/5, biceps 4+/5, triceps 3+/5, grip 4/5   Lower Extremity Assessment Lower Extremity Assessment: Defer to PT evaluation   Cervical / Trunk Assessment Cervical / Trunk Assessment: Other exceptions Cervical / Trunk Exceptions: ACDF in 2013, lumbar surgery in 2017.  Pathalogic fx to C7.   Communication Communication Communication: No  difficulties   Cognition Arousal/Alertness: Awake/alert Behavior During Therapy: WFL for tasks assessed/performed Overall Cognitive Status: Within Functional Limits for tasks assessed                                     General Comments  Pt functioning at a min guard level at this point in time. Pt lives alone but states someone may be able to stay with her.  She has been living alone and has gotten by to this point with one fall one month ago.  Do have concerns about her being alone and feel she is close to needing 24/7 assist.    Exercises     Shoulder Instructions      Home Living Family/patient expects to be discharged to:: Private residence Living Arrangements: Alone Available Help at Discharge: Available PRN/intermittently;Family;Friend(s) Type of Home: House Home Access: Stairs to enter [employer] of Steps: 2 Entrance Stairs-Rails: None Home Layout: One level     Bathroom Shower/Tub: Tub/shower unit;Door   Bathroom Toilet: Handicapped height     Home Equipment: Environmental consultant - 2 wheels;Cane - single point;Bedside commode   Additional Comments: Pt uses cane at all times.  Had one fall about one month ago.  Fell straight backward and got herself back up.      Prior Functioning/Environment Level of Independence: Independent with assistive device(s)        Comments: Pt sponge bathes unless someone is there with her.  Pt does  not drive. Uses cane at all times.        OT Problem List: Decreased strength;Impaired balance (sitting and/or standing);Decreased knowledge of use of DME or AE;Decreased knowledge of precautions;Impaired UE functional use;Pain      OT Treatment/Interventions: Self-care/ADL training;DME and/or AE instruction;Therapeutic activities    OT Goals(Current goals can be found in the care plan section) Acute Rehab OT Goals Patient Stated Goal: to get back home and take care of myself. OT Goal Formulation: With patient Time For  Goal Achievement: 09/17/18 Potential to Achieve Goals: Fair ADL Goals Pt Will Perform Lower Body Bathing: with supervision;sit to/from stand Pt Will Perform Lower Body Dressing: with supervision;sit to/from stand Additional ADL Goal #1: Pt will donn and doff neck brace Ily. Additional ADL Goal #2: Pt will walk to toilet with cane and complete all toileting with supervision. Additional ADL Goal #3: Pt will gather all clothes with cane and dress self with supervision.  OT Frequency: Min 2X/week   Barriers to D/C: Decreased caregiver support  Pt lives alone.  Feel she will need assistance with chemo and radiation being in her future.        Co-evaluation              AM-PAC PT "6 Clicks" Daily Activity     Outcome Measure Help from another person eating meals?: None Help from another person taking care of personal grooming?: None Help from another person toileting, which includes using toliet, bedpan, or urinal?: A Little Help from another person bathing (including washing, rinsing, drying)?: A Little Help from another person to put on and taking off regular upper body clothing?: A Little Help from another person to put on and taking off regular lower body clothing?: A Little 6 Click Score: 20   End of Session Equipment Utilized During Treatment: Cervical collar Nurse Communication: Mobility status  Activity Tolerance: Patient tolerated treatment well Patient left: in chair;with call bell/phone within reach  OT Visit Diagnosis: Unsteadiness on feet (R26.81);History of falling (Z91.81)                Time: 9458-5929 OT Time Calculation (min): 32 min Charges:  OT General Charges $OT Visit: 1 Visit OT Evaluation $OT Eval Moderate Complexity: 1 Mod OT Treatments $Self Care/Home Management : 8-22 mins  [facility] Deanglo Hissong,OTR/L [phone]  Glenford Peers 09/03/2018, 10:43 AM

## 2018-09-03 NOTE — Social Work (Signed)
CSW acknowledging consult for SNF placement. Will follow for physical therapy recommendations. Per OT recommendations pt recommended for HHOT.    [place], [place] Work [phone]

## 2018-09-03 NOTE — Care Management Important Message (Signed)
Important Message  Patient Details  Name: Eileen Norris MRN: [id number] Date of Birth: 11/30/1940   Medicare Important Message Given:  Yes    Orbie Pyo 09/03/2018, 4:37 PM

## 2018-09-03 NOTE — Progress Notes (Addendum)
Eugenio Saenz to MRI regarding STAT MRI ordered; per staff several anesthesia cases ahead at this time, looking at a few hours at least.  1425 called MRI for update regarding pts STAT MRI order, still with several cases ahead of pt at this time.    1645 spoke with MRI staff again several cases ahead of pt still at this time.   29 MRI called saying transport is en route.

## 2018-09-04 ENCOUNTER — Ambulatory Visit
Admission: RE | Admit: 2018-09-04 | Discharge: 2018-09-04 | Disposition: A | Discharge status: Home / Self Care | Payer: Medicare Other | Source: Ambulatory Visit | Attending: Radiation Oncology | Admitting: Radiation Oncology

## 2018-09-04 DIAGNOSIS — C7951 Secondary malignant neoplasm of bone: Secondary | ICD-10-CM

## 2018-09-04 LAB — BASIC METABOLIC PANEL WITH GFR
Anion gap: 8 (ref 5–15)
CO2: 25 mmol/L (ref 22–32)
Chloride: 106 mmol/L (ref 98–111)
GFR calc Af Amer: 52 mL/min — ABNORMAL LOW (ref >60)
Potassium: 4 mmol/L (ref 3.5–5.1)

## 2018-09-04 LAB — CBC
HCT: 32.7 % — ABNORMAL LOW (ref 36.0–46.0)
Hemoglobin: 10 g/dL — ABNORMAL LOW (ref 12.0–15.0)
MCH: 28.3 pg (ref 26.0–34.0)
MCHC: 30.6 g/dL (ref 30.0–36.0)
MCV: 92.6 fL (ref 80.0–100.0)
Platelets: 262 10*3/uL (ref 150–400)
RBC: 3.53 MIL/uL — ABNORMAL LOW (ref 3.87–5.11)
RDW: 13.2 % (ref 11.5–15.5)
WBC: 6.2 10*3/uL (ref 4.0–10.5)
nRBC: 0 % (ref 0.0–0.2)

## 2018-09-04 LAB — BASIC METABOLIC PANEL
BUN: 41 mg/dL — ABNORMAL HIGH (ref 8–23)
Calcium: 8.9 mg/dL (ref 8.9–10.3)
Creatinine, Ser: 1.13 mg/dL — ABNORMAL HIGH (ref 0.44–1.00)
GFR calc non Af Amer: 45 mL/min — ABNORMAL LOW (ref >60)
Glucose, Bld: 127 mg/dL — ABNORMAL HIGH (ref 70–99)
Sodium: 139 mmol/L (ref 135–145)

## 2018-09-04 MED ORDER — SODIUM CHLORIDE 0.9 % IV BOLUS
1000.0000 mL | Freq: Once | INTRAVENOUS | Status: AC
  Administered 2018-09-04: 1000 mL via INTRAVENOUS

## 2018-09-04 MED ORDER — DEXAMETHASONE 4 MG PO TABS: 8.0000 mg | ORAL_TABLET | Freq: Two times a day (BID) | ORAL | 0 refills | Status: AC

## 2018-09-04 MED ORDER — AMLODIPINE BESYLATE 10 MG PO TABS: 10.0000 mg | ORAL_TABLET | Freq: Every day | ORAL | 1 refills | Status: AC

## 2018-09-04 MED ORDER — AMLODIPINE BESYLATE 10 MG PO TABS
10.0000 mg | ORAL_TABLET | Freq: Every day | ORAL | Status: DC
  Administered 2018-09-04: 10 mg via ORAL
  Filled 2018-09-04: qty 1

## 2018-09-04 NOTE — Progress Notes (Signed)
  Radiation Oncology         [phone] ________________________________  Name: [name] MRN: [id number]  Date: 09/04/2018  DOB: 22-Apr-1941  STEREOTACTIC BODY RADIOTHERAPY SIMULATION AND TREATMENT PLANNING NOTE    ICD-10-CM   1. Bone metastases ([place]) C79.51     DIAGNOSIS:  77 yo woman with C7 metastasis from renal cell carcinoma  NARRATIVE:  The patient was brought to the [facility].  Identity was confirmed.  All relevant records and images related to the planned course of therapy were reviewed.  The patient freely provided informed written consent to proceed with treatment after reviewing the details related to the planned course of therapy. The consent form was witnessed and verified by the simulation staff.  Then, the patient was set-up in a stable reproducible  supine position for radiation therapy.  A BodyFix immobilization pillow was fabricated for reproducible positioning.  Surface markings were placed.  The CT images were loaded into the planning software.  The gross target volumes (GTV) and planning target volumes (PTV) were delinieated, and avoidance structures were contoured.  Treatment planning then occurred.  The radiation prescription was entered and confirmed.  A total of two complex treatment devices were fabricated in the form of the BodyFix immobilization pillow and a neck accuform cushion.  I have requested : 3D Simulation  I have requested a DVH of the following structures: targets and all normal structures near the target including pharynx, brachial plexus as noted on the radiation plan to maintain doses in adherence with established limits  PLAN:  The patient will receive 18 Gy in 1 fraction.  ________________________________  Sheral Apley Tammi Klippel, M.D.

## 2018-09-04 NOTE — Discharge Instructions (Signed)
Cervical Collar A cervical collar is a device that supports your chin and the back of your head. It is used after a severe neck injury to protect your head and neck. It does this by restricting the movement of the top part of your spine, which is located in your neck. A cervical collar may be used when you have:  A fractured neck.  Ligament damage.  A spinal cord injury.  What instructions should I follow?  Wear the collar for as long as your health care provider instructs.  Follow your health care provider's instructions about how to put on and take off your collar.  Do not make your collar so tight that you feel pain or it is hard for you to breathe.  Do not remove the collar unless your health care provider says it is okay. Ask your health care provider if you can remove the collar for showering or eating or to apply ice.  Do not drive a car until your health care provider says it is okay.  Keep all follow-up visits as directed by your health care provider. This is important. Any delay in getting necessary care can keep your injury from healing properly.  Apply ice to the injured area: ? Put ice in a plastic bag. ? Place a towel between your skin and the bag. ? Leave the ice on for 20 minutes, 2-3 times per day for the first 2 days. This information is not intended to replace advice given to you by your health care provider. Make sure you discuss any questions you have with your health care provider. Document Released: 08/03/2004 Document Revised: 03/21/2016 Document Reviewed: 06/20/2014 Elsevier Interactive Patient Education  [employer].

## 2018-09-04 NOTE — Progress Notes (Signed)
Subjective: Patient continues to have right cervical radicular discomfort.  Underwent 3T SRS protocol MRI of cervical spine for SRS planning.  Scheduled for simulation at radiation oncology department at [facility] this afternoon at 3 PM.  SRS scheduled for October 21, as an outpatient.  Pathology from thyroid biopsy and right iliac biopsy remain pending.  To follow-up with both Dr. Tammi Klippel (radiation oncology) and Dr. Alen Blew (medical oncology) as an outpatient at [facility].  Objective: Vital signs in last 24 hours: Vitals:   09/04/18 0000 09/04/18 0100 09/04/18 0200 09/04/18 0400  BP:    (!) 148/58  Pulse:      Resp: 13 13 14 13   Temp:    97.6 F (36.4 C)  TempSrc:    Oral  SpO2:      Weight:      Height:        Intake/Output from previous day: 10/10 0701 - 10/11 0700 In: 720 [P.O.:720] Out: 650 [Urine:650] Intake/Output this shift: No intake/output data recorded.  Physical Exam: Remains immobilized in [place] cervical collar.  Weakness in distal upper extremities without change.  CBC Recent Labs    09/03/18 0320 09/04/18 0410  WBC 4.8 6.2  HGB 9.9* 10.0*  HCT 32.6* 32.7*  PLT 262 262   BMET Recent Labs    09/03/18 0320 09/04/18 0410  NA 141 139  K 3.8 4.0  CL 112* 106  CO2 22 25  GLUCOSE 157* 127*  BUN 37* 41*  CREATININE 1.08* 1.13*  CALCIUM 8.9 8.9    Studies/Results: Mr Cervical Spine W Wo Contrast  Result Date: 09/03/2018 CLINICAL DATA:  Initial evaluation for osseous metastases, SRS protocol for treatment planning. EXAM: MRI CERVICAL SPINE WITHOUT AND WITH CONTRAST TECHNIQUE: Multiplanar and multiecho pulse sequences of the cervical spine, to include the craniocervical junction and cervicothoracic junction, were obtained without and with intravenous contrast. CONTRAST:  80 cc of Gadavist. COMPARISON:  Recent MRI from 08/30/2018. FINDINGS: Alignment: Straightening with slight reversal of the normal cervical lordosis, stable from previous. Trace  retrolisthesis of C2 on C3. Vertebrae: Prior ACDF at C3-4 with solid interbody arthrodesis. Pathologic C7 compression fracture with approximate 60% height loss again seen, stable from previous. Abnormal signal intensity with enhancement seen throughout the C7 vertebral body, consistent with tumor. Extension into the posterior elements bilaterally, left greater than right. Probable early extra osseous extension with tumor in the ventral epidural space, as well as extending into the bilateral C6-7 and C7-T1 neural foramina. Overall, appearance stable from previous. Vertebral body heights otherwise maintained without evidence for acute or chronic fracture. No other osseous metastases identified. Cord: Small focus of chronic myelomalacia of noted at the level of C3-4. Signal intensity within the cervical spinal cord otherwise normal. Posterior Fossa, vertebral arteries, paraspinal tissues: Visualized brain and posterior fossa within normal limits. Craniocervical junction normal. Paraspinous and prevertebral soft tissues demonstrate no acute finding. Normal intravascular flow voids preserved within the vertebral arteries bilaterally. Large right thyroid nodule/mass again seen, stable from previous. Few scattered pulmonary nodules noted within the visualized lung apices. Disc levels: C2-C3: Trace retrolisthesis. Mild diffuse disc bulge with uncovertebral spurring. No significant stenosis. C3-C4:  Prior ACDF.  No residual spinal stenosis. C4-C5: Disc bulge with uncovertebral spurring. Bilateral facet arthrosis with associated neural foraminal narrowing, better evaluated on prior exam. Stable borderline mild spinal stenosis. C5-C6: Broad disc osteophyte complex with resultant mild spinal stenosis. Moderate to severe left with mild right C6 foraminal narrowing, stable. C6-C7: Severe disc space narrowing related  to adjacent pathologic fracture. Moderate spinal stenosis with moderate to severe bilateral C7 foraminal narrowing,  also unchanged. C7-T1: Fairly severe bilateral C8 foraminal stenosis related to adjacent pathologic fracture and epidural tumor. Central canal remains patent. Visualized upper thoracic spine demonstrates no significant finding. IMPRESSION: 1. Pathologic C7 compression fracture with associated 60% height loss with moderate spinal stenosis, stable from previous. 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 Study performed for [facility] treatment planning purposes. 2. No other osseous metastases identified on this postcontrast examination. 3. Small focus of chronic myelomalacia in at C3-4. 4. Prior ACDF at C3-4 with additional multilevel degenerative spondylolysis, stable from previous. Electronically Signed   By: Jeannine Boga M.D.   On: 09/03/2018 21:23   Ct Biopsy  Result Date: 09/02/2018 INDICATION: Renal mass.  Bone lesions. EXAM: CT BIOPSY MEDICATIONS: None. ANESTHESIA/SEDATION: Fentanyl 50 mcg IV; Versed 1 mg IV Moderate Sedation Time:  10 The patient was continuously monitored during the procedure by the interventional radiology nurse under my direct supervision. FLUOROSCOPY TIME:  Fluoroscopy Time:  minutes  seconds ( mGy). COMPLICATIONS: None immediate. PROCEDURE: Informed written consent was obtained from the patient after a thorough discussion of the procedural risks, benefits and alternatives. All questions were addressed. Maximal Sterile Barrier Technique was utilized including caps, mask, sterile gowns, sterile gloves, sterile drape, hand hygiene and skin antiseptic. A timeout was performed prior to the initiation of the procedure. Under CT guidance, a(n) 17 gauge guide needle was advanced into the right iliac bone lesion. Subsequently, 3 18 gauge core biopsies were obtained. The guide needle was removed. Post biopsy images demonstrate no hemorrhage. Patient tolerated the procedure well without complication. Vital sign  monitoring by nursing staff during the procedure will continue as patient is in the special procedures unit for post procedure observation. FINDINGS: The images document guide needle placement within the right iliac bone lesion. Post biopsy images demonstrate no hemorrhage. IMPRESSION: Successful CT-guided core biopsy of a lytic right iliac bone lesion. Electronically Signed   By: Marybelle Killings M.D.   On: 09/02/2018 14:54    Assessment/Plan: For outpatient SRS (to be coordinated by radiation oncology department) and medical oncologic (to be coordinated by Dr. Hazeline Junker office) treatment.  Should be discharged on dexamethasone 4 mg p.o. every 4 hours.   Hosie Spangle, MD 09/04/2018, 7:49 AM

## 2018-09-04 NOTE — Discharge Summary (Signed)
Physician Discharge Summary  Eileen Norris:[id number] DOB: 24-Nov-1941 DOA: 08/31/2018  PCP: [name], MD  Admit date: 08/31/2018 Discharge date: 09/04/2018  Admitted From: Home Disposition:  Home  Recommendations for Outpatient Follow-up:  1. Follow up with PCP in 1-2 weeks 2. Please obtain BMP/CBC in one week 3. Please check your blood pressure at least twice a week. 4. Please keep your cervical collar on until you are seen by the neurosurgeons 5. Please follow-up as an outpatient with the radiation oncologist and your oncologist. 6. Please take your steroids until you are seen by the neurosurgeons or the oncologists 7. Please follow-up with your primary care doctor for a biopsy of a thyroid nodule found during CT scan 8. Please follow-up with oncology about your biopsy results  Home Health: yES, HHPT Equipment/Devices: none  Discharge Condition: stable CODE STATUS:FULL Diet recommendation: Heart Healthy  Brief/Interim Summary:  #) Pathologic fracture cervical vertebrae: Patient was seen at [facility] with recalcitrant shoulder, neck pain as well as paresthesias of the upper extremities and some weakness and was noted to have impingement of her cervical spine due to metastatic cancer of unknown primary.  She was transferred to [facility] for neurosurgical evaluation.  Patient was seen by neurosurgery and started on initially IV steroids.  Patient's symptoms improved including improving strength and diminished neuropathic symptoms.  Due to concern that this was likely a renal cell carcinoma neurosurgery was concerned about bleeding and so opted not to do surgery but instead decided to defer to radiation/oncology..  Patient was placed in [place] collar to be maintained until she is seen by neurosurgery as an outpatient.  She was discharged on oral steroids as well as home health physical therapy and outpatient follow-up with neurosurgery.  She will have outpatient radiation oncology  treatments as well.  #) Metastatic cancer of unknown primary: Patient was noted to have metastatic disease in multiple bones, lungs as well as a suspicious thyroid nodule and a large renal cystic mass.  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  Interventional radiology was consulted and performed biopsy of right iliac lesion on 09/02/2018 from which pathology is pending.  SPEP was unremarkable.  Ultrasound of thyroid on 8 09/01/2018 showed a right thyroid nodule that may criteria for biopsy.  Oncology was following and recommended evaluation by radiation oncology.  Radiation oncology was consulted and recommended mapping of the patient at [facility] immediately after discharge.  Patient was seen by physical therapy who initially recommended skilled nursing but then this patient progressed to change the patient home health PT with 24-hour supervision.  Patient will follow-up as an outpatient with oncology for the pathology of this biopsy and additional treatment.  #) AKI: Patient developed mild AKI during hospitalization.  Patient's lisinopril was held and she was given a gentle bolus.  #) Anemia: Patient was noted to be anemic here.  This was thought to be multifactorial including delusional, iron deficiency, bone marrow infiltration.  Her hemoglobin was stable.  #) Hypertension: Patient was continued on home lisinopril, HCTZ, metoprolol.  When patient developed AKI lisinopril was discontinued.  Patient was started on amlodipine on discharge for hypertension management.  Patient was told to follow-up her blood pressures as an outpatient.  #) Pain/psych: Patient was continued gabapentin 200 mg 3 times daily   Discharge Diagnoses:  Principal Problem:   Pathologic fracture of cervical vertebra, initial encounter Active Problems:   HTN (hypertension)   Right renal mass  Pulmonary nodules   Bone metastases [facility])    Discharge  Instructions  Discharge Instructions    Call MD for:  difficulty breathing, headache or visual disturbances   Complete by:  As directed    Call MD for:  extreme fatigue   Complete by:  As directed    Call MD for:  hives   Complete by:  As directed    Call MD for:  persistant dizziness or light-headedness   Complete by:  As directed    Call MD for:  persistant nausea and vomiting   Complete by:  As directed    Call MD for:  redness, tenderness, or signs of infection (pain, swelling, redness, odor or green/yellow discharge around incision site)   Complete by:  As directed    Call MD for:  severe uncontrolled pain   Complete by:  As directed    Call MD for:  temperature >100.4   Complete by:  As directed    Diet - low sodium heart healthy   Complete by:  As directed    Discharge instructions   Complete by:  As directed    Please follow-up with your primary doctor in 1 week.  Please follow-up with the neurosurgeons as an outpatient.  Please keep the collar on until you see the neurosurgeons.   Increase activity slowly   Complete by:  As directed      Allergies as of 09/04/2018      Reactions   Advil [ibuprofen] Other (See Comments)   "Gives me the shakes"   Prilosec [omeprazole] Other (See Comments)   Constipation      Medication List    STOP taking these medications   aspirin EC 81 MG tablet   lisinopril 20 MG tablet Commonly known as:  PRINIVIL,ZESTRIL     TAKE these medications   amLODipine 10 MG tablet Commonly known as:  NORVASC Take 1 tablet (10 mg total) by mouth daily.   dexamethasone 4 MG tablet Commonly known as:  DECADRON Take 2 tablets (8 mg total) by mouth 2 (two) times daily.   docusate sodium 100 MG capsule Commonly known as:  COLACE Take 100 mg by mouth daily as needed for mild constipation.   ferrous sulfate 325 (65 FE) MG EC tablet Take 325 mg by mouth daily with breakfast.   gabapentin 100 MG capsule Commonly known as:  NEURONTIN Take 100  mg by mouth See admin instructions. Taking 2 capsules (244m) in the AM, then 1 capsule (1054m at noon, then 2 capsules (20091mat bedtime.   hydrochlorothiazide 50 MG tablet Commonly known as:  HYDRODIURIL Take 50 mg by mouth daily.   meloxicam 7.5 MG tablet Commonly known as:  MOBIC Take 7.5 mg by mouth 2 times daily at 12 noon and 4 pm.   metoprolol tartrate 50 MG tablet Commonly known as:  LOPRESSOR Take 50 mg by mouth 2 (two) times daily.   Vitamin D-3 1000 units Caps Take 1,000 Units by mouth daily.       Allergies  Allergen Reactions  . Advil [Ibuprofen] Other (See Comments)    "Gives me the shakes"  . Prilosec [Omeprazole] Other (See Comments)    Constipation    Consultations:  Neurosurgery  Radiation/oncology  Oncology  Interventional radiology   Procedures/Studies: Dg Cervical Spine 2-3 Views  Result Date: 08/30/2018 CLINICAL DATA:  Neck pain, prior fusion in 2013 EXAM: CERVICAL SPINE - 2-3 VIEW COMPARISON:  02/25/2012 FINDINGS: Prevertebral soft tissues normal thickness. Bones demineralized.  Prior anterior fusion of C3-C4 with plate/screws and incorporated bone plugs. Superior endplate compression fracture of C7, new since 2013, with approximately 40% anterior height loss. Disc space narrowing with endplate spur formation at C5-C6 and C6-C7. Bulky anterior spurs at C2-C3 new since previous study. Scattered mild facet degenerative changes. No additional fracture or subluxation. IMPRESSION: Prior anterior fusion of C3-C4. New superior endplate compression fracture of C7 since 2013 with approximately 40% anterior height loss. Scattered degenerative disc and facet disease changes of the cervical spine. Electronically Signed   By: Lavonia Dana M.D.   On: 08/30/2018 18:50   Dg Lumbar Spine 2-3 Views  Result Date: 08/30/2018 CLINICAL DATA:  Back pain, fell 2 weeks ago, lumbar surgery 2017 EXAM: LUMBAR SPINE - 2-3 VIEW COMPARISON:  02/27/2018 FINDINGS: 5  non-rib-bearing lumbar vertebra. Bones demineralized. BILATERAL pedicle screws and posterior bars L3-L5 with disc prosthesis at L4-L5. Hardware appears intact. Diffuse disc space narrowing. No fracture, subluxation or bone destruction. SI joints preserved. IMPRESSION: Postsurgical changes of posterior fusion L3-L5. Osseous demineralization with degenerative disc disease changes lumbar spine. No acute abnormalities. Electronically Signed   By: Lavonia Dana M.D.   On: 08/30/2018 18:46   Dg Shoulder Right  Result Date: 08/30/2018 CLINICAL DATA:  RIGHT shoulder pain EXAM: RIGHT SHOULDER - 2+ VIEW COMPARISON:  None FINDINGS: Osseous demineralization. Degenerative changes at [facility] joint with joint space narrowing and spur formation. No glenohumeral fracture or dislocation. Calcified bodies identified adjacent to proximal RIGHT humeral metadiaphysis. Visualized RIGHT ribs intact. IMPRESSION: Osseous demineralization with degenerative changes RIGHT AC joint. No acute abnormalities. Questionable calcified loose bodies adjacent to the proximal humerus. Electronically Signed   By: Lavonia Dana M.D.   On: 08/30/2018 18:43   Ct Soft Tissue Neck W Contrast  Result Date: 08/31/2018 CLINICAL DATA:  Mediastinal mass, substernal thyroid suspected. Pathologic fracture of the cervical spine. EXAM: CT NECK WITH CONTRAST TECHNIQUE: Multidetector CT imaging of the neck was performed using the standard protocol following the bolus administration of intravenous contrast. CONTRAST:  176m OMNIPAQUE IOHEXOL 300 MG/ML  SOLN COMPARISON:  MRI of the cervical spine 08/30/2018. CT of the cervical spine 11/28/2011. FINDINGS: Pharynx and larynx: Asymmetric right-sided soft tissue is present at the palatine tonsils. Calcifications are present. This likely represents chronic inflammation. No definite mass lesion is present. No focal mucosal or submucosal lesion is present. Vocal cords are midline and symmetric. Salivary glands: The submandibular  and parotid glands are normal. Thyroid: Asymmetric heterogeneous nodule is present within the right lobe of the thyroid. The nodule measures 3.8 x 5.4 x 3.9 cm. The right-sided nodule has increased significantly since 2013. There smaller nodules in the left lobe of the thyroid. Lymph nodes: No significant cervical adenopathy is present. Vascular: Atherosclerotic changes are present at the aortic arch. No significant vascular lesions are present in the neck. Limited intracranial: Within normal limits. Visualized orbits: Visualized globes and orbits are within normal limits. Mastoids and visualized paranasal sinuses: The paranasal sinuses and mastoid air cells are clear. Skeleton: The pathologic fracture at C7 is again noted. No other focal lytic lesions are present. Anterior fusion is present at C3-4. Upper chest: Multiple bilateral lung nodules are present. The largest right upper lobe nodule measures 7.5 mm. Two 7 mm left upper lobe nodules are present. IMPRESSION: 1. Lytic pathologic compression fracture at T7 with associated central canal stenosis. 2. Multiple bilateral pulmonary nodules consistent with metastatic disease. 3. No other osseous lesions. 4. Marked increase in size of heterogeneous right thyroid  nodule measuring 3.8 x 5.4 x 3.9 cm. While this may represent malignancy, it remains in capsulated. The lesion is amenable to ultrasound-guided biopsy. Other sources for primary tumor should be evaluated as well. CT of the chest, abdomen, and pelvis is being performed concurrently. Electronically Signed   By: San Morelle M.D.   On: 08/31/2018 11:16   Ct Chest W Contrast  Result Date: 08/31/2018 CLINICAL DATA:  Pathologic cervical spine fracture. Assessing for primary malignancy EXAM: CT CHEST, ABDOMEN, AND PELVIS WITH CONTRAST TECHNIQUE: Multidetector CT imaging of the chest, abdomen and pelvis was performed following the standard protocol during bolus administration of intravenous contrast.  CONTRAST:  128m OMNIPAQUE IOHEXOL 300 MG/ML  SOLN COMPARISON:  MRI cervical spine 08/30/2018. FINDINGS: CT CHEST FINDINGS Cardiovascular: Heart is normal size. Scattered aortic arch calcifications. Aorta is normal caliber. No dissection. Mediastinum/Nodes: No mediastinal, hilar, or axillary adenopathy. Enlarged, heterogeneous appearance of the right thyroid lobe, likely replaced by large mass measuring 4.6 x 4.1 cm on axial imaging. Small nodule in the left thyroid lobe. Lungs/Pleura: Numerous bilateral pulmonary nodules most compatible with metastases. Index lingular nodule measures 11 mm on image 74. Index left lower lobe nodule measures 7 mm on image 82. Index right lower lobe nodule measures 7 mm on image 77. Numerous other similarly sized nodules. No effusions. Musculoskeletal: Pathologic fracture again seen at the C7 level. No additional focal bone lesion. CT ABDOMEN PELVIS FINDINGS Hepatobiliary: No focal hepatic abnormality. Gallbladder unremarkable. Pancreas: No focal abnormality or ductal dilatation. Spleen: No focal abnormality.  Normal size. Adrenals/Urinary Tract: Large enhancing mass within the lower pole of the right kidney measures 6.5 x 5.3 cm most compatible with renal cell carcinoma. Small cysts in the left kidney. No hydronephrosis. Urinary bladder unremarkable. Stomach/Bowel: Stomach, large and small bowel grossly unremarkable. Vascular/Lymphatic: Aortic atherosclerosis. No enlarged abdominal or pelvic lymph nodes. Reproductive: Prior hysterectomy.  No adnexal masses. Other: Trace free fluid in the pelvis.  No free air. Musculoskeletal: Postoperative changes in the lower lumbar spine posterior fusion. Destructive lytic lesion noted in the medial right iliac bone measuring up to 3.1 cm. Probable early lesion with early cortical scalloping noted in the left iliac bone, measuring 2.1 cm. Mixed lytic and sclerotic lesion noted more inferiorly in the left iliac bone adjacent to the SI joint.  IMPRESSION: Large enhancing mixed solid and cystic mass within the mid to lower pole of the right kidney compatible with renal cell carcinoma. This measures up to 6.5 cm. Numerous pulmonary nodules within both lungs compatible with pulmonary metastases. Lytic destructive lesion within the right medial iliac crest with probable early lesion in a similar position in the left iliac bone. Mixed lytic and sclerotic lesion more inferiorly within the left iliac bone. Findings compatible with bone metastases. Right thyroid nodule/mass measuring up to 4.6 cm. This may reflect multinodular goiter. This could be further characterized with thyroid ultrasound. Trace free fluid in the pelvis. Electronically Signed   By: KRolm BaptiseM.D.   On: 08/31/2018 09:45   Mr Cervical Spine Wo Contrast  Result Date: 08/30/2018 CLINICAL DATA:  C7 fracture. Four weeks of neck pain radiating into both arms with numbness. EXAM: MRI CERVICAL SPINE WITHOUT CONTRAST TECHNIQUE: Multiplanar, multisequence MR imaging of the cervical spine was performed. No intravenous contrast was administered. COMPARISON:  Cervical spine radiographs 08/30/2018 and MRI 11/29/2011 FINDINGS: Alignment: Straightening/slight reversal of the normal cervical lordosis. Trace retrolisthesis of C2 on C3. Vertebrae: Interval C3-4 ACDF since the 2013 MRI with solid interbody  arthrodesis. Pathologic C7 compression fracture with 60% height loss centrally. Complete replacement of normal marrow signal throughout the C7 vertebral body extending into the posterior elements bilaterally. 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 Tumor tumor also narrows the C7 and C8 neural foramina bilaterally. No suspicious marrow lesions identified elsewhere in the cervical and included upper thoracic spine. Cord: Normal signal. Posterior Fossa, vertebral arteries, paraspinal tissues: Preserved vertebral artery flow voids. Partially visualized  6 cm right thyroid mass, new or much larger than in 2013. Disc levels: C2-3: Minimal disc bulging and uncovertebral spurring without stenosis. C3-4: ACDF. Mild bilateral osseous neural foraminal narrowing. No spinal stenosis. C4-5: Disc bulging, uncovertebral spurring, and mild right and moderate left facet arthrosis have progressed from 2013 and result in moderate right and mild left neural foraminal stenosis and borderline spinal stenosis. C5-6: Broad-based posterior disc osteophyte complex results in borderline spinal stenosis and mild right and moderate to severe left neural foraminal stenosis, stable to slightly progressed from 2013. C6-7: Severe disc space narrowing. Moderate spinal stenosis due to epidural tumor. Moderate bilateral neural foraminal stenosis. C7-T1: Moderate to severe bilateral neural foraminal stenosis due to tumor. No significant spinal stenosis at the disc space level. IMPRESSION: 1. Pathologic C7 compression fracture with 60% height loss and epidural tumor resulting in moderate spinal stenosis and moderate to severe bilateral C7 and C8 neural foraminal stenosis. This could reflect multiple myeloma or metastatic disease. 2. Solid C3-4 ACDF without significant residual stenosis. 3. Progressive disc degeneration at C4-5 and C5-6 since 2013 with moderate to severe left neural foraminal stenosis at C5-6. Electronically Signed   By: Logan Bores M.D.   On: 08/30/2018 21:56   Mr Cervical Spine W Wo Contrast  Result Date: 09/03/2018 CLINICAL DATA:  Initial evaluation for osseous metastases, SRS protocol for treatment planning. EXAM: MRI CERVICAL SPINE WITHOUT AND WITH CONTRAST TECHNIQUE: Multiplanar and multiecho pulse sequences of the cervical spine, to include the craniocervical junction and cervicothoracic junction, were obtained without and with intravenous contrast. CONTRAST:  80 cc of Gadavist. COMPARISON:  Recent MRI from 08/30/2018. FINDINGS: Alignment: Straightening with slight  reversal of the normal cervical lordosis, stable from previous. Trace retrolisthesis of C2 on C3. Vertebrae: Prior ACDF at C3-4 with solid interbody arthrodesis. Pathologic C7 compression fracture with approximate 60% height loss again seen, stable from previous. Abnormal signal intensity with enhancement seen throughout the C7 vertebral body, consistent with tumor. Extension into the posterior elements bilaterally, left greater than right. Probable early extra osseous extension with tumor in the ventral epidural space, as well as extending into the bilateral C6-7 and C7-T1 neural foramina. Overall, appearance stable from previous. Vertebral body heights otherwise maintained without evidence for acute or chronic fracture. No other osseous metastases identified. Cord: Small focus of chronic myelomalacia of noted at the level of C3-4. Signal intensity within the cervical spinal cord otherwise normal. Posterior Fossa, vertebral arteries, paraspinal tissues: Visualized brain and posterior fossa within normal limits. Craniocervical junction normal. Paraspinous and prevertebral soft tissues demonstrate no acute finding. Normal intravascular flow voids preserved within the vertebral arteries bilaterally. Large right thyroid nodule/mass again seen, stable from previous. Few scattered pulmonary nodules noted within the visualized lung apices. Disc levels: C2-C3: Trace retrolisthesis. Mild diffuse disc bulge with uncovertebral spurring. No significant stenosis. C3-C4:  Prior ACDF.  No residual spinal stenosis. C4-C5: Disc bulge with uncovertebral spurring. Bilateral facet arthrosis with associated neural foraminal narrowing, better evaluated on prior exam. Stable borderline mild  spinal stenosis. C5-C6: Broad disc osteophyte complex with resultant mild spinal stenosis. Moderate to severe left with mild right C6 foraminal narrowing, stable. C6-C7: Severe disc space narrowing related to adjacent pathologic fracture. Moderate  spinal stenosis with moderate to severe bilateral C7 foraminal narrowing, also unchanged. C7-T1: Fairly severe bilateral C8 foraminal stenosis related to adjacent pathologic fracture and epidural tumor. Central canal remains patent. Visualized upper thoracic spine demonstrates no significant finding. IMPRESSION: 1. Pathologic C7 compression fracture with associated 60% height loss with moderate spinal stenosis, stable from previous. 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 Study performed for [place] treatment planning purposes. 2. No other osseous metastases identified on this postcontrast examination. 3. Small focus of chronic myelomalacia in at C3-4. 4. Prior ACDF at C3-4 with additional multilevel degenerative spondylolysis, stable from previous. Electronically Signed   By: Jeannine Boga M.D.   On: 09/03/2018 21:23   Ct Abdomen Pelvis W Contrast  Result Date: 08/31/2018 CLINICAL DATA:  Pathologic cervical spine fracture. Assessing for primary malignancy EXAM: CT CHEST, ABDOMEN, AND PELVIS WITH CONTRAST TECHNIQUE: Multidetector CT imaging of the chest, abdomen and pelvis was performed following the standard protocol during bolus administration of intravenous contrast. CONTRAST:  151m OMNIPAQUE IOHEXOL 300 MG/ML  SOLN COMPARISON:  MRI cervical spine 08/30/2018. FINDINGS: CT CHEST FINDINGS Cardiovascular: Heart is normal size. Scattered aortic arch calcifications. Aorta is normal caliber. No dissection. Mediastinum/Nodes: No mediastinal, hilar, or axillary adenopathy. Enlarged, heterogeneous appearance of the right thyroid lobe, likely replaced by large mass measuring 4.6 x 4.1 cm on axial imaging. Small nodule in the left thyroid lobe. Lungs/Pleura: Numerous bilateral pulmonary nodules most compatible with metastases. Index lingular nodule measures 11 mm on image 74. Index left lower lobe nodule measures 7 mm on image 82. Index  right lower lobe nodule measures 7 mm on image 77. Numerous other similarly sized nodules. No effusions. Musculoskeletal: Pathologic fracture again seen at the C7 level. No additional focal bone lesion. CT ABDOMEN PELVIS FINDINGS Hepatobiliary: No focal hepatic abnormality. Gallbladder unremarkable. Pancreas: No focal abnormality or ductal dilatation. Spleen: No focal abnormality.  Normal size. Adrenals/Urinary Tract: Large enhancing mass within the lower pole of the right kidney measures 6.5 x 5.3 cm most compatible with renal cell carcinoma. Small cysts in the left kidney. No hydronephrosis. Urinary bladder unremarkable. Stomach/Bowel: Stomach, large and small bowel grossly unremarkable. Vascular/Lymphatic: Aortic atherosclerosis. No enlarged abdominal or pelvic lymph nodes. Reproductive: Prior hysterectomy.  No adnexal masses. Other: Trace free fluid in the pelvis.  No free air. Musculoskeletal: Postoperative changes in the lower lumbar spine posterior fusion. Destructive lytic lesion noted in the medial right iliac bone measuring up to 3.1 cm. Probable early lesion with early cortical scalloping noted in the left iliac bone, measuring 2.1 cm. Mixed lytic and sclerotic lesion noted more inferiorly in the left iliac bone adjacent to the SI joint. IMPRESSION: Large enhancing mixed solid and cystic mass within the mid to lower pole of the right kidney compatible with renal cell carcinoma. This measures up to 6.5 cm. Numerous pulmonary nodules within both lungs compatible with pulmonary metastases. Lytic destructive lesion within the right medial iliac crest with probable early lesion in a similar position in the left iliac bone. Mixed lytic and sclerotic lesion more inferiorly within the left iliac bone. Findings compatible with bone metastases. Right thyroid nodule/mass measuring up to 4.6 cm. This may reflect multinodular goiter. This could be further characterized with thyroid ultrasound. Trace free  fluid in the  pelvis. Electronically Signed   By: Rolm Baptise M.D.   On: 08/31/2018 09:45   Ct Biopsy  Result Date: 09/02/2018 INDICATION: Renal mass.  Bone lesions. EXAM: CT BIOPSY MEDICATIONS: None. ANESTHESIA/SEDATION: Fentanyl 50 mcg IV; Versed 1 mg IV Moderate Sedation Time:  10 The patient was continuously monitored during the procedure by the interventional radiology nurse under my direct supervision. FLUOROSCOPY TIME:  Fluoroscopy Time:  minutes  seconds ( mGy). COMPLICATIONS: None immediate. PROCEDURE: Informed written consent was obtained from the patient after a thorough discussion of the procedural risks, benefits and alternatives. All questions were addressed. Maximal Sterile Barrier Technique was utilized including caps, mask, sterile gowns, sterile gloves, sterile drape, hand hygiene and skin antiseptic. A timeout was performed prior to the initiation of the procedure. Under CT guidance, a(n) 17 gauge guide needle was advanced into the right iliac bone lesion. Subsequently, 3 18 gauge core biopsies were obtained. The guide needle was removed. Post biopsy images demonstrate no hemorrhage. Patient tolerated the procedure well without complication. Vital sign monitoring by nursing staff during the procedure will continue as patient is in the special procedures unit for post procedure observation. FINDINGS: The images document guide needle placement within the right iliac bone lesion. Post biopsy images demonstrate no hemorrhage. IMPRESSION: Successful CT-guided core biopsy of a lytic right iliac bone lesion. Electronically Signed   By: Marybelle Killings M.D.   On: 09/02/2018 14:54   Dg Chest Port 1 View  Result Date: 08/31/2018 CLINICAL DATA:  77 year old female. Preop for pathologic fracture with tumor replacement of C7 and cord compression. Initial encounter. EXAM: PORTABLE CHEST 1 VIEW COMPARISON:  12/01/2011 chest x-ray. FINDINGS: No infiltrate, congestive heart failure or pneumothorax. Nodularity peripheral  aspect right upper lung possibly nodule versus confluence of shadows. Prominence right hilar region may reflect tortuous aorta and central pulmonary arteries. Difficult to evaluate for hilar lesion. Biapical pleural thickening without associated bony destruction. Impression upon the right aspect of the trachea. On 08/30/2018 cervical spine MR, there is a large mass within the right thyroid gland which may contribute to this appearance. Heart top-normal size. Acromioclavicular joint degenerative changes. IMPRESSION: 1. No infiltrate, congestive heart failure or pneumothorax. 2. Nodularity peripheral aspect right upper lung possibly nodule versus confluence of shadows. Prominence right hilar region may reflect tortuous aorta and central pulmonary arteries. Difficult to evaluate for hilar lesion. These findings can be reassessed on close follow-up two-view chest or with chest CT. 3. Impression upon the right aspect of the trachea. On 08/30/2018 cervical spine MR, there is a large mass within the right thyroid gland which contributes to this appearance. These results will be called to the ordering clinician or representative by the Radiologist Assistant, and communication documented in the PACS or zVision Dashboard. Electronically Signed   By: Genia Del M.D.   On: 08/31/2018 07:34   US Thyroid  Result Date: 09/01/2018 CLINICAL DATA:  Palpable abnormality.  Thyroid mass. EXAM: THYROID ULTRASOUND TECHNIQUE: Ultrasound examination of the thyroid gland and adjacent soft tissues was performed. COMPARISON:  None. FINDINGS: Parenchymal Echotexture: Markedly heterogenous Isthmus: 0.4 cm Right lobe: 7.4 x 3.5 x 4.8 cm Left lobe: 5.1 x 1.5 x 2.1 cm _________________________________________________________ Estimated total number of nodules >/= 1 cm: 2 Number of spongiform nodules >/=  2 cm not described below (TR1): 0 Number of mixed cystic and solid nodules >/= 1.5 cm not described below (TR2): 0  _________________________________________________________ Nodule # 1: Location: Right; Mid Maximum size: 5.3 cm;  Other 2 dimensions: 4.4 x 3.6 cm Composition: solid/almost completely solid (2) Echogenicity: hypoechoic (2) Shape: not taller-than-wide (0) Margins: smooth (0) Echogenic foci: none (0) ACR TI-RADS total points: 4. ACR TI-RADS risk category: TR4 (4-6 points). ACR TI-RADS recommendations: **Given size (>/= 1.5 cm) and appearance, fine needle aspiration of this moderately suspicious nodule should be considered based on TI-RADS criteria. _________________________________________________________ Nodule # 2: Location: Left; Inferior Maximum size: 1.1 cm; Other 2 dimensions: 1.0 x 0.6 cm Composition: solid/almost completely solid (2) Echogenicity: isoechoic (1) Shape: not taller-than-wide (0) Margins: smooth (0) Echogenic foci: none (0) ACR TI-RADS total points: 3. ACR TI-RADS risk category: TR3 (3 points). ACR TI-RADS recommendations: Given size (<1.4 cm) and appearance, this nodule does NOT meet TI-RADS criteria for biopsy or dedicated follow-up. _________________________________________________________ Other nodules measure 0.9 cm or less in size and do not meet criteria for biopsy nor follow-up. IMPRESSION: Right nodule 1 meets criteria for fine needle aspiration biopsy. Other nodules do not meet criteria for biopsy nor follow-up. The above is in keeping with the ACR TI-RADS recommendations - J Am Coll Radiol 2017;14:587-595. Electronically Signed   By: Marybelle Killings M.D.   On: 09/01/2018 10:37     Subjective:   Discharge Exam: Vitals:   09/04/18 0800 09/04/18 0920  BP: (!) 158/67   Pulse: (!) 48 63  Resp: 15   Temp: 98.1 F (36.7 C)   SpO2:     Vitals:   09/04/18 0200 09/04/18 0400 09/04/18 0800 09/04/18 0920  BP:  (!) 148/58 (!) 158/67   Pulse:   (!) 48 63  Resp: '14 13 15   ' Temp:  97.6 F (36.4 C) 98.1 F (36.7 C)   TempSrc:  Oral Oral   SpO2:      Weight:      Height:         General exam: Appears calm and comfortable  Respiratory system: Clear to auscultation. Respiratory effort normal. Cardiovascular system: Regular rate and rhythm, no murmurs Gastrointestinal system: Abdomen is nondistended, soft and nontender. No organomegaly or masses felt. Normal bowel sounds heard. Central nervous system: Alert and oriented.Bilateral upper extremity and lower extremity strength is 4+ out of 5, diminished sensation in bilateral upper extremity and lower extremity, cervical collar in place Extremities: 1+ lower extremity edema Skin: No visible rash, cervical collar in place Psychiatry: Judgement and insight appear normal. Mood & affect appropriate.    The results of significant diagnostics from this hospitalization (including imaging, microbiology, ancillary and laboratory) are listed below for reference.     Microbiology: Recent Results (from the past 240 hour(s))  Urine culture     Status: Abnormal   Collection Time: 08/30/18  8:47 PM  Result Value Ref Range Status   Specimen Description   Final    URINE, RANDOM Performed at [facility], [address] [place], [place] [address]    Special Requests   Final    NONE Performed at [facility], [address] [place], [place] [address]    Culture MULTIPLE SPECIES PRESENT, SUGGEST RECOLLECTION (A)  Final   Report Status 09/01/2018 FINAL  Final     Labs: BNP (last 3 results) No results for input(s): BNP in the last 8760 hours. Basic Metabolic Panel: Recent Labs  Lab 08/30/18 2047 08/31/18 0419 09/02/18 0300 09/03/18 0320 09/04/18 0410  NA 139 138 139 141 139  K 4.6 4.1 3.9 3.8 4.0  CL 104 106 110 112* 106  CO2 '25 23 22 22 ' 25  GLUCOSE 121* 158* 157* 157* 127*  BUN 23 18 30* 37* 41*  CREATININE 1.07* 0.92 1.10* 1.08* 1.13*  CALCIUM 9.8 9.4 8.7* 8.9 8.9  MG  --   --   --  2.4  --    Liver Function Tests: Recent Labs  Lab 08/30/18 2047  AST 18  ALT 10  ALKPHOS 101   BILITOT 0.4  PROT 7.9  ALBUMIN 4.0   No results for input(s): LIPASE, AMYLASE in the last 168 hours. No results for input(s): AMMONIA in the last 168 hours. CBC: Recent Labs  Lab 08/30/18 2047 09/02/18 0300 09/03/18 0320 09/04/18 0410  WBC 6.2 5.1 4.8 6.2  NEUTROABS  --  4.0  --   --   HGB 11.3* 9.6* 9.9* 10.0*  HCT 33.2* 31.8* 32.6* 32.7*  MCV 90.3 94.1 94.5 92.6  PLT 256 260 262 262   Cardiac Enzymes: No results for input(s): CKTOTAL, CKMB, CKMBINDEX, TROPONINI in the last 168 hours. BNP: Invalid input(s): POCBNP CBG: No results for input(s): GLUCAP in the last 168 hours. D-Dimer No results for input(s): DDIMER in the last 72 hours. Hgb A1c No results for input(s): HGBA1C in the last 72 hours. Lipid Profile No results for input(s): CHOL, HDL, LDLCALC, TRIG, CHOLHDL, LDLDIRECT in the last 72 hours. Thyroid function studies No results for input(s): TSH, T4TOTAL, T3FREE, THYROIDAB in the last 72 hours.  Invalid input(s): FREET3 Anemia work up No results for input(s): VITAMINB12, FOLATE, FERRITIN, TIBC, IRON, RETICCTPCT in the last 72 hours. Urinalysis    Component Value Date/Time   COLORURINE YELLOW (A) 08/30/2018 2047   APPEARANCEUR HAZY (A) 08/30/2018 2047   APPEARANCEUR Cloudy 11/28/2011 2236   LABSPEC 1.015 08/30/2018 2047   LABSPEC 1.010 11/28/2011 2236   PHURINE 5.0 08/30/2018 2047   GLUCOSEU NEGATIVE 08/30/2018 2047   GLUCOSEU Negative 11/28/2011 2236   HGBUR NEGATIVE 08/30/2018 2047   BILIRUBINUR NEGATIVE 08/30/2018 2047   BILIRUBINUR Negative 11/28/2011 2236   [place] NEGATIVE 08/30/2018 2047   PROTEINUR 30 (A) 08/30/2018 2047   NITRITE NEGATIVE 08/30/2018 2047   LEUKOCYTESUR MODERATE (A) 08/30/2018 2047   LEUKOCYTESUR Negative 11/28/2011 2236   Sepsis Labs Invalid input(s): PROCALCITONIN,  WBC,  LACTICIDVEN Microbiology Recent Results (from the past 240 hour(s))  Urine culture     Status: Abnormal   Collection Time: 08/30/18  8:47 PM   Result Value Ref Range Status   Specimen Description   Final    URINE, RANDOM Performed at [facility], [address] [place], [place] [address]    Special Requests   Final    NONE Performed at [facility], [address] [place], [place] [address]    Culture MULTIPLE SPECIES PRESENT, SUGGEST RECOLLECTION (A)  Final   Report Status 09/01/2018 FINAL  Final     Time coordinating discharge: Over 30 minutes  SIGNED:   Cristy Folks, MD  [facility] 09/04/2018, 10:38 AM  If 7PM-7AM, please contact night-coverage [email or web address] Password [email or web address]

## 2018-09-04 NOTE — Care Management Note (Signed)
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  Pt with cystic mass of Rt kidney, numerous pulmonary nodules, and Rt thyroid nodule/mass.  CT also showed lesion of the Rt medial iliac crest, compatible with bone mets.  PTA, pt independent of ADLS; she lives alone, but has supportive family.                    Action/Plan: Oncology following to facilitate treatment plan.  Will continue to follow progress/offer support.    Expected Discharge Date:  09/04/18               Expected Discharge Plan:  [place]  In-House Referral:     Discharge planning Services  CM Consult  Post Acute Care Choice:  Home Health Choice offered to:  Patient  DME Arranged:    DME Agency:     HH Arranged:  PT, OT HH Agency:  [place]  Status of Service:  Completed, signed off  If discussed at [place] of Stay Meetings, dates discussed:    Additional Comments:  09/04/18 J. Allister Lessley, RN, BSN Pt medically stable for discharge home today.  PT/OT recommending [place] follow up; pt agreeable to [facility] follow up.  Referral to [facility] for [facility] needs, per pt choice; start of care 24-48h post dc date.  Pt denies any DME needs for home. Pt requests [place] call son Nehemiah Montee at [phone] or daughter in law Emilea Goga at [phone].    Reinaldo Raddle, RN, BSN  Trauma/Neuro ICU Case Manager [phone]

## 2018-09-07 ENCOUNTER — Other Ambulatory Visit: Payer: Medicare Other

## 2018-09-07 NOTE — Consult Note (Signed)
            [facility] CM Primary Care Navigator  09/07/2018  Eileen Norris 08-27-1941 [id number]   Attempt to seepatient at the bedside to identify possible discharge needs butshe wasalreadydischargedtowards the weekend. Patient went home with home health services, per Inpatient CM note.  Per MD note,[facility]was [facility]surgical evaluation. Patient was noted to have impingement of her cervical spine due to metastatic cancer (in multiple bones) of unknown primary. (pathologic fracture of cervical vertebra)  Patient has discharge instruction to follow-up withprimary care provider in 1 week, oncology in 1 week and outpatient follow-up with neurosurgery.   For additional questions please contact:  Edwena Felty A. Kaiser Belluomini, BSN, RN-BC [facility] PRIMARY CARE Navigator Cell: [phone]

## 2018-09-09 DIAGNOSIS — M8458XD Pathological fracture in neoplastic disease, other specified site, subsequent encounter for fracture with routine healing: Secondary | ICD-10-CM | POA: Diagnosis not present

## 2018-09-09 DIAGNOSIS — C7801 Secondary malignant neoplasm of right lung: Secondary | ICD-10-CM | POA: Diagnosis not present

## 2018-09-09 DIAGNOSIS — Z510 Encounter for antineoplastic radiation therapy: Secondary | ICD-10-CM | POA: Insufficient documentation

## 2018-09-09 DIAGNOSIS — Z981 Arthrodesis status: Secondary | ICD-10-CM | POA: Diagnosis not present

## 2018-09-09 DIAGNOSIS — D440 Neoplasm of uncertain behavior of thyroid gland: Secondary | ICD-10-CM | POA: Diagnosis not present

## 2018-09-09 DIAGNOSIS — G893 Neoplasm related pain (acute) (chronic): Secondary | ICD-10-CM | POA: Diagnosis not present

## 2018-09-09 DIAGNOSIS — M4322 Fusion of spine, cervical region: Secondary | ICD-10-CM | POA: Diagnosis not present

## 2018-09-09 DIAGNOSIS — C7951 Secondary malignant neoplasm of bone: Secondary | ICD-10-CM | POA: Diagnosis not present

## 2018-09-09 DIAGNOSIS — M4326 Fusion of spine, lumbar region: Secondary | ICD-10-CM | POA: Diagnosis not present

## 2018-09-09 DIAGNOSIS — C649 Malignant neoplasm of unspecified kidney, except renal pelvis: Secondary | ICD-10-CM | POA: Diagnosis not present

## 2018-09-09 DIAGNOSIS — D49511 Neoplasm of unspecified behavior of right kidney: Secondary | ICD-10-CM | POA: Diagnosis not present

## 2018-09-09 DIAGNOSIS — C7802 Secondary malignant neoplasm of left lung: Secondary | ICD-10-CM | POA: Diagnosis not present

## 2018-09-09 DIAGNOSIS — K219 Gastro-esophageal reflux disease without esophagitis: Secondary | ICD-10-CM | POA: Diagnosis not present

## 2018-09-09 DIAGNOSIS — I10 Essential (primary) hypertension: Secondary | ICD-10-CM | POA: Diagnosis not present

## 2018-09-09 DIAGNOSIS — Z96653 Presence of artificial knee joint, bilateral: Secondary | ICD-10-CM | POA: Diagnosis not present

## 2018-09-10 DIAGNOSIS — I10 Essential (primary) hypertension: Secondary | ICD-10-CM | POA: Diagnosis not present

## 2018-09-10 DIAGNOSIS — M4326 Fusion of spine, lumbar region: Secondary | ICD-10-CM | POA: Diagnosis not present

## 2018-09-10 DIAGNOSIS — D49511 Neoplasm of unspecified behavior of right kidney: Secondary | ICD-10-CM | POA: Diagnosis not present

## 2018-09-10 DIAGNOSIS — M8458XD Pathological fracture in neoplastic disease, other specified site, subsequent encounter for fracture with routine healing: Secondary | ICD-10-CM | POA: Diagnosis not present

## 2018-09-10 DIAGNOSIS — M4322 Fusion of spine, cervical region: Secondary | ICD-10-CM | POA: Diagnosis not present

## 2018-09-10 DIAGNOSIS — C7801 Secondary malignant neoplasm of right lung: Secondary | ICD-10-CM | POA: Diagnosis not present

## 2018-09-10 DIAGNOSIS — C7951 Secondary malignant neoplasm of bone: Secondary | ICD-10-CM | POA: Diagnosis not present

## 2018-09-10 DIAGNOSIS — K219 Gastro-esophageal reflux disease without esophagitis: Secondary | ICD-10-CM | POA: Diagnosis not present

## 2018-09-10 DIAGNOSIS — G893 Neoplasm related pain (acute) (chronic): Secondary | ICD-10-CM | POA: Diagnosis not present

## 2018-09-10 DIAGNOSIS — C7802 Secondary malignant neoplasm of left lung: Secondary | ICD-10-CM | POA: Diagnosis not present

## 2018-09-10 DIAGNOSIS — Z981 Arthrodesis status: Secondary | ICD-10-CM | POA: Diagnosis not present

## 2018-09-10 DIAGNOSIS — D440 Neoplasm of uncertain behavior of thyroid gland: Secondary | ICD-10-CM | POA: Diagnosis not present

## 2018-09-10 DIAGNOSIS — Z96653 Presence of artificial knee joint, bilateral: Secondary | ICD-10-CM | POA: Diagnosis not present

## 2018-09-10 NOTE — Progress Notes (Signed)
Brain and Spine Tumor Board Documentation  Eileen Norris was presented by Cecil Cobbs, MD at Brain and Spine Tumor Board on 09/10/2018, which included representatives from neuro oncology, radiation oncology, surgical oncology, navigation, pathology, radiology.  Eileen Norris was presented as a current patient with history of the following treatments:  .  Additionally, we reviewed previous medical and familial history, history of present illness, and recent lab results along with all available histopathologic and imaging studies. The tumor board considered available treatment options and made the following recommendations:  Radiation therapy (primary modality) SRS to C7 region  Tumor board is a meeting of clinicians from various specialty areas who evaluate and discuss patients for whom a multidisciplinary approach is being considered. Final determinations in the plan of care are those of the provider(s). The responsibility for follow up of recommendations given during tumor board is that of the provider.   Today's extended care, comprehensive team conference, Eileen Norris was not present for the discussion and was not examined.

## 2018-09-14 ENCOUNTER — Ambulatory Visit
Admission: RE | Admit: 2018-09-14 | Discharge: 2018-09-14 | Disposition: A | Discharge status: Home / Self Care | Payer: Medicare Other | Source: Ambulatory Visit | Attending: Radiation Oncology | Admitting: Radiation Oncology

## 2018-09-14 ENCOUNTER — Encounter: Payer: Self-pay | Admitting: Radiation Oncology

## 2018-09-14 DIAGNOSIS — C649 Malignant neoplasm of unspecified kidney, except renal pelvis: Secondary | ICD-10-CM | POA: Diagnosis not present

## 2018-09-14 DIAGNOSIS — Z510 Encounter for antineoplastic radiation therapy: Secondary | ICD-10-CM | POA: Diagnosis not present

## 2018-09-14 DIAGNOSIS — C7951 Secondary malignant neoplasm of bone: Secondary | ICD-10-CM | POA: Diagnosis not present

## 2018-09-14 NOTE — Op Note (Signed)
   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 Kyung Rudd and Dr. Ledon Snare, the radiation oncology physicians, and physicist.  I participated closely in the radiation treatment planning afterwards. The patient underwent planning CT which was fused to the MRI.  These images were fused on the planning system.  Radiation oncology contoured the gross target volume and subsequently expanded this to yield the Planning Target Volume. I actively participated in the planning process.  I helped to define and review the target contours and also the contours of the spinal cord, and selected nearby organs at risk.  All the dose constraints for critical structures were reviewed and compared to AAPM Task Group 101.  The prescription dose conformity was reviewed.  I approved the plan electronically.    Accordingly, Eileen Norris was brought to the TrueBeam stereotactic radiation treatment linac and placed in the custom immobilization device.  The patient was aligned according to the IR fiducial markers with BrainLab Exactrac, then orthogonal x-rays were used in ExacTrac with the 6DOF robotic table and the shifts were made to align the patient.  Then conebeam CT was performed to verify precision.  Eileen Norris received stereotactic radiosurgery uneventfully.  The detailed description of the procedure is recorded in the radiation oncology procedure note.  I was present for the duration of the procedure.  DISPOSITION:  Following delivery, the patient was transported to nursing  in stable condition and monitored for possible acute effects to be discharged to home in stable condition with follow-up in one month.  Hosie Spangle, MD 09/14/2018 12:59 PM

## 2018-09-15 ENCOUNTER — Telehealth: Payer: Self-pay | Admitting: Pharmacist

## 2018-09-15 ENCOUNTER — Telehealth: Payer: Self-pay | Admitting: Oncology

## 2018-09-15 ENCOUNTER — Inpatient Hospital Stay: Payer: Medicare Other | Attending: Oncology | Admitting: Oncology

## 2018-09-15 VITALS — BP 123/56 | HR 58 | Temp 98.2°F | Resp 17 | Ht 62.0 in | Wt 183.8 lb

## 2018-09-15 DIAGNOSIS — C7951 Secondary malignant neoplasm of bone: Secondary | ICD-10-CM | POA: Diagnosis not present

## 2018-09-15 DIAGNOSIS — C641 Malignant neoplasm of right kidney, except renal pelvis: Secondary | ICD-10-CM

## 2018-09-15 DIAGNOSIS — Z923 Personal history of irradiation: Secondary | ICD-10-CM | POA: Diagnosis not present

## 2018-09-15 DIAGNOSIS — C649 Malignant neoplasm of unspecified kidney, except renal pelvis: Secondary | ICD-10-CM

## 2018-09-15 DIAGNOSIS — Z79899 Other long term (current) drug therapy: Secondary | ICD-10-CM | POA: Insufficient documentation

## 2018-09-15 MED ORDER — CABOZANTINIB S-MALATE 20 MG PO TABS: 20.0000 mg | ORAL_TABLET | Freq: Every day | ORAL | 0 refills | Status: DC

## 2018-09-15 MED ORDER — TRAMADOL HCL 50 MG PO TABS: 50.0000 mg | ORAL_TABLET | Freq: Four times a day (QID) | ORAL | 1 refills | Status: DC | PRN

## 2018-09-15 NOTE — Telephone Encounter (Signed)
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   Counseled patient on administration, dosing, side effects, monitoring, drug-food interactions, safe handling, storage, and disposal.  Patient will take Cabometyx 20 mg tablets, 1 tablet (20 mg) by mouth once daily on an empty stomach, 1 hour before or 2 hours after a meal.  Patient understands that dose may increase based on toleration of the 20 mg daily dosing.  Patient knows to avoid grapefruit and grapefruit juice.  Cabometyx start date: TBD, pending medication acquisition  Adverse effects include but are not limited to: diarrhea, nausea, decreased appetite, fatigue, hypertension, hand-foot syndrome, decreased blood counts, and electrolyte abnormalities. Patient will obtain anti diarrheal and alert the office of 4 or more loose stools above baseline.  Reviewed with patient importance of keeping a medication schedule and plan for any missed doses.  [name] voiced understanding and appreciation.   All questions answered. Medication reconciliation performed and medication/allergy list updated.  Will follow up with patient regarding insurance and pharmacy. Insurance authorization will be submitted.  We extensively discussed medicare copayment and possibility for high copayment. We discussed options for copayment assistance including copayment grant foundations and manufacturer compassionate use program. Patient instructed that if they are enrolled into manufacturer compassionate use program that medication will be shipped directly to their home from dispensing pharmacy of program's choosing.  Patient knows to call the office with questions or concerns. Oral Oncology Clinic will continue to  follow.  Eileen Norris, PharmD, BCPS, BCOP  09/15/2018 11:16 AM Oral Oncology Clinic [phone]

## 2018-09-15 NOTE — Telephone Encounter (Addendum)
Oral Oncology Pharmacist Encounter  Insurance authorization for Cabometyx is required by [place] healthcare PA submitted on cover my meds  20 mg tablets KEY: [id number] Status is approved Effective dates: 09/15/18-11/24/18  60 mg tablets (submitted for future dose increases) KEY: [id number] Status is approved Effective dates: 09/15/18-11/24/18  [name], PharmD, BCPS, BCOP  09/15/2018 1:52 PM Oral Oncology Clinic [phone]

## 2018-09-15 NOTE — Progress Notes (Signed)
Hematology and Oncology Follow Up Visit  Eileen Norris [id number] May 27, 1941 77 y.o. 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  She presented with right kidney mass and bone metastasis and cervical spine.   Prior Therapy:  He is status post percutaneous biopsy of a right iliac metastatic lesion on 09/02/2018.  C7 stereotactic radiosurgery completed on 09/14/2018 under the care of Dr. Sherwood Norris and Dr. Lisbeth Norris.  Current therapy: Under evaluation for systemic therapy.   Interim History: Eileen Norris presents today for a follow-up visit.  She is a pleasant woman I saw in consultation after presenting with kidney mass and metastatic disease to the bone and cervical spine.  Since her discharge, she reports improvement in her overall health.  She tolerated stereotactic radiosurgery of the spine with no significant complications.  She does report some mild fatigue and tiredness but is improving at this time.  She does report neck and arm pain which despite Neurontin and Tylenol having some issues with it.  She does report lower extremity edema as well.  Her performance status is improving although remains marginal.  She does not report any headaches, blurry vision, syncope or seizures. Does not report any fevers, chills or sweats.  Does not report any cough, wheezing or hemoptysis.  Does not report any chest pain, palpitation, orthopnea or leg edema.  Does not report any nausea, vomiting or abdominal pain.  Does not report any constipation or diarrhea.  Does not report any skeletal complaints.    Does not report frequency, urgency or hematuria.  Does not report any skin rashes or lesions. Does not report any heat or cold intolerance.  Does not report any lymphadenopathy or petechiae.  Does not report any anxiety or depression.  Remaining review of systems  is negative.    Medications: I have reviewed the patient's current medications.  Current Outpatient Medications  Medication Sig Dispense Refill  . amLODipine (NORVASC) 10 MG tablet Take 1 tablet (10 mg total) by mouth daily. 30 tablet 1  . Cholecalciferol (VITAMIN D-3) 1000 units CAPS Take 1,000 Units by mouth daily.    Marland Kitchen dexamethasone (DECADRON) 4 MG tablet Take 2 tablets (8 mg total) by mouth 2 (two) times daily. 120 tablet 0  . docusate sodium (COLACE) 100 MG capsule Take 100 mg by mouth daily as needed for mild constipation.    . ferrous sulfate 325 (65 FE) MG EC tablet Take 325 mg by mouth daily with breakfast.    . gabapentin (NEURONTIN) 100 MG capsule Take 100 mg by mouth See admin instructions. Taking 2 capsules (200mg ) in the AM, then 1 capsule (100mg ) at noon, then 2 capsules (200mg ) at bedtime.    . hydrochlorothiazide (HYDRODIURIL) 50 MG tablet Take 50 mg by mouth daily.     . meloxicam (MOBIC) 7.5 MG tablet Take 7.5 mg by mouth 2 times daily at 12 noon and 4 pm.    . metoprolol (LOPRESSOR) 50 MG tablet Take 50 mg by mouth 2 (two) times daily.       No current facility-administered medications for this visit.      Allergies:  Allergies  Allergen Reactions  . Advil [Ibuprofen] Other (See Comments)    "Gives me the shakes"  . Prilosec [Omeprazole] Other (See Comments)    Constipation    Past Medical History, Surgical history, Social history, and Family History were reviewed and updated.  Review of Systems:    Physical Exam: Blood pressure (!) 123/56, pulse (!) 58, temperature 98.2 F (36.8 C), temperature source Oral, resp. rate 17, height 5\' 2"  (1.575 m), weight 183 lb 12.8 oz (83.4 kg), SpO2 100 %.   ECOG:  2 General appearance: alert and cooperative appeared without distress. Head: Normocephalic, without obvious abnormality Oropharynx: No oral thrush or ulcers. Eyes: No scleral icterus.  Pupils are equal and round reactive to light. Lymph nodes: Cervical,  supraclavicular, and axillary nodes normal. Heart:regular rate and rhythm, S1, S2 normal, no murmur, click, rub or gallop Lung:chest clear, no wheezing, rales, normal symmetric air entry Abdomin: soft, non-tender, without masses or organomegaly. Neurological: No motor, sensory deficits.  Intact deep tendon reflexes. Skin: No rashes or lesions.  No ecchymosis or petechiae. Musculoskeletal: No joint deformity or effusion. Psychiatric: Mood and affect are appropriate.    Lab Results: Lab Results  Component Value Date   WBC 6.2 09/04/2018   HGB 10.0 (L) 09/04/2018   HCT 32.7 (L) 09/04/2018   MCV 92.6 09/04/2018   PLT 262 09/04/2018     Chemistry      Component Value Date/Time   NA 139 09/04/2018 0410   NA 144 11/30/2011 0418   K 4.0 09/04/2018 0410   K 3.6 11/30/2011 0418   CL 106 09/04/2018 0410   CL 105 11/30/2011 0418   CO2 25 09/04/2018 0410   CO2 29 11/30/2011 0418   BUN 41 (H) 09/04/2018 0410   BUN 19 (H) 11/30/2011 0418   CREATININE 1.13 (H) 09/04/2018 0410   CREATININE 0.98 11/30/2011 0418      Component Value Date/Time   CALCIUM 8.9 09/04/2018 0410   CALCIUM 8.6 11/30/2011 0418   ALKPHOS 101 08/30/2018 2047   ALKPHOS 70 11/28/2011 2152   AST 18 08/30/2018 2047   AST 25 11/28/2011 2152   ALT 10 08/30/2018 2047   ALT 23 11/28/2011 2152   BILITOT 0.4 08/30/2018 2047   BILITOT 0.3 11/28/2011 2152       Radiological Studies:  EXAM: CT CHEST, ABDOMEN, AND PELVIS WITH CONTRAST  TECHNIQUE: Multidetector CT imaging of the chest, abdomen and pelvis was performed following the standard protocol during bolus administration of intravenous contrast.  CONTRAST:  123mL OMNIPAQUE IOHEXOL 300 MG/ML  SOLN  COMPARISON:  MRI cervical spine 08/30/2018.  FINDINGS: CT CHEST FINDINGS  Cardiovascular: Heart is normal size. Scattered aortic arch calcifications. Aorta is normal caliber. No dissection.  Mediastinum/Nodes: No mediastinal, hilar, or axillary  adenopathy. Enlarged, heterogeneous appearance of the right thyroid lobe, likely replaced by large mass measuring 4.6 x 4.1 cm on axial imaging. Small nodule in the left thyroid lobe.  Lungs/Pleura: Numerous bilateral pulmonary nodules most compatible with metastases. Index lingular nodule measures 11 mm on image 74. Index left lower lobe nodule measures 7 mm on image 82. Index right lower lobe nodule measures 7 mm on image 77. Numerous other similarly sized nodules. No effusions.  Musculoskeletal: Pathologic fracture again seen at the C7 level. No additional focal bone lesion.  CT ABDOMEN PELVIS FINDINGS  Hepatobiliary: No focal hepatic abnormality. Gallbladder unremarkable.  Pancreas: No focal abnormality or ductal dilatation.  Spleen: No focal abnormality.  Normal size.  Adrenals/Urinary Tract: Large enhancing mass within the lower pole of the right kidney measures 6.5 x 5.3 cm most compatible with renal cell carcinoma. Small cysts in the left kidney. No hydronephrosis. Urinary bladder unremarkable.  Stomach/Bowel: Stomach, large and small bowel grossly unremarkable.  Vascular/Lymphatic: Aortic atherosclerosis. No enlarged  abdominal or pelvic lymph nodes.  Reproductive: Prior hysterectomy.  No adnexal masses.  Other: Trace free fluid in the pelvis.  No free air.  Musculoskeletal: Postoperative changes in the lower lumbar spine posterior fusion. Destructive lytic lesion noted in the medial right iliac bone measuring up to 3.1 cm. Probable early lesion with early cortical scalloping noted in the left iliac bone, measuring 2.1 cm. Mixed lytic and sclerotic lesion noted more inferiorly in the left iliac bone adjacent to the SI joint.  IMPRESSION: Large enhancing mixed solid and cystic mass within the mid to lower pole of the right kidney compatible with renal cell carcinoma. This measures up to 6.5 cm. Numerous pulmonary nodules within both lungs compatible  with pulmonary metastases.  Lytic destructive lesion within the right medial iliac crest with probable early lesion in a similar position in the left iliac bone. Mixed lytic and sclerotic lesion more inferiorly within the left iliac bone. Findings compatible with bone metastases.  Right thyroid nodule/mass measuring up to 4.6 cm. This may reflect multinodular goiter. This could be further characterized with thyroid ultrasound.  Trace free fluid in the pelvis.   Impression and Plan:  77 year old woman with the following:  1.  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  This was diagnosed in October 2019 with biopsy-proven to iliac crest lesion.  The natural course of this disease was reviewed with the patient and her family.  Treatment options were discussed.  Any treatment at this time is palliative in nature and these options would include immunotherapy versus oral targeted therapy at this time.  He would be a marginal candidate for combined immunotherapy agents at this time.  After discussion today, I have recommended proceeding with Cabometyx 20 mg daily and titrate higher depending on her tolerance.  Complications include nausea, fatigue, diarrhea and hypertension were also illustrated.  Is agreeable to proceed.   2.  Cervical spine metastasis: She is status post radiation therapy in the form of stereotactic radiosurgery completed on 09/14/2018.  She is currently on dexamethasone 8 mg twice a day and I have instructed her to taper it over the next 2 weeks with instructions how to do so.  3.  Prognosis and goals of care: Therapy is palliative with disease that is incurable.  His performance status is adequate and aggressive therapy is warranted at this time.  4.  Pain: Prescription for Ultram was given to the patient for the time being with instructions how to use it.  5.  Follow-up: We will be in 4 weeks  to follow her progress.  25  minutes was spent with the patient face-to-face today.  More than 50% of time was dedicated to reviewing the natural course of her disease, treatment options and coordinating plan of care.    Zola Button, MD 10/22/201910:28 AM

## 2018-09-15 NOTE — Telephone Encounter (Signed)
Oral Oncology Pharmacist Encounter  Patient found to have [place] and affordable copayment for Cabometyx. EASE application has been cancelled.  [name], PharmD, BCPS, BCOP  09/15/2018 2:37 PM Oral Oncology Clinic [phone]

## 2018-09-15 NOTE — Telephone Encounter (Signed)
Oral Oncology Pharmacist Encounter  Insurance authorization for Cabometyx has been approved. Test claim at the [place] revealed copayment $8.50. This is affordable to patient at this time. EASE application has been canceled.  Called and spoke with patient and provided her with good news. She requested I call and update her son, Merry Proud. I called and updated Merry Proud on insurance authorization and copayment. They will pick up the first fill of Cabometyx from the [facility] tomorrow (09/16/2018) after 2 PM. Merry Proud requested that I call and update patient's daughter, [name].  I called and spoke with [name] and updated her on insurance authorization and copayment. She confirms they will pick up the first fill of Cabometyx from the [facility] tomorrow (09/16/2018). [name] also requests that the pharmacy contact her each month for refills. The pharmacy has been updated with all of this information.  They know to call the office with any additional questions or concerns.  [name], PharmD, BCPS, BCOP  09/15/2018 2:38 PM Oral Oncology Clinic [phone]

## 2018-09-15 NOTE — Telephone Encounter (Signed)
Oral Oncology Pharmacist Encounter  Received new prescription for Cabometyx (cabozantinib) for the treatment of newly diagnosed poorly differentiated carcinoma arising from the kidney, planned duration until disease progression or unacceptable toxicity.  Labs from Epic assessed, okay for treatment. BPs reviewed, readings WNL, will continue to be monitored 08/30/2018 urinalysis shows 1+ urine protein, will continue to be monitored  Current medication list in Epic reviewed, no DDIs with Cabometyx identified.  Prescription has been e-scribed to the [facility] for benefits analysis and approval.  Oral Oncology Clinic will continue to follow for insurance authorization, copayment issues, initial counseling and start date.  [name], PharmD, BCPS, BCOP  09/15/2018 11:15 AM Oral Oncology Clinic [phone]

## 2018-09-15 NOTE — Telephone Encounter (Signed)
Oral Oncology Pharmacist Encounter  Completed application for Cabometyx patient assistance program (EASE) faxed to [phone]  No copayment grant foundations are currently open for renal cell carcinoma.  This encounter will continue to be updated until final determination.  [name], PharmD, BCPS, BCOP  09/15/2018 2:16 PM Oral Oncology Clinic [phone]

## 2018-09-15 NOTE — Telephone Encounter (Signed)
Scheduled apt per 10/22 los - gave patient aVS and calender per los.

## 2018-09-16 ENCOUNTER — Encounter: Payer: Self-pay | Admitting: Radiation Therapy

## 2018-09-16 DIAGNOSIS — C7801 Secondary malignant neoplasm of right lung: Secondary | ICD-10-CM | POA: Diagnosis not present

## 2018-09-16 DIAGNOSIS — C7951 Secondary malignant neoplasm of bone: Secondary | ICD-10-CM | POA: Diagnosis not present

## 2018-09-16 DIAGNOSIS — Z981 Arthrodesis status: Secondary | ICD-10-CM | POA: Diagnosis not present

## 2018-09-16 DIAGNOSIS — M4326 Fusion of spine, lumbar region: Secondary | ICD-10-CM | POA: Diagnosis not present

## 2018-09-16 DIAGNOSIS — I10 Essential (primary) hypertension: Secondary | ICD-10-CM | POA: Diagnosis not present

## 2018-09-16 DIAGNOSIS — M8458XD Pathological fracture in neoplastic disease, other specified site, subsequent encounter for fracture with routine healing: Secondary | ICD-10-CM | POA: Diagnosis not present

## 2018-09-16 DIAGNOSIS — K219 Gastro-esophageal reflux disease without esophagitis: Secondary | ICD-10-CM | POA: Diagnosis not present

## 2018-09-16 DIAGNOSIS — C7802 Secondary malignant neoplasm of left lung: Secondary | ICD-10-CM | POA: Diagnosis not present

## 2018-09-16 DIAGNOSIS — Z96653 Presence of artificial knee joint, bilateral: Secondary | ICD-10-CM | POA: Diagnosis not present

## 2018-09-16 DIAGNOSIS — D49511 Neoplasm of unspecified behavior of right kidney: Secondary | ICD-10-CM | POA: Diagnosis not present

## 2018-09-16 DIAGNOSIS — G893 Neoplasm related pain (acute) (chronic): Secondary | ICD-10-CM | POA: Diagnosis not present

## 2018-09-16 DIAGNOSIS — D440 Neoplasm of uncertain behavior of thyroid gland: Secondary | ICD-10-CM | POA: Diagnosis not present

## 2018-09-16 DIAGNOSIS — M4322 Fusion of spine, cervical region: Secondary | ICD-10-CM | POA: Diagnosis not present

## 2018-09-16 MED FILL — CABOMETYX 20 MG TABLET: 20 | 30 days supply | Qty: 30 | Fill #0

## 2018-09-16 NOTE — Progress Notes (Signed)
Dr. Sherwood Gambler asked to see the pt. for a 1 mo follow-up and plain film X-rays of her cervical spine. This is scheduled for 11/18 @ 2:30. Rather than have her come to see [place] too, we will see her at the 3 mo visit and get an MRI at that time as well.   Mont Dutton R.T.(R)(T) Special Procedures Navigator  [phone]

## 2018-09-17 DIAGNOSIS — G893 Neoplasm related pain (acute) (chronic): Secondary | ICD-10-CM | POA: Diagnosis not present

## 2018-09-17 DIAGNOSIS — C7802 Secondary malignant neoplasm of left lung: Secondary | ICD-10-CM | POA: Diagnosis not present

## 2018-09-17 DIAGNOSIS — D440 Neoplasm of uncertain behavior of thyroid gland: Secondary | ICD-10-CM | POA: Diagnosis not present

## 2018-09-17 DIAGNOSIS — M8458XD Pathological fracture in neoplastic disease, other specified site, subsequent encounter for fracture with routine healing: Secondary | ICD-10-CM | POA: Diagnosis not present

## 2018-09-17 DIAGNOSIS — D49511 Neoplasm of unspecified behavior of right kidney: Secondary | ICD-10-CM | POA: Diagnosis not present

## 2018-09-17 DIAGNOSIS — M4322 Fusion of spine, cervical region: Secondary | ICD-10-CM | POA: Diagnosis not present

## 2018-09-17 DIAGNOSIS — C7951 Secondary malignant neoplasm of bone: Secondary | ICD-10-CM | POA: Diagnosis not present

## 2018-09-17 DIAGNOSIS — I10 Essential (primary) hypertension: Secondary | ICD-10-CM | POA: Diagnosis not present

## 2018-09-17 DIAGNOSIS — M4326 Fusion of spine, lumbar region: Secondary | ICD-10-CM | POA: Diagnosis not present

## 2018-09-17 DIAGNOSIS — C7801 Secondary malignant neoplasm of right lung: Secondary | ICD-10-CM | POA: Diagnosis not present

## 2018-09-17 DIAGNOSIS — Z981 Arthrodesis status: Secondary | ICD-10-CM | POA: Diagnosis not present

## 2018-09-17 DIAGNOSIS — Z96653 Presence of artificial knee joint, bilateral: Secondary | ICD-10-CM | POA: Diagnosis not present

## 2018-09-17 DIAGNOSIS — K219 Gastro-esophageal reflux disease without esophagitis: Secondary | ICD-10-CM | POA: Diagnosis not present

## 2018-09-17 NOTE — Telephone Encounter (Signed)
Oral Oncology Patient Advocate Encounter  Confirmed with [facility] that Cabometyx was picked up on 09/16/18 with a $8.50 copay.   [place] Patient [place] Phone [phone] Fax [phone]

## 2018-09-21 DIAGNOSIS — I10 Essential (primary) hypertension: Secondary | ICD-10-CM | POA: Diagnosis not present

## 2018-09-21 DIAGNOSIS — D49511 Neoplasm of unspecified behavior of right kidney: Secondary | ICD-10-CM | POA: Diagnosis not present

## 2018-09-21 DIAGNOSIS — G893 Neoplasm related pain (acute) (chronic): Secondary | ICD-10-CM | POA: Diagnosis not present

## 2018-09-21 DIAGNOSIS — C7802 Secondary malignant neoplasm of left lung: Secondary | ICD-10-CM | POA: Diagnosis not present

## 2018-09-21 DIAGNOSIS — Z981 Arthrodesis status: Secondary | ICD-10-CM | POA: Diagnosis not present

## 2018-09-21 DIAGNOSIS — D440 Neoplasm of uncertain behavior of thyroid gland: Secondary | ICD-10-CM | POA: Diagnosis not present

## 2018-09-21 DIAGNOSIS — M4322 Fusion of spine, cervical region: Secondary | ICD-10-CM | POA: Diagnosis not present

## 2018-09-21 DIAGNOSIS — K219 Gastro-esophageal reflux disease without esophagitis: Secondary | ICD-10-CM | POA: Diagnosis not present

## 2018-09-21 DIAGNOSIS — Z96653 Presence of artificial knee joint, bilateral: Secondary | ICD-10-CM | POA: Diagnosis not present

## 2018-09-21 DIAGNOSIS — M8458XD Pathological fracture in neoplastic disease, other specified site, subsequent encounter for fracture with routine healing: Secondary | ICD-10-CM | POA: Diagnosis not present

## 2018-09-21 DIAGNOSIS — M4326 Fusion of spine, lumbar region: Secondary | ICD-10-CM | POA: Diagnosis not present

## 2018-09-21 DIAGNOSIS — C7951 Secondary malignant neoplasm of bone: Secondary | ICD-10-CM | POA: Diagnosis not present

## 2018-09-21 DIAGNOSIS — C7801 Secondary malignant neoplasm of right lung: Secondary | ICD-10-CM | POA: Diagnosis not present

## 2018-09-21 NOTE — Telephone Encounter (Signed)
Oral Oncology Pharmacist Encounter  Received call from patient's daughter-in-law, Eileen Norris, with questions about diarrhea associated with Cabometyx.  She states Eileen Norris started her Cabometyx 20 mg daily on 09/17/2018.  Patient is not experiencing any side effects yet, and daughter-in-law wonders if the medicine is even working. Reiterated that any possible side effects that may occur could take up to 2 weeks to become evident.  All other questions answered.  Eileen Norris reminded that dose of Cabometyx may increase after next office visit with Dr. Alen Blew if patient is not experiencing any side effects at that time.  Eileen Norris expressed understanding and appreciation. They know to call the office with any additional questions or concerns.  [name], PharmD, BCPS, BCOP  09/21/2018 8:45 AM Oral Oncology Clinic [phone]

## 2018-09-22 DIAGNOSIS — G893 Neoplasm related pain (acute) (chronic): Secondary | ICD-10-CM | POA: Diagnosis not present

## 2018-09-22 DIAGNOSIS — M8458XD Pathological fracture in neoplastic disease, other specified site, subsequent encounter for fracture with routine healing: Secondary | ICD-10-CM | POA: Diagnosis not present

## 2018-09-22 DIAGNOSIS — K219 Gastro-esophageal reflux disease without esophagitis: Secondary | ICD-10-CM | POA: Diagnosis not present

## 2018-09-22 DIAGNOSIS — I10 Essential (primary) hypertension: Secondary | ICD-10-CM | POA: Diagnosis not present

## 2018-09-22 DIAGNOSIS — Z981 Arthrodesis status: Secondary | ICD-10-CM | POA: Diagnosis not present

## 2018-09-22 DIAGNOSIS — C7801 Secondary malignant neoplasm of right lung: Secondary | ICD-10-CM | POA: Diagnosis not present

## 2018-09-22 DIAGNOSIS — C7951 Secondary malignant neoplasm of bone: Secondary | ICD-10-CM | POA: Diagnosis not present

## 2018-09-22 DIAGNOSIS — D49511 Neoplasm of unspecified behavior of right kidney: Secondary | ICD-10-CM | POA: Diagnosis not present

## 2018-09-22 DIAGNOSIS — Z96653 Presence of artificial knee joint, bilateral: Secondary | ICD-10-CM | POA: Diagnosis not present

## 2018-09-22 DIAGNOSIS — M4322 Fusion of spine, cervical region: Secondary | ICD-10-CM | POA: Diagnosis not present

## 2018-09-22 DIAGNOSIS — C7802 Secondary malignant neoplasm of left lung: Secondary | ICD-10-CM | POA: Diagnosis not present

## 2018-09-22 DIAGNOSIS — D440 Neoplasm of uncertain behavior of thyroid gland: Secondary | ICD-10-CM | POA: Diagnosis not present

## 2018-09-22 DIAGNOSIS — M4326 Fusion of spine, lumbar region: Secondary | ICD-10-CM | POA: Diagnosis not present

## 2018-09-23 NOTE — Progress Notes (Signed)
  Radiation Oncology         [phone]) [phone] ________________________________  Name: Eileen Norris MRN: [id number]  Date: 09/14/2018  DOB: [birth year]  End of Treatment Note  Diagnosis:   77 yo woman with C7 metastasis from renal cell carcinoma   Indication for treatment:  Palliative       Radiation treatment dates:   09/14/2018  Site/dose:   The target lesion at C7 was treated to 18 Gy in 1 fraction.  Beams/energy:   10X-FFF // VMAT  Narrative: The patient tolerated SRS treatment relatively well.   She did not experience any acute side effects with treatment.   Plan: The patient has completed radiation treatment. The patient will return to radiation oncology clinic for routine followup in one month. I advised her to call or return sooner if she has any questions or concerns related to her recovery or treatment. ________________________________  Sheral Apley. Tammi Klippel, M.D.   This document serves as a record of services personally performed by Tyler Pita, MD. It was created on his behalf by Arlyce Harman, a trained medical scribe. The creation of this record is based on the scribe's personal observations and the provider's statements to them. This document has been checked and approved by the attending provider.

## 2018-09-24 DIAGNOSIS — Z981 Arthrodesis status: Secondary | ICD-10-CM | POA: Diagnosis not present

## 2018-09-24 DIAGNOSIS — D49511 Neoplasm of unspecified behavior of right kidney: Secondary | ICD-10-CM | POA: Diagnosis not present

## 2018-09-24 DIAGNOSIS — C7801 Secondary malignant neoplasm of right lung: Secondary | ICD-10-CM | POA: Diagnosis not present

## 2018-09-24 DIAGNOSIS — Z96653 Presence of artificial knee joint, bilateral: Secondary | ICD-10-CM | POA: Diagnosis not present

## 2018-09-24 DIAGNOSIS — K219 Gastro-esophageal reflux disease without esophagitis: Secondary | ICD-10-CM | POA: Diagnosis not present

## 2018-09-24 DIAGNOSIS — G893 Neoplasm related pain (acute) (chronic): Secondary | ICD-10-CM | POA: Diagnosis not present

## 2018-09-24 DIAGNOSIS — D440 Neoplasm of uncertain behavior of thyroid gland: Secondary | ICD-10-CM | POA: Diagnosis not present

## 2018-09-24 DIAGNOSIS — C7951 Secondary malignant neoplasm of bone: Secondary | ICD-10-CM | POA: Diagnosis not present

## 2018-09-24 DIAGNOSIS — M4326 Fusion of spine, lumbar region: Secondary | ICD-10-CM | POA: Diagnosis not present

## 2018-09-24 DIAGNOSIS — M4322 Fusion of spine, cervical region: Secondary | ICD-10-CM | POA: Diagnosis not present

## 2018-09-24 DIAGNOSIS — C7802 Secondary malignant neoplasm of left lung: Secondary | ICD-10-CM | POA: Diagnosis not present

## 2018-09-24 DIAGNOSIS — I10 Essential (primary) hypertension: Secondary | ICD-10-CM | POA: Diagnosis not present

## 2018-09-24 DIAGNOSIS — M8458XD Pathological fracture in neoplastic disease, other specified site, subsequent encounter for fracture with routine healing: Secondary | ICD-10-CM | POA: Diagnosis not present

## 2018-09-28 ENCOUNTER — Ambulatory Visit: Payer: Medicare Other

## 2018-09-29 ENCOUNTER — Telehealth: Payer: Self-pay | Admitting: Oncology

## 2018-09-29 NOTE — Telephone Encounter (Signed)
Shadad call day 11/21 moved appointments to 11/27. Not able to reach patient by phone or leave message. Schedule mailed.

## 2018-09-30 ENCOUNTER — Other Ambulatory Visit: Payer: Self-pay

## 2018-09-30 NOTE — Patient Outreach (Signed)
[facility]) Care Management  09/30/2018  TAHEERA THOMANN [birth year] [id number]   Medication Adherence call to Mrs. Milly Jakob patient did not answer patient is due on Atorvastatin 10 mg under [place].   [facility] Management Direct Dial [phone]  Fax [phone] [email or web address]

## 2018-10-01 ENCOUNTER — Telehealth: Payer: Self-pay | Admitting: *Deleted

## 2018-10-01 ENCOUNTER — Other Ambulatory Visit: Payer: Self-pay | Admitting: *Deleted

## 2018-10-01 MED ORDER — HYDROCODONE-ACETAMINOPHEN 5-325 MG PO TABS: 1.0000 | ORAL_TABLET | Freq: Four times a day (QID) | ORAL | 0 refills | Status: DC | PRN

## 2018-10-01 NOTE — Telephone Encounter (Signed)
Called P.T. Ester with [facility], who has been working with patient. LM for her, that dr Alen Blew would like for patient to continue with P.T. For gait training, strengthening, and ambulation. Gave her my direct # for phone and fax.

## 2018-10-01 NOTE — Telephone Encounter (Signed)
Daughter-in-law [name] calling to say patient is having more pain around right kidney area. Tramadol not helping. She is taking tramadol 1 every 6 hours. Denies pain on urination or hematuria. Also is c/o weakness in right leg and they would like to extend P.T. If possible.

## 2018-10-01 NOTE — Telephone Encounter (Signed)
Ok to extend PT.  Norco 5/325 q6 prn. #40.

## 2018-10-02 ENCOUNTER — Other Ambulatory Visit: Payer: Self-pay | Admitting: Internal Medicine

## 2018-10-02 DIAGNOSIS — M8580 Other specified disorders of bone density and structure, unspecified site: Secondary | ICD-10-CM

## 2018-10-07 ENCOUNTER — Other Ambulatory Visit: Payer: Self-pay

## 2018-10-07 ENCOUNTER — Telehealth: Payer: Self-pay

## 2018-10-07 DIAGNOSIS — C7951 Secondary malignant neoplasm of bone: Secondary | ICD-10-CM

## 2018-10-07 DIAGNOSIS — N2889 Other specified disorders of kidney and ureter: Secondary | ICD-10-CM

## 2018-10-07 DIAGNOSIS — R918 Other nonspecific abnormal finding of lung field: Secondary | ICD-10-CM

## 2018-10-07 NOTE — Telephone Encounter (Signed)
Received a call from the patient daughter in law [name] concerned that the patient has minimal energy, minimal appetite and forces herself to eat. Reviewed the side effects of the Cabometyx and she verbalized understanding and aware of next appointment with Dr. Alen Blew 11/27. She also stated that they have not heard from [facility] regarding PT services. Contacted Santiago Glad with [facility] and the previous PT order was closed and requires a new order. New order for PT was placed and Santiago Glad with [facility] made aware for follow up.

## 2018-10-08 ENCOUNTER — Other Ambulatory Visit: Payer: Self-pay | Admitting: Oncology

## 2018-10-08 DIAGNOSIS — C649 Malignant neoplasm of unspecified kidney, except renal pelvis: Secondary | ICD-10-CM

## 2018-10-09 MED FILL — CABOMETYX 20 MG TABLET: 20 | 30 days supply | Qty: 30 | Fill #0

## 2018-10-12 DIAGNOSIS — M542 Cervicalgia: Secondary | ICD-10-CM | POA: Diagnosis not present

## 2018-10-13 DIAGNOSIS — C7802 Secondary malignant neoplasm of left lung: Secondary | ICD-10-CM | POA: Diagnosis not present

## 2018-10-13 DIAGNOSIS — M8458XD Pathological fracture in neoplastic disease, other specified site, subsequent encounter for fracture with routine healing: Secondary | ICD-10-CM | POA: Diagnosis not present

## 2018-10-13 DIAGNOSIS — Z96653 Presence of artificial knee joint, bilateral: Secondary | ICD-10-CM | POA: Diagnosis not present

## 2018-10-13 DIAGNOSIS — C641 Malignant neoplasm of right kidney, except renal pelvis: Secondary | ICD-10-CM | POA: Diagnosis not present

## 2018-10-13 DIAGNOSIS — Z79891 Long term (current) use of opiate analgesic: Secondary | ICD-10-CM | POA: Diagnosis not present

## 2018-10-13 DIAGNOSIS — C7801 Secondary malignant neoplasm of right lung: Secondary | ICD-10-CM | POA: Diagnosis not present

## 2018-10-13 DIAGNOSIS — D440 Neoplasm of uncertain behavior of thyroid gland: Secondary | ICD-10-CM | POA: Diagnosis not present

## 2018-10-13 DIAGNOSIS — I10 Essential (primary) hypertension: Secondary | ICD-10-CM | POA: Diagnosis not present

## 2018-10-13 DIAGNOSIS — C7951 Secondary malignant neoplasm of bone: Secondary | ICD-10-CM | POA: Diagnosis not present

## 2018-10-13 DIAGNOSIS — G893 Neoplasm related pain (acute) (chronic): Secondary | ICD-10-CM | POA: Diagnosis not present

## 2018-10-13 DIAGNOSIS — M4322 Fusion of spine, cervical region: Secondary | ICD-10-CM | POA: Diagnosis not present

## 2018-10-13 DIAGNOSIS — K219 Gastro-esophageal reflux disease without esophagitis: Secondary | ICD-10-CM | POA: Diagnosis not present

## 2018-10-13 DIAGNOSIS — Z981 Arthrodesis status: Secondary | ICD-10-CM | POA: Diagnosis not present

## 2018-10-13 DIAGNOSIS — M4326 Fusion of spine, lumbar region: Secondary | ICD-10-CM | POA: Diagnosis not present

## 2018-10-15 ENCOUNTER — Other Ambulatory Visit: Payer: Medicare Other

## 2018-10-15 ENCOUNTER — Ambulatory Visit: Payer: Medicare Other | Admitting: Oncology

## 2018-10-16 DIAGNOSIS — K219 Gastro-esophageal reflux disease without esophagitis: Secondary | ICD-10-CM | POA: Diagnosis not present

## 2018-10-16 DIAGNOSIS — Z981 Arthrodesis status: Secondary | ICD-10-CM | POA: Diagnosis not present

## 2018-10-16 DIAGNOSIS — C641 Malignant neoplasm of right kidney, except renal pelvis: Secondary | ICD-10-CM | POA: Diagnosis not present

## 2018-10-16 DIAGNOSIS — Z79891 Long term (current) use of opiate analgesic: Secondary | ICD-10-CM | POA: Diagnosis not present

## 2018-10-16 DIAGNOSIS — M8458XD Pathological fracture in neoplastic disease, other specified site, subsequent encounter for fracture with routine healing: Secondary | ICD-10-CM | POA: Diagnosis not present

## 2018-10-16 DIAGNOSIS — M4326 Fusion of spine, lumbar region: Secondary | ICD-10-CM | POA: Diagnosis not present

## 2018-10-16 DIAGNOSIS — G893 Neoplasm related pain (acute) (chronic): Secondary | ICD-10-CM | POA: Diagnosis not present

## 2018-10-16 DIAGNOSIS — C7802 Secondary malignant neoplasm of left lung: Secondary | ICD-10-CM | POA: Diagnosis not present

## 2018-10-16 DIAGNOSIS — Z96653 Presence of artificial knee joint, bilateral: Secondary | ICD-10-CM | POA: Diagnosis not present

## 2018-10-16 DIAGNOSIS — D440 Neoplasm of uncertain behavior of thyroid gland: Secondary | ICD-10-CM | POA: Diagnosis not present

## 2018-10-16 DIAGNOSIS — I10 Essential (primary) hypertension: Secondary | ICD-10-CM | POA: Diagnosis not present

## 2018-10-16 DIAGNOSIS — M4322 Fusion of spine, cervical region: Secondary | ICD-10-CM | POA: Diagnosis not present

## 2018-10-16 DIAGNOSIS — C7951 Secondary malignant neoplasm of bone: Secondary | ICD-10-CM | POA: Diagnosis not present

## 2018-10-16 DIAGNOSIS — C7801 Secondary malignant neoplasm of right lung: Secondary | ICD-10-CM | POA: Diagnosis not present

## 2018-10-19 DIAGNOSIS — C7802 Secondary malignant neoplasm of left lung: Secondary | ICD-10-CM | POA: Diagnosis not present

## 2018-10-19 DIAGNOSIS — M4322 Fusion of spine, cervical region: Secondary | ICD-10-CM | POA: Diagnosis not present

## 2018-10-19 DIAGNOSIS — Z79891 Long term (current) use of opiate analgesic: Secondary | ICD-10-CM | POA: Diagnosis not present

## 2018-10-19 DIAGNOSIS — M8458XD Pathological fracture in neoplastic disease, other specified site, subsequent encounter for fracture with routine healing: Secondary | ICD-10-CM | POA: Diagnosis not present

## 2018-10-19 DIAGNOSIS — D440 Neoplasm of uncertain behavior of thyroid gland: Secondary | ICD-10-CM | POA: Diagnosis not present

## 2018-10-19 DIAGNOSIS — C7801 Secondary malignant neoplasm of right lung: Secondary | ICD-10-CM | POA: Diagnosis not present

## 2018-10-19 DIAGNOSIS — C641 Malignant neoplasm of right kidney, except renal pelvis: Secondary | ICD-10-CM | POA: Diagnosis not present

## 2018-10-19 DIAGNOSIS — G893 Neoplasm related pain (acute) (chronic): Secondary | ICD-10-CM | POA: Diagnosis not present

## 2018-10-19 DIAGNOSIS — Z981 Arthrodesis status: Secondary | ICD-10-CM | POA: Diagnosis not present

## 2018-10-19 DIAGNOSIS — M4326 Fusion of spine, lumbar region: Secondary | ICD-10-CM | POA: Diagnosis not present

## 2018-10-19 DIAGNOSIS — I10 Essential (primary) hypertension: Secondary | ICD-10-CM | POA: Diagnosis not present

## 2018-10-19 DIAGNOSIS — K219 Gastro-esophageal reflux disease without esophagitis: Secondary | ICD-10-CM | POA: Diagnosis not present

## 2018-10-19 DIAGNOSIS — Z96653 Presence of artificial knee joint, bilateral: Secondary | ICD-10-CM | POA: Diagnosis not present

## 2018-10-19 DIAGNOSIS — C7951 Secondary malignant neoplasm of bone: Secondary | ICD-10-CM | POA: Diagnosis not present

## 2018-10-21 ENCOUNTER — Inpatient Hospital Stay: Payer: Medicare Other | Attending: Oncology

## 2018-10-21 ENCOUNTER — Inpatient Hospital Stay (HOSPITAL_BASED_OUTPATIENT_CLINIC_OR_DEPARTMENT_OTHER): Payer: Medicare Other | Admitting: Oncology

## 2018-10-21 ENCOUNTER — Telehealth: Payer: Self-pay

## 2018-10-21 VITALS — BP 129/63 | HR 77 | Temp 97.7°F | Resp 17 | Ht 62.0 in | Wt 185.9 lb

## 2018-10-21 DIAGNOSIS — C7951 Secondary malignant neoplasm of bone: Secondary | ICD-10-CM | POA: Insufficient documentation

## 2018-10-21 DIAGNOSIS — C649 Malignant neoplasm of unspecified kidney, except renal pelvis: Secondary | ICD-10-CM

## 2018-10-21 DIAGNOSIS — Z79899 Other long term (current) drug therapy: Secondary | ICD-10-CM

## 2018-10-21 DIAGNOSIS — C641 Malignant neoplasm of right kidney, except renal pelvis: Secondary | ICD-10-CM | POA: Diagnosis not present

## 2018-10-21 DIAGNOSIS — Z923 Personal history of irradiation: Secondary | ICD-10-CM

## 2018-10-21 LAB — CBC WITH DIFFERENTIAL (CANCER CENTER ONLY)
ABS IMMATURE GRANULOCYTES: 0.02 10*3/uL (ref 0.00–0.07)
BASOS PCT: 0 %
Basophils Absolute: 0 10*3/uL (ref 0.0–0.1)
EOS PCT: 1 %
Eosinophils Absolute: 0 10*3/uL (ref 0.0–0.5)
HCT: 34.3 % — ABNORMAL LOW (ref 36.0–46.0)
HEMOGLOBIN: 10.8 g/dL — AB (ref 12.0–15.0)
Immature Granulocytes: 0 %
LYMPHS PCT: 40 %
Lymphs Abs: 2.2 10*3/uL (ref 0.7–4.0)
MCH: 29 pg (ref 26.0–34.0)
MCHC: 31.5 g/dL (ref 30.0–36.0)
MCV: 92.2 fL (ref 80.0–100.0)
MONOS PCT: 10 %
Monocytes Absolute: 0.5 10*3/uL (ref 0.1–1.0)
NEUTROS ABS: 2.8 10*3/uL (ref 1.7–7.7)
Neutrophils Relative %: 49 %
Platelet Count: 292 10*3/uL (ref 150–400)
RBC: 3.72 MIL/uL — ABNORMAL LOW (ref 3.87–5.11)
RDW: 14.8 % (ref 11.5–15.5)
WBC Count: 5.6 10*3/uL (ref 4.0–10.5)
nRBC: 0 % (ref 0.0–0.2)

## 2018-10-21 LAB — CMP (CANCER CENTER ONLY)
ALBUMIN: 2.2 g/dL — AB (ref 3.5–5.0)
ALT: 10 U/L (ref 0–44)
AST: 16 U/L (ref 15–41)
Alkaline Phosphatase: 133 U/L — ABNORMAL HIGH (ref 38–126)
Anion gap: 9 (ref 5–15)
BILIRUBIN TOTAL: 0.2 mg/dL — AB (ref 0.3–1.2)
BUN: 15 mg/dL (ref 8–23)
CO2: 27 mmol/L (ref 22–32)
CREATININE: 0.73 mg/dL (ref 0.44–1.00)
Calcium: 8.6 mg/dL — ABNORMAL LOW (ref 8.9–10.3)
Chloride: 106 mmol/L (ref 98–111)
GFR, Est AFR Am: >60 mL/min (ref >60)
GFR, Estimated: >60 mL/min (ref >60)
GLUCOSE: 98 mg/dL (ref 70–99)
POTASSIUM: 4.2 mmol/L (ref 3.5–5.1)
Sodium: 142 mmol/L (ref 135–145)
TOTAL PROTEIN: 5.9 g/dL — AB (ref 6.5–8.1)

## 2018-10-21 MED ORDER — FUROSEMIDE 20 MG PO TABS: 20.0000 mg | ORAL_TABLET | Freq: Every day | ORAL | 1 refills | Status: DC

## 2018-10-21 NOTE — Progress Notes (Signed)
Hematology and Oncology Follow Up Visit  Eileen Norris [id number] 1941-06-06 76 y.o. 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  She was found to have metastatic poorly differentiated carcinoma and right kidney mass.   Prior Therapy:  He is status post percutaneous biopsy of a right iliac metastatic lesion on 09/02/2018.  C7 stereotactic radiosurgery completed on 09/14/2018 under the care of Dr. Sherwood Gambler and Dr. Lisbeth Renshaw.  Current therapy: Cabometyx 20 mg started September 17, 2018.  Interim History: Eileen Norris returns today for repeat evaluation.  Since last visit, she was started on Cabometyx 20 mg without any major complications.  She does report some mild fatigue, tiredness but no other complaints.  Her appetite remained reasonable and eating well without any decline in her weight.  She continues to have lower extremity edema even after steroid been tapered.  She denies any worsening diarrhea or abdominal distention.  She denies any changes in the taste of food.  Her performance status is limited but unchanged.  She still ambulates with the help of a cane.  She does report intermittent flank pain which she takes hydrocodone about once a day.  This has been effective in controlling her pain some.  She does not report any headaches, blurry vision, syncope or seizures.  She denies any confusion or lethargy.  Does not report any fevers, chills or sweats.  Does not report any cough, wheezing or hemoptysis.  Does not report any chest pain, palpitation, orthopnea or leg edema.  Does not report any nausea, vomiting or early satiety.  Does not report any changes in bowel habits. Does not report any arthralgias or myalgias.   Does not report frequency, urgency or hematuria.  Does not report any skin rashes or lesions.  Does not report any bleeding or clotting  tendency.  Denies any changes in her mood.  Denies any heat or cold intolerance.  Remaining review of systems is negative.    Medications: I have reviewed the patient's current medications.  Current Outpatient Medications  Medication Sig Dispense Refill  . amLODipine (NORVASC) 10 MG tablet Take 1 tablet (10 mg total) by mouth daily. 30 tablet 1  . CABOMETYX 20 MG tablet TAKE 1 TABLET (20 MG TOTAL) BY MOUTH DAILY. TAKE ON AN EMPTY STOMACH, 1 HOUR BEFORE OR 2 HOURS AFTER MEALS. 30 tablet 0  . Cholecalciferol (VITAMIN D-3) 1000 units CAPS Take 1,000 Units by mouth daily.    Marland Kitchen docusate sodium (COLACE) 100 MG capsule Take 100 mg by mouth daily as needed for mild constipation.    . ferrous sulfate 325 (65 FE) MG EC tablet Take 325 mg by mouth daily with breakfast.    . gabapentin (NEURONTIN) 100 MG capsule Take 100 mg by mouth See admin instructions. Taking 2 capsules (200mg ) in the AM, then 1 capsule (100mg ) at noon, then 2 capsules (200mg ) at bedtime.    . hydrochlorothiazide (HYDRODIURIL) 50 MG tablet Take 50 mg by mouth daily.     Marland Kitchen HYDROcodone-acetaminophen (NORCO) 5-325 MG tablet Take 1 tablet by mouth every 6 (six) hours as needed for moderate pain. 40 tablet 0  . meloxicam (MOBIC) 7.5 MG tablet Take 7.5 mg by mouth 2 times daily at 12 noon and 4 pm.    . metoprolol (LOPRESSOR) 50 MG tablet Take 50 mg by mouth 2 (two) times daily.      Marland Kitchen  traMADol (ULTRAM) 50 MG tablet Take 1 tablet (50 mg total) by mouth every 6 (six) hours as needed. 30 tablet 1   No current facility-administered medications for this visit.      Allergies:  Allergies  Allergen Reactions  . Advil [Ibuprofen] Other (See Comments)    "Gives me the shakes"  . Prilosec [Omeprazole] Other (See Comments)    Constipation    Past Medical History, Surgical history, Social history, and Family History were reviewed and updated.  Review of Systems:    Physical Exam: Blood pressure 129/63, pulse 77, temperature 97.7 F  (36.5 C), temperature source Oral, resp. rate 17, height 5\' 2"  (1.575 m), weight 185 lb 14.4 oz (84.3 kg), SpO2 98 %.    ECOG:  2   General appearance: Comfortable appearing without any discomfort Head: Normocephalic without any trauma Oropharynx: Mucous membranes are moist and pink without any thrush or ulcers. Eyes: Pupils are equal and round reactive to light. Lymph nodes: No cervical, supraclavicular, inguinal or axillary lymphadenopathy.   Heart:regular rate and rhythm.  S1 and S2.  Bilateral lower extremity edema 2+ noted. Lung: Clear without any rhonchi or wheezes.  No dullness to percussion. Abdomin: Soft, nontender, nondistended with good bowel sounds.  No hepatosplenomegaly. Musculoskeletal: No joint deformity or effusion.  Full range of motion noted. Neurological: No deficits noted on motor, sensory and deep tendon reflex exam. Skin: No petechial rash or dryness.  Appeared moist.      Lab Results: Lab Results  Component Value Date   WBC 6.2 09/04/2018   HGB 10.0 (L) 09/04/2018   HCT 32.7 (L) 09/04/2018   MCV 92.6 09/04/2018   PLT 262 09/04/2018     Chemistry      Component Value Date/Time   NA 139 09/04/2018 0410   NA 144 11/30/2011 0418   K 4.0 09/04/2018 0410   K 3.6 11/30/2011 0418   CL 106 09/04/2018 0410   CL 105 11/30/2011 0418   CO2 25 09/04/2018 0410   CO2 29 11/30/2011 0418   BUN 41 (H) 09/04/2018 0410   BUN 19 (H) 11/30/2011 0418   CREATININE 1.13 (H) 09/04/2018 0410   CREATININE 0.98 11/30/2011 0418      Component Value Date/Time   CALCIUM 8.9 09/04/2018 0410   CALCIUM 8.6 11/30/2011 0418   ALKPHOS 101 08/30/2018 2047   ALKPHOS 70 11/28/2011 2152   AST 18 08/30/2018 2047   AST 25 11/28/2011 2152   ALT 10 08/30/2018 2047   ALT 23 11/28/2011 2152   BILITOT 0.4 08/30/2018 2047   BILITOT 0.3 11/28/2011 2152        Impression and Plan:  77 year old woman with the following:  1.  Stage IV renal cell carcinoma arising from the right  kidney primary with disease to the bone diagnosed October 2019.    She has started Cabometyx 20 mg started September 17, 2018.  Risks and benefits of continuing this therapy long-term and alternative options were reviewed again.  These options would include immunotherapy to be given intravenously versus different oral targeted therapy.  After discussion today, she is agreeable to continue and will repeat imaging studies in 1 month for staging purposes.   2.  Cervical spine metastasis: Completed radiation therapy without any delayed complications.  She continues to wear her neck brace under the care of Dr. Sherwood Gambler.  3.  Prognosis and goals of care: She has incurable malignancy and any treatment is palliative in nature.  Her performance status remains adequate and  anticancer therapy is warranted for the time being.  4.  Pain: He was prescribed hydrocodone with reasonable control of her pain.  5.  Lower extremity edema: She is currently on hydrochlorothiazide.  I have recommended switching her to Lasix for better diuresis.  Her edema is likely related to steroid therapy.  6.  Follow-up: We will be in 1 month for a repeat evaluation.  25  minutes was spent with the patient face-to-face today.  More than 50% of time was dedicated to discussing his disease status, treatment options and asking questions regarding future plan of care    Zola Button, MD 11/27/20199:04 AM

## 2018-10-21 NOTE — Telephone Encounter (Signed)
Printed avs and calender of upcoming appointment. Per 11/27 los 

## 2018-10-27 DIAGNOSIS — C7802 Secondary malignant neoplasm of left lung: Secondary | ICD-10-CM | POA: Diagnosis not present

## 2018-10-27 DIAGNOSIS — M8458XD Pathological fracture in neoplastic disease, other specified site, subsequent encounter for fracture with routine healing: Secondary | ICD-10-CM | POA: Diagnosis not present

## 2018-10-27 DIAGNOSIS — G893 Neoplasm related pain (acute) (chronic): Secondary | ICD-10-CM | POA: Diagnosis not present

## 2018-10-27 DIAGNOSIS — C7951 Secondary malignant neoplasm of bone: Secondary | ICD-10-CM | POA: Diagnosis not present

## 2018-10-27 DIAGNOSIS — Z79891 Long term (current) use of opiate analgesic: Secondary | ICD-10-CM | POA: Diagnosis not present

## 2018-10-27 DIAGNOSIS — Z981 Arthrodesis status: Secondary | ICD-10-CM | POA: Diagnosis not present

## 2018-10-27 DIAGNOSIS — Z96653 Presence of artificial knee joint, bilateral: Secondary | ICD-10-CM | POA: Diagnosis not present

## 2018-10-27 DIAGNOSIS — I10 Essential (primary) hypertension: Secondary | ICD-10-CM | POA: Diagnosis not present

## 2018-10-27 DIAGNOSIS — M4326 Fusion of spine, lumbar region: Secondary | ICD-10-CM | POA: Diagnosis not present

## 2018-10-27 DIAGNOSIS — C641 Malignant neoplasm of right kidney, except renal pelvis: Secondary | ICD-10-CM | POA: Diagnosis not present

## 2018-10-27 DIAGNOSIS — K219 Gastro-esophageal reflux disease without esophagitis: Secondary | ICD-10-CM | POA: Diagnosis not present

## 2018-10-27 DIAGNOSIS — D440 Neoplasm of uncertain behavior of thyroid gland: Secondary | ICD-10-CM | POA: Diagnosis not present

## 2018-10-27 DIAGNOSIS — M4322 Fusion of spine, cervical region: Secondary | ICD-10-CM | POA: Diagnosis not present

## 2018-10-27 DIAGNOSIS — C7801 Secondary malignant neoplasm of right lung: Secondary | ICD-10-CM | POA: Diagnosis not present

## 2018-11-02 ENCOUNTER — Other Ambulatory Visit: Payer: Self-pay | Admitting: Oncology

## 2018-11-02 DIAGNOSIS — C649 Malignant neoplasm of unspecified kidney, except renal pelvis: Secondary | ICD-10-CM

## 2018-11-03 ENCOUNTER — Telehealth: Payer: Self-pay | Admitting: Medical

## 2018-11-03 NOTE — Telephone Encounter (Signed)
Scheduled appt per 12/10 sch message - pt daughter in law aware of appt date and time

## 2018-11-04 ENCOUNTER — Inpatient Hospital Stay: Payer: Medicare Other | Attending: Oncology | Admitting: Medical

## 2018-11-04 ENCOUNTER — Other Ambulatory Visit: Payer: Self-pay | Admitting: Emergency Medicine

## 2018-11-04 ENCOUNTER — Telehealth: Payer: Self-pay

## 2018-11-04 ENCOUNTER — Inpatient Hospital Stay: Payer: Medicare Other

## 2018-11-04 VITALS — BP 142/53 | HR 71 | Temp 98.2°F | Resp 19 | Ht 62.0 in | Wt 190.5 lb

## 2018-11-04 DIAGNOSIS — C7800 Secondary malignant neoplasm of unspecified lung: Secondary | ICD-10-CM | POA: Insufficient documentation

## 2018-11-04 DIAGNOSIS — C649 Malignant neoplasm of unspecified kidney, except renal pelvis: Secondary | ICD-10-CM

## 2018-11-04 DIAGNOSIS — R630 Anorexia: Secondary | ICD-10-CM | POA: Diagnosis not present

## 2018-11-04 DIAGNOSIS — C7951 Secondary malignant neoplasm of bone: Secondary | ICD-10-CM | POA: Diagnosis not present

## 2018-11-04 DIAGNOSIS — E8809 Other disorders of plasma-protein metabolism, not elsewhere classified: Secondary | ICD-10-CM | POA: Diagnosis not present

## 2018-11-04 DIAGNOSIS — C641 Malignant neoplasm of right kidney, except renal pelvis: Secondary | ICD-10-CM | POA: Diagnosis not present

## 2018-11-04 DIAGNOSIS — R609 Edema, unspecified: Secondary | ICD-10-CM

## 2018-11-04 DIAGNOSIS — C469 Kaposi's sarcoma, unspecified: Secondary | ICD-10-CM | POA: Diagnosis not present

## 2018-11-04 DIAGNOSIS — R600 Localized edema: Secondary | ICD-10-CM | POA: Insufficient documentation

## 2018-11-04 LAB — CBC WITH DIFFERENTIAL (CANCER CENTER ONLY)
Abs Immature Granulocytes: 0.01 10*3/uL (ref 0.00–0.07)
BASOS ABS: 0 10*3/uL (ref 0.0–0.1)
BASOS PCT: 1 %
Eosinophils Absolute: 0.1 10*3/uL (ref 0.0–0.5)
Eosinophils Relative: 1 %
HCT: 32.7 % — ABNORMAL LOW (ref 36.0–46.0)
Hemoglobin: 10.1 g/dL — ABNORMAL LOW (ref 12.0–15.0)
Immature Granulocytes: 0 %
Lymphocytes Relative: 48 %
Lymphs Abs: 2.5 10*3/uL (ref 0.7–4.0)
MCH: 28.7 pg (ref 26.0–34.0)
MCHC: 30.9 g/dL (ref 30.0–36.0)
MCV: 92.9 fL (ref 80.0–100.0)
MONOS PCT: 9 %
Monocytes Absolute: 0.5 10*3/uL (ref 0.1–1.0)
NEUTROS ABS: 2.1 10*3/uL (ref 1.7–7.7)
Neutrophils Relative %: 41 %
PLATELETS: 342 10*3/uL (ref 150–400)
RBC: 3.52 MIL/uL — ABNORMAL LOW (ref 3.87–5.11)
RDW: 15 % (ref 11.5–15.5)
WBC Count: 5.2 10*3/uL (ref 4.0–10.5)
nRBC: 0 % (ref 0.0–0.2)

## 2018-11-04 LAB — CMP (CANCER CENTER ONLY)
ALT: 8 U/L (ref 0–44)
AST: 15 U/L (ref 15–41)
Albumin: 2.5 g/dL — ABNORMAL LOW (ref 3.5–5.0)
Alkaline Phosphatase: 116 U/L (ref 38–126)
Anion gap: 11 (ref 5–15)
BUN: 10 mg/dL (ref 8–23)
CO2: 26 mmol/L (ref 22–32)
Calcium: 9 mg/dL (ref 8.9–10.3)
Chloride: 105 mmol/L (ref 98–111)
Creatinine: 0.72 mg/dL (ref 0.44–1.00)
GFR, Est AFR Am: >60 mL/min (ref >60)
GFR, Estimated: >60 mL/min (ref >60)
GLUCOSE: 86 mg/dL (ref 70–99)
Potassium: 3.7 mmol/L (ref 3.5–5.1)
Sodium: 142 mmol/L (ref 135–145)
Total Bilirubin: 0.3 mg/dL (ref 0.3–1.2)
Total Protein: 6.3 g/dL — ABNORMAL LOW (ref 6.5–8.1)

## 2018-11-04 MED ORDER — MIRTAZAPINE 7.5 MG PO TABS: 7.5000 mg | ORAL_TABLET | Freq: Every day | ORAL | 2 refills | Status: DC

## 2018-11-04 MED ORDER — FUROSEMIDE 20 MG PO TABS: ORAL_TABLET | ORAL | 1 refills | Status: AC

## 2018-11-04 NOTE — Progress Notes (Signed)
Pt presents today with worsening bilat lower leg edema that impedes activity.  Denies CP, SOB, or trouble sleeping.  No rash or drainage present on legs.  States "as soon as I take my chemo pills in the morning they start swelling".  Reports some improvement with elevation.  +2 pitting edema that goes into thighs.  No pain or tenderness or redness present.

## 2018-11-04 NOTE — Patient Instructions (Signed)
Edema Edema is when you have too much fluid in your body or under your skin. Edema may make your legs, feet, and ankles swell up. Swelling is also common in looser tissues, like around your eyes. This is a common condition. It gets more common as you get older. There are many possible causes of edema. Eating too much salt (sodium) and being on your feet or sitting for a long time can cause edema in your legs, feet, and ankles. Hot weather may make edema worse. Edema is usually painless. Your skin may look swollen or shiny. Follow these instructions at home:  Keep the swollen body part raised (elevated) above the level of your heart when you are sitting or lying down.  Do not sit still or stand for a long time.  Do not wear tight clothes. Do not wear garters on your upper legs.  Exercise your legs. This can help the swelling go down.  Wear elastic bandages or support stockings as told by your doctor.  Eat a low-salt (low-sodium) diet to reduce fluid as told by your doctor.  Depending on the cause of your swelling, you may need to limit how much fluid you drink (fluid restriction).  Take over-the-counter and prescription medicines only as told by your doctor. Contact a doctor if:  Treatment is not working.  You have heart, liver, or kidney disease and have symptoms of edema.  You have sudden and unexplained weight gain. Get help right away if:  You have shortness of breath or chest pain.  You cannot breathe when you lie down.  You have pain, redness, or warmth in the swollen areas.  You have heart, liver, or kidney disease and get edema all of a sudden.  You have a fever and your symptoms get worse all of a sudden. Summary  Edema is when you have too much fluid in your body or under your skin.  Edema may make your legs, feet, and ankles swell up. Swelling is also common in looser tissues, like around your eyes.  Raise (elevate) the swollen body part above the level of your  heart when you are sitting or lying down.  Follow your doctor's instructions about diet and how much fluid you can drink (fluid restriction). This information is not intended to replace advice given to you by your health care provider. Make sure you discuss any questions you have with your health care provider. Document Released: 04/29/2008 Document Revised: 11/29/2016 Document Reviewed: 11/29/2016 Elsevier Interactive Patient Education  2017 Elsevier Inc.  

## 2018-11-04 NOTE — Telephone Encounter (Signed)
Received call from patient daughter in law, Eileen Norris, requesting refill on amlodipine. She also stated that the patient's bilateral lower extremities are swollen and tight and the the patient does not want to walk due to increased pain with mobility from the edema. Also reported that the patient is having increased SOB with activity. Gardiner Rhyme to contact PCP for refill on cardiac medications and offered appointment with symptom management clinic. After speaking with Dr. Alen Blew communicated that he will not refill the amlodipine as that is contributing to the edema. Scheduling message sent for lab appointment and symptom management clinic appointment. Eileen Norris had no other questions or concerns.

## 2018-11-05 ENCOUNTER — Telehealth: Payer: Self-pay | Admitting: Oncology

## 2018-11-05 DIAGNOSIS — C7951 Secondary malignant neoplasm of bone: Secondary | ICD-10-CM | POA: Diagnosis not present

## 2018-11-05 DIAGNOSIS — C641 Malignant neoplasm of right kidney, except renal pelvis: Secondary | ICD-10-CM | POA: Diagnosis not present

## 2018-11-05 DIAGNOSIS — M8458XD Pathological fracture in neoplastic disease, other specified site, subsequent encounter for fracture with routine healing: Secondary | ICD-10-CM | POA: Diagnosis not present

## 2018-11-05 DIAGNOSIS — K219 Gastro-esophageal reflux disease without esophagitis: Secondary | ICD-10-CM | POA: Diagnosis not present

## 2018-11-05 DIAGNOSIS — M4326 Fusion of spine, lumbar region: Secondary | ICD-10-CM | POA: Diagnosis not present

## 2018-11-05 DIAGNOSIS — I10 Essential (primary) hypertension: Secondary | ICD-10-CM | POA: Diagnosis not present

## 2018-11-05 DIAGNOSIS — G893 Neoplasm related pain (acute) (chronic): Secondary | ICD-10-CM | POA: Diagnosis not present

## 2018-11-05 DIAGNOSIS — Z981 Arthrodesis status: Secondary | ICD-10-CM | POA: Diagnosis not present

## 2018-11-05 DIAGNOSIS — D440 Neoplasm of uncertain behavior of thyroid gland: Secondary | ICD-10-CM | POA: Diagnosis not present

## 2018-11-05 DIAGNOSIS — C7801 Secondary malignant neoplasm of right lung: Secondary | ICD-10-CM | POA: Diagnosis not present

## 2018-11-05 DIAGNOSIS — Z79891 Long term (current) use of opiate analgesic: Secondary | ICD-10-CM | POA: Diagnosis not present

## 2018-11-05 DIAGNOSIS — C7802 Secondary malignant neoplasm of left lung: Secondary | ICD-10-CM | POA: Diagnosis not present

## 2018-11-05 DIAGNOSIS — M4322 Fusion of spine, cervical region: Secondary | ICD-10-CM | POA: Diagnosis not present

## 2018-11-05 DIAGNOSIS — Z96653 Presence of artificial knee joint, bilateral: Secondary | ICD-10-CM | POA: Diagnosis not present

## 2018-11-05 NOTE — Telephone Encounter (Signed)
Scheduled appt per 12/12 sch message - pt daughter is aware of appt date and time

## 2018-11-05 NOTE — Progress Notes (Signed)
Symptoms Management Clinic Progress Note   Eileen Norris [id number] 09/23/1941 77 y.o.  Eileen Norris is managed by Dr. Zola Button  Actively treated with chemotherapy/immunotherapy/hormonal therapy: yes  Current Therapy: Cabometyx  Last Treated: n/a  Assessment: Plan:    Anorexia - Plan: mirtazapine (REMERON) 7.5 MG tablet, Ambulatory referral to Nutrition and Diabetic E  Edema due to hypoalbuminemia - Plan: furosemide (LASIX) 20 MG tablet, Ambulatory referral to Nutrition and Diabetic E, Ambulatory referral to Physical Therapy  Renal cell carcinoma, unspecified laterality (HCC)   1) Anorexia: The pt was prescribed remeron 7.5 mg PO nightly at bedtime.  Also a referral was made to nutrition services.  2) Edema d/t hypoalbuminemia: A referral was made to nutrition and to the lymphedema clinic.  She was advised to increased her Lasix dose of 20 mg PO once daily to twice daily for three days, then to return to original dosing regimen.  3) Metastatic renal cell carcinoma: Pt continues on cabometyx under the direction of Dr. Alen Blew.  She will see Dr. Alen Blew for follow up on 11/23/18.  Please see After Visit Summary for patient specific instructions.  Future Appointments  Date Time Provider [place]  11/20/2018  8:15 AM [facility] LAB 4 [facility] None  11/20/2018 10:30 AM [facility] 1 [facility] [place]  11/23/2018  9:30 AM Shadad, Mathis Dad, MD [facility] None    Orders Placed This Encounter  Procedures  . Ambulatory referral to Nutrition and Diabetic E  . Ambulatory referral to Physical Therapy       Subjective:   Patient ID:  Eileen Norris is a 77 y.o. (DOB 02-14-1941) female.  Chief Complaint:  Chief Complaint  Patient presents with  . Leg Swelling    HPI [name] is a 77 y.o. female with a metastatic renal cell carcinoma with metastatic disease to the bone.  She presents with her son and daughter-in-law for continued bilateral lower extremity edema, +1  pitting.  This is so severe that it impacts her ADLs.  Pt denies N/V/D, fever, chills, or CP/SOB.  She has been taking Lasix 20 mg daily without significant benefit.  She has anorexia.  Her labs today show a lower albumin at 2.5 mg.  She continues on cabometyx and will see Dr. Alen Blew for follow up on 11/23/18.  Medications: I have reviewed the patient's current medications.  Allergies:  Allergies  Allergen Reactions  . Advil [Ibuprofen] Other (See Comments)    "Gives me the shakes"  . Prilosec [Omeprazole] Other (See Comments)    Constipation    Past Medical History:  Diagnosis Date  . Arthritis    Neck, knees, hands  . Bone metastases ([place]) 09/01/2018  . Dysrhythmia    "beating fast & flooding"  . GERD (gastroesophageal reflux disease)   . Hypertension     Past Surgical History:  Procedure Laterality Date  . ABDOMINAL HYSTERECTOMY    . ANTERIOR CERVICAL DECOMP/DISCECTOMY FUSION  12/02/2011   Procedure: ANTERIOR CERVICAL DECOMPRESSION/DISCECTOMY FUSION 1 LEVEL;  Surgeon: Hosie Spangle;  Location: [facility];  Service: Neurosurgery;  Laterality: N/A;  Cervical three - four Anterior Cervical Decompression Fusion  . JOINT REPLACEMENT     bilateral knees,     Family History  Problem Relation Age of Onset  . Hypertension Mother     Social History   Socioeconomic History  . Marital status: Single    Spouse name: Not on file  . Number of children: Not on file  .  Years of education: Not on file  . Highest education level: Not on file  Occupational History  . Not on file  Social Needs  . Financial resource strain: Not on file  . Food insecurity:    Worry: Not on file    Inability: Not on file  . Transportation needs:    Medical: Not on file    Non-medical: Not on file  Tobacco Use  . Smoking status: Never Smoker  . Smokeless tobacco: Never Used  Substance and Sexual Activity  . Alcohol use: No  . Drug use: No  . Sexual activity: Never  Lifestyle  . Physical  activity:    Days per week: Not on file    Minutes per session: Not on file  . Stress: Not on file  Relationships  . Social connections:    Talks on phone: Not on file    Gets together: Not on file    Attends religious service: Not on file    Active member of club or organization: Not on file    Attends meetings of clubs or organizations: Not on file    Relationship status: Not on file  . Intimate partner violence:    Fear of current or ex partner: Not on file    Emotionally abused: Not on file    Physically abused: Not on file    Forced sexual activity: Not on file  Other Topics Concern  . Not on file  Social History Narrative  . Not on file    Past Medical History, Surgical history, Social history, and Family history were reviewed and updated as appropriate.   Please see review of systems for further details on the patient's review from today.   Review of Systems:  Review of Systems  Constitutional: Positive for appetite change. Negative for chills, diaphoresis, fever and unexpected weight change.  HENT: Negative for trouble swallowing.   Respiratory: Negative for cough, chest tightness and shortness of breath.   Cardiovascular: Positive for leg swelling. Negative for chest pain and palpitations.  Gastrointestinal: Negative for abdominal pain, constipation, diarrhea, nausea and vomiting.  Neurological: Negative for dizziness and headaches.    Objective:   Physical Exam:  BP (!) 142/53 (BP Location: Left Arm, Patient Position: Sitting)   Pulse 71   Temp 98.2 F (36.8 C) (Oral)   Resp 19   Ht 5\' 2"  (1.575 m)   Wt 190 lb 8 oz (86.4 kg)   SpO2 100%   BMI 34.84 kg/m  ECOG: 1  Physical Exam Constitutional:      General: She is not in acute distress.    Appearance: She is not diaphoretic.  HENT:     Head: Normocephalic and atraumatic.  Cardiovascular:     Rate and Rhythm: Normal rate and regular rhythm.     Heart sounds: Normal heart sounds. No murmur. No  friction rub. No gallop.      Comments: No presacral edema noted. Pulmonary:     Effort: Pulmonary effort is normal. No respiratory distress.     Breath sounds: Normal breath sounds. No wheezing or rales.  Musculoskeletal:     Right lower leg: Edema present.     Left lower leg: Edema present.     Comments: 1+ pitting edema bilaterally.  Skin:    General: Skin is warm and dry.     Findings: No erythema or rash.  Neurological:     Mental Status: She is alert.     Gait: Gait abnormal (The patient  is ambulating with the use of a wheelchair.).     Lab Review:     Component Value Date/Time   NA 142 11/04/2018 1018   NA 144 11/30/2011 0418   K 3.7 11/04/2018 1018   K 3.6 11/30/2011 0418   CL 105 11/04/2018 1018   CL 105 11/30/2011 0418   CO2 26 11/04/2018 1018   CO2 29 11/30/2011 0418   GLUCOSE 86 11/04/2018 1018   GLUCOSE 102 (H) 11/30/2011 0418   BUN 10 11/04/2018 1018   BUN 19 (H) 11/30/2011 0418   CREATININE 0.72 11/04/2018 1018   CREATININE 0.98 11/30/2011 0418   CALCIUM 9.0 11/04/2018 1018   CALCIUM 8.6 11/30/2011 0418   PROT 6.3 (L) 11/04/2018 1018   PROT 6.9 11/28/2011 2152   ALBUMIN 2.5 (L) 11/04/2018 1018   ALBUMIN 3.4 11/28/2011 2152   AST 15 11/04/2018 1018   ALT 8 11/04/2018 1018   ALT 23 11/28/2011 2152   ALKPHOS 116 11/04/2018 1018   ALKPHOS 70 11/28/2011 2152   BILITOT 0.3 11/04/2018 1018   GFRNONAA >60 11/04/2018 1018   GFRNONAA 59 (L) 11/30/2011 0418   GFRAA >60 11/04/2018 1018   GFRAA >60 11/30/2011 0418       Component Value Date/Time   WBC 5.2 11/04/2018 1018   WBC 6.2 09/04/2018 0410   RBC 3.52 (L) 11/04/2018 1018   HGB 10.1 (L) 11/04/2018 1018   HGB 12.8 12/18/2012 1152   HCT 32.7 (L) 11/04/2018 1018   HCT 38.3 12/18/2012 1152   PLT 342 11/04/2018 1018   PLT 190 12/18/2012 1152   MCV 92.9 11/04/2018 1018   MCV 91 12/18/2012 1152   MCH 28.7 11/04/2018 1018   MCHC 30.9 11/04/2018 1018   RDW 15.0 11/04/2018 1018   RDW 14.1 12/18/2012  1152   LYMPHSABS 2.5 11/04/2018 1018   LYMPHSABS 2.7 11/30/2011 0418   MONOABS 0.5 11/04/2018 1018   MONOABS 0.5 11/30/2011 0418   EOSABS 0.1 11/04/2018 1018   EOSABS 0.1 11/30/2011 0418   BASOSABS 0.0 11/04/2018 1018   BASOSABS 0.0 11/30/2011 0418   -------------------------------  Imaging from last 24 hours (if applicable):  Radiology interpretation: No results found.

## 2018-11-08 ENCOUNTER — Other Ambulatory Visit: Payer: Self-pay

## 2018-11-08 ENCOUNTER — Encounter (HOSPITAL_COMMUNITY): Payer: Self-pay | Admitting: Emergency Medicine

## 2018-11-08 ENCOUNTER — Emergency Department (HOSPITAL_COMMUNITY)
Admission: EM | Admit: 2018-11-08 | Discharge: 2018-11-09 | Disposition: A | Discharge status: Home / Self Care | Payer: Medicare Other | Attending: Emergency Medicine | Admitting: Emergency Medicine

## 2018-11-08 DIAGNOSIS — C7951 Secondary malignant neoplasm of bone: Secondary | ICD-10-CM | POA: Diagnosis not present

## 2018-11-08 DIAGNOSIS — Z79899 Other long term (current) drug therapy: Secondary | ICD-10-CM | POA: Diagnosis not present

## 2018-11-08 DIAGNOSIS — C649 Malignant neoplasm of unspecified kidney, except renal pelvis: Secondary | ICD-10-CM | POA: Diagnosis not present

## 2018-11-08 DIAGNOSIS — I10 Essential (primary) hypertension: Secondary | ICD-10-CM | POA: Insufficient documentation

## 2018-11-08 DIAGNOSIS — Z96653 Presence of artificial knee joint, bilateral: Secondary | ICD-10-CM | POA: Diagnosis not present

## 2018-11-08 DIAGNOSIS — R627 Adult failure to thrive: Secondary | ICD-10-CM

## 2018-11-08 DIAGNOSIS — R2243 Localized swelling, mass and lump, lower limb, bilateral: Secondary | ICD-10-CM | POA: Diagnosis present

## 2018-11-08 DIAGNOSIS — R600 Localized edema: Secondary | ICD-10-CM | POA: Insufficient documentation

## 2018-11-08 DIAGNOSIS — R609 Edema, unspecified: Secondary | ICD-10-CM

## 2018-11-08 NOTE — ED Triage Notes (Addendum)
Pt reports incr'd fluid retention with notable swelling to BLEs, weakness to same causing 3 recent falls. PCP recently incr'd her lasix to 40mg /day, though not reporting incr'd output that would be expected. Denies SOB, CP. Hx renal cancer.

## 2018-11-09 ENCOUNTER — Emergency Department (HOSPITAL_BASED_OUTPATIENT_CLINIC_OR_DEPARTMENT_OTHER): Payer: Medicare Other

## 2018-11-09 ENCOUNTER — Encounter (HOSPITAL_COMMUNITY): Payer: Self-pay | Admitting: Primary Care

## 2018-11-09 ENCOUNTER — Telehealth: Payer: Self-pay

## 2018-11-09 DIAGNOSIS — M7989 Other specified soft tissue disorders: Secondary | ICD-10-CM | POA: Diagnosis not present

## 2018-11-09 DIAGNOSIS — R609 Edema, unspecified: Secondary | ICD-10-CM | POA: Diagnosis not present

## 2018-11-09 LAB — URINALYSIS, ROUTINE W REFLEX MICROSCOPIC
Bilirubin Urine: NEGATIVE
Glucose, UA: NEGATIVE mg/dL
Hgb urine dipstick: NEGATIVE
Ketones, ur: NEGATIVE mg/dL
Leukocytes, UA: NEGATIVE
Nitrite: NEGATIVE
Protein, ur: NEGATIVE mg/dL
Specific Gravity, Urine: 1.009 (ref 1.005–1.030)
pH: 6 (ref 5.0–8.0)

## 2018-11-09 LAB — CBC WITH DIFFERENTIAL/PLATELET
ABS IMMATURE GRANULOCYTES: 0.01 10*3/uL (ref 0.00–0.07)
Basophils Absolute: 0 10*3/uL (ref 0.0–0.1)
Basophils Relative: 0 %
Eosinophils Absolute: 0.1 10*3/uL (ref 0.0–0.5)
Eosinophils Relative: 1 %
HCT: 31.2 % — ABNORMAL LOW (ref 36.0–46.0)
Hemoglobin: 9.5 g/dL — ABNORMAL LOW (ref 12.0–15.0)
Immature Granulocytes: 0 %
Lymphocytes Relative: 43 %
Lymphs Abs: 2.4 10*3/uL (ref 0.7–4.0)
MCH: 28.4 pg (ref 26.0–34.0)
MCHC: 30.4 g/dL (ref 30.0–36.0)
MCV: 93.4 fL (ref 80.0–100.0)
Monocytes Absolute: 0.6 10*3/uL (ref 0.1–1.0)
Monocytes Relative: 10 %
Neutro Abs: 2.6 10*3/uL (ref 1.7–7.7)
Neutrophils Relative %: 46 %
Platelets: 357 10*3/uL (ref 150–400)
RBC: 3.34 MIL/uL — AB (ref 3.87–5.11)
RDW: 15.4 % (ref 11.5–15.5)
WBC: 5.7 10*3/uL (ref 4.0–10.5)
nRBC: 0 % (ref 0.0–0.2)

## 2018-11-09 LAB — COMPREHENSIVE METABOLIC PANEL
ALT: 14 U/L (ref 0–44)
AST: 22 U/L (ref 15–41)
Albumin: 2.4 g/dL — ABNORMAL LOW (ref 3.5–5.0)
Alkaline Phosphatase: 90 U/L (ref 38–126)
Anion gap: 10 (ref 5–15)
BUN: 9 mg/dL (ref 8–23)
CO2: 28 mmol/L (ref 22–32)
Calcium: 8.6 mg/dL — ABNORMAL LOW (ref 8.9–10.3)
Chloride: 103 mmol/L (ref 98–111)
Creatinine, Ser: 0.81 mg/dL (ref 0.44–1.00)
GFR calc Af Amer: >60 mL/min (ref >60)
GFR calc non Af Amer: >60 mL/min (ref >60)
Glucose, Bld: 118 mg/dL — ABNORMAL HIGH (ref 70–99)
Potassium: 3.2 mmol/L — ABNORMAL LOW (ref 3.5–5.1)
Sodium: 141 mmol/L (ref 135–145)
Total Bilirubin: 0.4 mg/dL (ref 0.3–1.2)
Total Protein: 5.8 g/dL — ABNORMAL LOW (ref 6.5–8.1)

## 2018-11-09 LAB — BRAIN NATRIURETIC PEPTIDE: B Natriuretic Peptide: 75.3 pg/mL (ref 0.0–100.0)

## 2018-11-09 MED ORDER — POTASSIUM CHLORIDE CRYS ER 20 MEQ PO TBCR
40.0000 meq | EXTENDED_RELEASE_TABLET | Freq: Once | ORAL | Status: AC
  Administered 2018-11-09: 40 meq via ORAL
  Filled 2018-11-09: qty 2

## 2018-11-09 NOTE — Progress Notes (Signed)
The CHF medical condition requires: Patient requires the ability to reposition frequently   Bed type Semi-electric

## 2018-11-09 NOTE — Progress Notes (Signed)
*  Preliminary Results* Bilateral lower extremity venous duplex completed. Bilateral lower extremities are negative for deep vein thrombosis. There is no evidence of Baker's cyst bilaterally.  11/09/2018 10:32 AM  Jinny Blossom Dawna Part

## 2018-11-09 NOTE — Plan of Care (Signed)
77 year old female with known history of stage IV renal cell carcinoma with cervical metastasis on cervical collar presents with increasing lower extremity edema.  Patient has been recently placed on Lasix despite which patient swelling has not decreased.  Patient denies any chest pain or shortness of breath.  Was asked by ER physician to give further advice.  At the time of my exam patient is not in distress and has no focal deficits.  Denies any shortness of breath chest pain.  On exam patient not wheezing or crepitations.  Able to move all extremities.  Does have bilateral lower extremity edema.  Labs reviewed.  Advised ER physician to make sure there is no DVT and if negative place patient on stockings for the lower extremity edema and may go up on the Lasix if patient has close follow-up with primary care or oncology for the metabolic panel.  Also get physical therapy evaluation prior to discharge.  Gean Birchwood

## 2018-11-09 NOTE — Discharge Planning (Signed)
[facility] notes PT evaluation.  Pt will go home with son, Merry Proud.  Address: [address] [place], [place] [address]

## 2018-11-09 NOTE — ED Provider Notes (Signed)
Patient has been assessed by case management and PT.  PT did find the patient is not very steady on her feet and needs assistance.  Family does not want her to go to a nursing facility and she is can go home and stay with family.  Home health services and a hospital bed have been ordered.  She did have a Doppler ultrasound which was negative for DVT.  Was discharged home with follow-up with her PCP.   Malvin Johns, MD 11/09/18 1258

## 2018-11-09 NOTE — ED Notes (Signed)
Pt and family voice understanding of discharge instructions. Patient departing with family.

## 2018-11-09 NOTE — ED Notes (Signed)
Son and Daughter in [place] and [place]: [phone].

## 2018-11-09 NOTE — Discharge Planning (Signed)
   Home health agencies that serve [phone]. Your favorite home health agencies  [place] of Patient Care Rating [facility] [place]  [phone] 3 out of 5 stars 5 out of [place]  [phone] 4  out of 5 stars 4 out of [place]  [phone] 4  out of 5 stars 3 out of 5 stars  [place]  [phone] 4  out of 5 stars 4 out of [place]  [phone] 4 out of 5 stars 4 out of [place]  [phone] 3 out of 5 stars 4 out of 5 stars  [facility] [place]  [phone] 4 out of 5 stars 4 out of [place]  [phone] 3 out of 5 stars 4 out of [place]  [phone] 3  out of 5 stars 5 out of [place]  [phone] 3 out of 5 stars 4 out of [place]  [phone] 3  out of 5 stars 4 out of [place]  [phone] 5 out of 5 stars 3 out of [place]  [phone]

## 2018-11-09 NOTE — Progress Notes (Signed)
PT Note  Patient suffers from severe lower extremity edema which impairs their ability to perform daily activities like ambulation in the home.  A walker alone will not resolve the issues with performing activities of daily living. A wheelchair will allow patient to safely perform daily activities.  The patient can self propel in the home or has a caregiver who can provide assistance.  [place] Pager [phone] Office [phone]

## 2018-11-09 NOTE — Evaluation (Signed)
Physical Therapy Evaluation Patient Details Name: Eileen Norris MRN: [id number] DOB: May 01, 1941 Today's Date: 11/09/2018   History of Present Illness  Pt presents to the ED with bilateral LE edema. Pt with recent falls at home. 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  Clinical Impression  Pt presents to PT requiring assistance for mobility due to severe edema of LE's. Pt lives alone an has multiple recent falls. Pt and family do not want pt to go to SNF. Pt can go to son's house. Recommend pt go to son's home with HHPT. Also recommend 3-in-1 and w/c for home.     Follow Up Recommendations Home health PT;Supervision for mobility/OOB    Equipment Recommendations  3in1 (PT);Wheelchair (measurements PT)    Recommendations for Other Services       Precautions / Restrictions Precautions Precautions: Fall;Cervical Required Braces or Orthoses: Cervical Brace Cervical Brace: Hard collar;At all times Restrictions Weight Bearing Restrictions: No      Mobility  Bed Mobility Overal bed mobility: Needs Assistance Bed Mobility: Supine to Sit;Sit to Supine     Supine to sit: Min assist Sit to supine: Min assist   General bed mobility comments: Assist to move BLE's off and on to the bed  Transfers Overall transfer level: Needs assistance Equipment used: 4-wheeled walker Transfers: Sit to/from Stand Sit to Stand: Min assist         General transfer comment: Assist to bring hips up  Ambulation/Gait Ambulation/Gait assistance: Min guard Gait Distance (Feet): 40 Feet Assistive device: 4-wheeled walker Gait Pattern/deviations: Step-through pattern;Decreased step length - right;Decreased step length - left;Wide base of support Gait velocity: decr Gait velocity interpretation: <1.31 ft/sec, indicative of household ambulator General Gait Details: Labored gait due to increased effort to advance legs due  to edema. Decr clearance of feet.  Stairs            Wheelchair Mobility    Modified Rankin (Stroke Patients Only)       Balance Overall balance assessment: Needs assistance Sitting-balance support: No upper extremity supported;Feet supported Sitting balance-Leahy Scale: Good     Standing balance support: No upper extremity supported Standing balance-Leahy Scale: Fair                               Pertinent Vitals/Pain Pain Assessment: No/denies pain    Home Living Family/patient expects to be discharged to:: Private residence Living Arrangements: Alone Available Help at Discharge: Family Type of Home: House Home Access: Stairs to enter   [employer] of Steps: 2-3 Home Layout: One level Home Equipment: Environmental consultant - 4 wheels;Cane - single point Additional Comments: Family states she can stay with them    Prior Function Level of Independence: Independent with assistive device(s)         Comments: Recent falls at home due to edema     Hand Dominance   Dominant Hand: Right    Extremity/Trunk Assessment   Upper Extremity Assessment Upper Extremity Assessment: Overall WFL for tasks assessed    Lower Extremity Assessment Lower Extremity Assessment: Generalized weakness;RLE deficits/detail;LLE deficits/detail RLE Deficits / Details: significant edema impairing strength and ROM LLE Deficits / Details: significant edema impairing strength and ROM       Communication   Communication: No difficulties  Cognition Arousal/Alertness: Awake/alert Behavior During Therapy: Flat affect Overall Cognitive Status: Within Functional Limits for tasks  assessed                                        General Comments      Exercises     Assessment/Plan    PT Assessment All further PT needs can be met in the next venue of care  PT Problem List Decreased strength;Decreased range of motion;Decreased activity tolerance;Decreased  balance;Decreased mobility       PT Treatment Interventions      PT Goals (Current goals can be found in the Care Plan section)  Acute Rehab PT Goals PT Goal Formulation: All assessment and education complete, DC therapy    Frequency     Barriers to discharge Inaccessible home environment;Decreased caregiver support stairs to enter. lives alone but can stay with family    Co-evaluation               AM-PAC PT "6 Clicks" Mobility  Outcome Measure Help needed turning from your back to your side while in a flat bed without using bedrails?: A Little Help needed moving from lying on your back to sitting on the side of a flat bed without using bedrails?: A Little Help needed moving to and from a bed to a chair (including a wheelchair)?: A Little Help needed standing up from a chair using your arms (e.g., wheelchair or bedside chair)?: A Little Help needed to walk in hospital room?: A Little Help needed climbing 3-5 steps with a railing? : A Little 6 Click Score: 18    End of Session Equipment Utilized During Treatment: Gait belt Activity Tolerance: Patient limited by fatigue Patient left: in bed;with family/visitor present(Pt in hallway of ED) Nurse Communication: Mobility status PT Visit Diagnosis: Other abnormalities of gait and mobility (R26.89);History of falling (Z91.81)    Time: 4098-1191 PT Time Calculation (min) (ACUTE ONLY): 21 min   Charges:   PT Evaluation $PT Eval Low Complexity: [place] Pager [phone] Office [place] 11/09/2018, 12:44 PM

## 2018-11-09 NOTE — Discharge Planning (Signed)
[facility] consulted regarding [place] vs SNF placement for pt.  EDCM spoke with pt and family at bedside; family would like to see what the recommendations of PT will be before making a decision.

## 2018-11-09 NOTE — Telephone Encounter (Signed)
Oral Oncology Patient Advocate Encounter  I put in a re-authorization for [employer] to [employer] and they came back stating that a PA was already approved 11/25/18-11/25/19.   The Key that I used in covermymeds is [place] Patient [place] Phone [phone] Fax [phone]

## 2018-11-09 NOTE — ED Provider Notes (Addendum)
[place] EMERGENCY DEPARTMENT Provider Note   CSN: [id number] Arrival date & time: 11/08/18  2020     History   Chief Complaint No chief complaint on file.   HPI MAIYAH GOYNE is a 77 y.o. female.  HPI  77 year old female comes in with chief complaint of leg swelling.  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  Patient also has hypertension.  She has no known history of CHF or venous edema.  Patient is here with her family and they report that patient swelling has gotten worse over the past several days despite being started on Lasix, and then Lasix being increased over the past 3 days.  Patient is now having inability to ambulate and she has had 2 falls because of her leg swelling.  Patient typically lives independently.  Past Medical History:  Diagnosis Date  . Arthritis    Neck, knees, hands  . Bone metastases ([place]) 09/01/2018  . Dysrhythmia    "beating fast & flooding"  . GERD (gastroesophageal reflux disease)   . Hypertension     Patient Active Problem List   Diagnosis Date Noted  . Right renal mass 09/01/2018  . Pulmonary nodules 09/01/2018  . Bone metastases ([place]) 09/01/2018  . Pathologic fracture of cervical vertebra, initial encounter 08/31/2018  . HTN (hypertension) 08/31/2018  . Lumbar stenosis with neurogenic claudication 04/01/2016  . 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 ABDOMINAL HYSTERECTOMY    . ANTERIOR CERVICAL DECOMP/DISCECTOMY FUSION  12/02/2011   Procedure: ANTERIOR CERVICAL DECOMPRESSION/DISCECTOMY FUSION 1 LEVEL;  Surgeon: Hosie Spangle;  Location: [facility];  Service: Neurosurgery;  Laterality: N/A;  Cervical three - four Anterior Cervical Decompression Fusion  . JOINT REPLACEMENT     bilateral knees,      OB History   No obstetric history on file.      Home Medications    Prior to Admission  medications   Medication Sig Start Date End Date Taking? Authorizing Provider  amLODipine (NORVASC) 10 MG tablet Take 1 tablet (10 mg total) by mouth daily. 09/04/18  Yes Purohit, Konrad Dolores, MD  CABOMETYX 20 MG tablet TAKE 1 TABLET (20 MG TOTAL) BY MOUTH DAILY. TAKE ON AN EMPTY STOMACH, 1 HOUR BEFORE OR 2 HOURS AFTER MEALS. Patient taking differently: Take 20 mg by mouth daily.  11/03/18  Yes Wyatt Portela, MD  Cholecalciferol (VITAMIN D-3) 1000 units CAPS Take 1,000 Units by mouth daily.   Yes [provider]  ferrous sulfate 325 (65 FE) MG EC tablet Take 325 mg by mouth daily with breakfast.   Yes [provider]  furosemide (LASIX) 20 MG tablet 1 once daily, increase to 1 BID for 3 days if edema worsens Patient taking differently: Take 20 mg by mouth daily.  11/04/18  Yes Tanner, Lyndon Code., PA-C  HYDROcodone-acetaminophen (NORCO) 5-325 MG tablet Take 1 tablet by mouth every 6 (six) hours as needed for moderate pain. 10/01/18  Yes Wyatt Portela, MD  metoprolol (LOPRESSOR) 50 MG tablet Take 50 mg by mouth 2 (two) times daily.     Yes [provider]  mirtazapine (REMERON) 7.5 MG tablet Take 1 tablet (7.5 mg total) by mouth at bedtime. 11/04/18  Yes Tanner, Lyndon Code., PA-C  traMADol (ULTRAM) 50 MG tablet Take 1 tablet (50 mg total) by mouth every 6 (six) hours as needed. Patient not taking: Reported  on 11/09/2018 09/15/18   Wyatt Portela, MD    Family History Family History  Problem Relation Age of Onset  . Hypertension Mother     Social History Social History   Tobacco Use  . Smoking status: Never Smoker  . Smokeless tobacco: Never Used  Substance Use Topics  . Alcohol use: No  . Drug use: No     Allergies   Advil [ibuprofen] and Prilosec [omeprazole]   Review of Systems Review of Systems  Constitutional: Positive for activity change.  Respiratory: Negative for shortness of breath.   Cardiovascular: Negative for chest pain.  Genitourinary: Positive  for flank pain.  Skin: Positive for rash.     Physical Exam Updated Vital Signs BP (!) 147/64   Pulse 78   Temp 98 F (36.7 C) (Oral)   Resp 18   Ht '5\' 2"'$  (1.575 m)   Wt 87 kg   SpO2 95%   BMI 35.08 kg/m   Physical Exam Vitals signs and nursing note reviewed.  Constitutional:      Appearance: She is well-developed.  HENT:     Head: Normocephalic and atraumatic.  Neck:     Musculoskeletal: Normal range of motion and neck supple.  Cardiovascular:     Rate and Rhythm: Normal rate.  Pulmonary:     Effort: Pulmonary effort is normal.  Abdominal:     General: Bowel sounds are normal.  Musculoskeletal:     Right lower leg: Edema present.     Left lower leg: Edema present.     Comments: 3+ pitting edema in bilateral lower extremities  Skin:    General: Skin is warm and dry.  Neurological:     Mental Status: She is alert and oriented to person, place, and time.      ED Treatments / Results  Labs (all labs ordered are listed, but only abnormal results are displayed) Labs Reviewed  COMPREHENSIVE METABOLIC PANEL - Abnormal; Notable for the following components:      Result Value   Potassium 3.2 (*)    Glucose, Bld 118 (*)    Calcium 8.6 (*)    Total Protein 5.8 (*)    Albumin 2.4 (*)    All other components within normal limits  CBC WITH DIFFERENTIAL/PLATELET - Abnormal; Notable for the following components:   RBC 3.34 (*)    Hemoglobin 9.5 (*)    HCT 31.2 (*)    All other components within normal limits  URINALYSIS, ROUTINE W REFLEX MICROSCOPIC - Abnormal; Notable for the following components:   Bacteria, UA RARE (*)    All other components within normal limits  BRAIN NATRIURETIC PEPTIDE    EKG None  Radiology No results found.  Procedures Procedures (including critical care time)  Medications Ordered in ED Medications  potassium chloride SA (K-DUR,KLOR-CON) CR tablet 40 mEq (has no administration in time range)     Initial Impression /  Assessment and Plan / ED Course  I have reviewed the triage vital signs and the nursing notes.  Pertinent labs & imaging results that were available during my care of the patient were reviewed by me and considered in my medical decision making (see chart for details).  Clinical Course as of Nov 09 125  Mon Nov 09, 2018  0122 I spoke with Dr. Hal Hope, patient does not have clear indication for admission. We will keep the patient in the ED and have case management see her along with PT and palliative care. If patient's ancillary  team suspect that she needs to be admitted, then a consult will be placed in the morning.   [AN]  0123 Patient and her family aware of the plan.   [AN]    Clinical Course User Index [AN] Varney Biles, MD    77 year old female comes in with chief complaint of worsening leg swelling leading to difficulty in ambulation and falls.  She has known history of advanced renal carcinoma with pathologic fracture of her spine.  She is getting radiation and oral chemotherapy. It appears that her worsening edema in the lower extremity is directly related to her hypoalbuminemia.  Patient was started on 20 mg Lasix, and over the past few days her Lasix were doubled because of worsening in the swelling and there has been no positive yield.  Patient lives by herself and unable to function properly.  She has had 2 falls in the last 24 hours and it is not safe for her to go home.  We will admit patient to the hospital and also consult palliative care so that she can have home health needs and quality of life needs met.  Final Clinical Impressions(s) / ED Diagnoses   Final diagnoses:  3+ pitting edema  Failure to thrive in adult    ED Discharge Orders    None           Varney Biles, MD 11/09/18 0126

## 2018-11-10 DIAGNOSIS — N2889 Other specified disorders of kidney and ureter: Secondary | ICD-10-CM | POA: Diagnosis not present

## 2018-11-10 DIAGNOSIS — I509 Heart failure, unspecified: Secondary | ICD-10-CM | POA: Diagnosis not present

## 2018-11-10 DIAGNOSIS — C7951 Secondary malignant neoplasm of bone: Secondary | ICD-10-CM | POA: Diagnosis not present

## 2018-11-11 DIAGNOSIS — I10 Essential (primary) hypertension: Secondary | ICD-10-CM | POA: Diagnosis not present

## 2018-11-11 DIAGNOSIS — Z96653 Presence of artificial knee joint, bilateral: Secondary | ICD-10-CM | POA: Diagnosis not present

## 2018-11-11 DIAGNOSIS — Z87311 Personal history of (healed) other pathological fracture: Secondary | ICD-10-CM | POA: Diagnosis not present

## 2018-11-11 DIAGNOSIS — R600 Localized edema: Secondary | ICD-10-CM | POA: Diagnosis not present

## 2018-11-11 DIAGNOSIS — C7951 Secondary malignant neoplasm of bone: Secondary | ICD-10-CM | POA: Diagnosis not present

## 2018-11-11 DIAGNOSIS — C649 Malignant neoplasm of unspecified kidney, except renal pelvis: Secondary | ICD-10-CM | POA: Diagnosis not present

## 2018-11-11 DIAGNOSIS — R296 Repeated falls: Secondary | ICD-10-CM | POA: Diagnosis not present

## 2018-11-13 MED FILL — CABOMETYX 20 MG TABLET: 20 | 30 days supply | Qty: 30 | Fill #0

## 2018-11-16 DIAGNOSIS — I10 Essential (primary) hypertension: Secondary | ICD-10-CM | POA: Diagnosis not present

## 2018-11-16 DIAGNOSIS — R600 Localized edema: Secondary | ICD-10-CM | POA: Diagnosis not present

## 2018-11-16 DIAGNOSIS — Z87311 Personal history of (healed) other pathological fracture: Secondary | ICD-10-CM | POA: Diagnosis not present

## 2018-11-16 DIAGNOSIS — Z96653 Presence of artificial knee joint, bilateral: Secondary | ICD-10-CM | POA: Diagnosis not present

## 2018-11-16 DIAGNOSIS — C649 Malignant neoplasm of unspecified kidney, except renal pelvis: Secondary | ICD-10-CM | POA: Diagnosis not present

## 2018-11-16 DIAGNOSIS — C7951 Secondary malignant neoplasm of bone: Secondary | ICD-10-CM | POA: Diagnosis not present

## 2018-11-16 DIAGNOSIS — R296 Repeated falls: Secondary | ICD-10-CM | POA: Diagnosis not present

## 2018-11-17 ENCOUNTER — Other Ambulatory Visit: Payer: Self-pay

## 2018-11-17 ENCOUNTER — Ambulatory Visit: Payer: Medicare Other | Attending: Medical | Admitting: Physical Therapy

## 2018-11-17 ENCOUNTER — Other Ambulatory Visit: Payer: Self-pay | Admitting: Radiation Therapy

## 2018-11-17 DIAGNOSIS — C7951 Secondary malignant neoplasm of bone: Secondary | ICD-10-CM

## 2018-11-17 DIAGNOSIS — R262 Difficulty in walking, not elsewhere classified: Secondary | ICD-10-CM | POA: Diagnosis not present

## 2018-11-17 DIAGNOSIS — M6281 Muscle weakness (generalized): Secondary | ICD-10-CM | POA: Insufficient documentation

## 2018-11-17 DIAGNOSIS — I890 Lymphedema, not elsewhere classified: Secondary | ICD-10-CM | POA: Diagnosis not present

## 2018-11-17 NOTE — Therapy (Addendum)
[place], [place], [address] Phone: [phone]   Fax:  [phone]  Physical Therapy Evaluation  Patient Details  Name: Eileen Norris MRN: [id number] Date of Birth: 1941-02-06 Referring Provider (PT): Von Tanner    Encounter Date: 11/17/2018  PT End of Session - 11/17/18 1146    Visit Number  1    Number of Visits  17    Date for PT Re-Evaluation  01/18/18    PT Start Time  0930    PT Stop Time  1050    PT Time Calculation (min)  80 min    Activity Tolerance  Patient tolerated treatment well    Behavior During Therapy  [facility] for tasks assessed/performed       Past Medical History:  Diagnosis Date  . Arthritis    Neck, knees, hands  . Bone metastases ([place]) 09/01/2018  . Dysrhythmia    "beating fast & flooding"  . GERD (gastroesophageal reflux disease)   . Hypertension     Past Surgical History:  Procedure Laterality Date  . ABDOMINAL HYSTERECTOMY    . ANTERIOR CERVICAL DECOMP/DISCECTOMY FUSION  12/02/2011   Procedure: ANTERIOR CERVICAL DECOMPRESSION/DISCECTOMY FUSION 1 LEVEL;  Surgeon: Hosie Spangle;  Location: [place] [facility];  Service: Neurosurgery;  Laterality: N/A;  Cervical three - four Anterior Cervical Decompression Fusion  . JOINT REPLACEMENT     bilateral knees,     There were no vitals filed for this visit.   Subjective Assessment - 11/17/18 0954    Subjective  Pt states the swelling has come on her all of a sudden and she hasn't been able to walk well for about a month. She says that her right leg shakes with she stands up and puts weight through it. Pt has had 3 visits home health PT but famly wants her to come here so she can get lymphedema treatment and strengthening.   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  she takes a chemo pill every morning  She has been hurting for a whole year before they could find out what was wrong.  She has history of an enlarged heart and arrthmia.    Currently in Pain?  No/denies         [facility] PT Assessment - 11/17/18 0001      Assessment   Medical Diagnosis  renal cell     Referring Provider (PT)  Von Tanner     Onset Date/Surgical Date  08/25/18   approx    Hand Dominance  Right      Precautions   Precautions  Fall      Restrictions   Weight Bearing Restrictions  No      Balance Screen   Has the patient fallen in the past 6 months  Yes    How many times?  3 times last week right leg giving out on her     Has the patient had a decrease in activity level because of a fear of falling?   Yes    Is the patient reluctant to leave their home because of a fear of falling?   Yes  Home Environment   Living Environment  Private residence    Living Arrangements  Children    Available Help at Discharge  Available 24 hours/day    Type of [place] to enter   son assists   Additional Comments  needs a shower chair, wants to get a shower chair form advanced home care       Prior Function   Level of Independence  Independent with basic ADLs      Cognition   Overall Cognitive Status  Within Functional Limits for tasks assessed   pt is concerned about problems walking      Observation/Other Assessments   Observations  pt comes in using rollator walker with son and daughter in law accompanying her.  She is wearing hard cervical collar .  She has TED hose on both lower legs legs with some swelling over the ankle on both legs.  She has 3 small scabbed areas on left lower leg and dry scaly skin on both feet.  skin of legs is pale with deeper color on both feet. Both thighs have loose non pitting edema at knees and thighs with some bogginess in right  distal medial knee. Well healed bilateral total knee scars     Skin Integrity  as above     Other Surveys   Other Surveys   LLIS 16.18     Sensation   Light Touch  Appears Intact      Coordination   Gross Motor Movements are Fluid and Coordinated  No   difficulty with control of right leg in standing      Posture/Postural Control   Posture/Postural Control  Postural limitations    Postural Limitations  Rounded Shoulders;Forward head      ROM / Strength   AROM / PROM / Strength  AROM;Strength      AROM   Overall AROM   Deficits    Overall AROM Comments  appears to have full ROM, cervical area not tested and brace remained intact       Strength   Overall Strength  Deficits    Overall Strength Comments  generally decreased with muscle atrophy apparent.  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       Bed Mobility   Bed Mobility  Supine to Sit;Sit to Supine    Supine to Sit  Moderate Assistance - Patient 50-74%    Sit to Supine  Moderate Assistance - Patient 50-74%   pt needs assist to lift legs, especailly right leg      Transfers   Transfers  Sit to Stand;Stand to Sit    Sit to Stand  4: Min guard   pt pulls up on walker, does not push up with arms    Stand to Sit  4: Min guard   difficulty controlling stand to sit      Ambulation/Gait   Ambulation/Gait  Yes    Ambulation/Gait Assistance  5: Supervision    Ambulation Distance (Feet)  100 Feet    Assistive device  4-wheeled walker    Gait Pattern  Step-to pattern;Decreased step length - right;Decreased step length - left   poor stability of right knee, shoes catch on floor with both   Gait velocity  slow  Gait Comments  slow and difficult pt pushes down with arms to pull legs through Adjusted walker height so pt could stand up straigher.          LYMPHEDEMA/ONCOLOGY QUESTIONNAIRE - 11/17/18 1021      Type   Cancer Type  renal cancer       Right Lower Extremity Lymphedema   30 cm Proximal to Suprapatella  68 cm    20 cm Proximal to Suprapatella  64 cm    10 cm Proximal to Suprapatella  59 cm    At Midpatella/Popliteal Crease  46 cm    30 cm Proximal to Floor at Lateral Plantar Foot  42.5 cm    20 cm Proximal to Floor at Lateral Plantar Foot  32 1    10 cm Proximal to Floor at Lateral Malleoli  28 cm    5 cm Proximal to 1st MTP Joint  23.8 cm    Around Proximal Great Toe  7.5 cm    Other  Pt has 3 small scabbed areas on right lower leg       Left Lower Extremity Lymphedema   30 cm Proximal to Suprapatella  69.5 cm    20 cm Proximal to Suprapatella  63.5 cm    10 cm Proximal to Suprapatella  58.5 cm    At Midpatella/Popliteal Crease  44 cm    30 cm Proximal to Floor at Lateral Plantar Foot  41.6 cm    20 cm Proximal to Floor at Lateral Plantar Foot  35.2 cm    10 cm Proximal to Floor at Lateral Malleoli  28.5 cm    5 cm Proximal to 1st MTP Joint  28.5 cm    Around Proximal Great Toe  8 cm             Outpatient Re[facility]17/[facility]  Lymphedema Life Impact Scale Total Score  16.18 %      Objective measurements completed on examination: See above findings.      [place] Adult PT Treatment/Exercise - 11/17/18 0001      Self-Care   Self-Care  Other Self-Care Comments    Other Self-Care Comments   Explained in detail process of complete deongestive therapy in options for compression for thighs.  Pt givern information about Elastic Therapy in [place] per his request and daughter in law looked up Bioflect leggings as options to help contain thighs. Discussed flat knit and nighttime garments.  Do not feel pt is a candidate for compression pump due to renal cancer.             PT Education - 11/17/18 1146    Education Details  where to get elastic  ompression stockings and Bioflect leggings     Person(s) Educated  Patient    Methods  Explanation;Demonstration    Comprehension  Verbalized understanding       PT Short Term Goals - 11/17/18 1205      PT SHORT TERM GOAL #1   Title  Pt/family will be able to perform MLD and report they know how to use compression to manage lymphedema at home     Time  4    Period  Weeks    Status  New      PT SHORT TERM GOAL #2   Title  Pt will be able to do a basic HEP for strength     Time  4    Period  Weeks  Status  New        PT Long Term Goals - 11/17/18 1207      PT LONG TERM GOAL #1   Title  Pt will be able to walk 200 feet with LRAD with no balance loss     Time  8    Status  New      PT LONG TERM GOAL #2   Title  Pt reports she has not had an episode of fall in the past 4 weeks.     Time  8    Period  Weeks    Status  New      PT LONG TERM GOAL #3   Title  Pt is independent in a progressive home exercise program for strength and endurance     Time  8    Period  Weeks      PT LONG TERM GOAL #4   Title  Pt has increased the number of repetitions of sit to stand in 30 seconds by 3 repetitions indicating an improvment in functional strength     Time  8    Period  Weeks    Status  New             Plan - 11/17/18 1147    Clinical Impression Statement  77 yo female with metastatic renal cancer and LE edema from hypoalbuminemia.  She has metatstatic disease to C7 and needs to wear a cervical collar.  Her goal is to be able to walk better and she wants to be able to control the swelling in her legs, especially her thighs better.  She has had good initial reduction in her lower legs with just TED hose, so she may be some relief with athletic compression crop pants or Bioflect leggings.  Her family says she may be getting Medicaid soon so at that time she may want to look into getting flat knit or nighttime Tribute garmnets. Do not feel she is candidate for compression pump  due to renal cancer. Pt wants to work on exercises here also     History and Personal Factors relevant to plan of care:  ongoing chemotherapy, relatively recent cancer diagnosis bony mets     Clinical Presentation  Evolving    Clinical Presentation due to:  still on chemotherapy     Clinical Decision Making  Moderate    Rehab Potential  Fair    Clinical Impairments Affecting Rehab Potential  bony mets, hypo albuminemia due to chemo     PT Frequency  2x / week    PT Duration  8 weeks    PT Treatment/Interventions  ADLs/Self Care Home Management;Gait training;DME Instruction;Functional mobility training;Therapeutic exercise;Therapeutic activities;Balance training;Manual techniques;Orthotic Fit/Training;Patient/family education;Manual lymph drainage;Compression bandaging;Taping    PT Next Visit Plan  with 90 minute session, : make sure pt is discharged from [place]. assess how pt is doing with elastic compression garments Perform and begin teaching MLD to legs and thighs, diaphragmatic breathing,  So TUG , number of sit to stand in 30 sec. possible TUG then, teach core exercises and bed mobility, LE exercise, progress to standing balance and gait training, etc.  if medicaid is apporved consider flat and nighttime garments     Consulted and Agree with Plan of Care  Patient       Patient will benefit from skilled therapeutic intervention in order to improve the following deficits and impairments:  Abnormal gait, Decreased knowledge of use of DME, Decreased activity tolerance, Decreased balance,  Decreased knowledge of precautions, Decreased endurance, Difficulty walking, Increased edema, Decreased strength, Postural dysfunction  Visit Diagnosis: Lymphedema, not elsewhere classified - Plan: PT plan of care cert/re-cert  Muscle weakness (generalized) - Plan: PT plan of care cert/re-cert  Difficulty in walking, not elsewhere classified - Plan: PT plan of care cert/re-cert     Problem List Patient  Active Problem List   Diagnosis Date Noted  . Right renal mass 09/01/2018  . Pulmonary nodules 09/01/2018  . Bone metastases ([place]) 09/01/2018  . Pathologic fracture of cervical vertebra, initial encounter 08/31/2018  . HTN (hypertension) 08/31/2018  . Lumbar stenosis with neurogenic claudication 04/01/2016  . HNP (herniated nucleus pulposus) with myelopathy, cervical 11/30/2011   Donato Heinz. Owens Shark PT  Norwood Levo 11/17/2018, 12:13 PM  [place], [place], [address] Phone: [phone]   Fax:  [phone]  Name: JENNIFR GAETA MRN: [id number] Date of Birth: [birth year]  PHYSICAL THERAPY DISCHARGE SUMMARY  Visits from Start of Care: 1  Current functional level related to goals / functional outcomes: Pt called to cancel all further appts.   Plan: Patient agrees to discharge.  Patient goals were not met. Patient is being discharged due to not returning since the last visit.  ?????    Maudry Diego, PT 11/26/18 4:32 PM

## 2018-11-19 ENCOUNTER — Encounter: Payer: Self-pay | Admitting: Radiation Therapy

## 2018-11-19 ENCOUNTER — Other Ambulatory Visit: Payer: Self-pay

## 2018-11-19 DIAGNOSIS — C649 Malignant neoplasm of unspecified kidney, except renal pelvis: Secondary | ICD-10-CM

## 2018-11-19 NOTE — Progress Notes (Signed)
Mailed out a appointment reminder for upcoming cervical spine MRI and follow-up visit with Eileen Norris.   Mont Dutton R.T.(R)(T)

## 2018-11-20 ENCOUNTER — Ambulatory Visit (HOSPITAL_COMMUNITY)
Admission: RE | Admit: 2018-11-20 | Discharge: 2018-11-20 | Disposition: A | Discharge status: Home / Self Care | Payer: Medicare Other | Source: Ambulatory Visit | Attending: Oncology | Admitting: Oncology

## 2018-11-20 ENCOUNTER — Ambulatory Visit (HOSPITAL_COMMUNITY): Admission: RE | Admit: 2018-11-20 | Payer: Medicare Other | Source: Ambulatory Visit

## 2018-11-20 ENCOUNTER — Inpatient Hospital Stay: Payer: Medicare Other

## 2018-11-20 ENCOUNTER — Encounter (HOSPITAL_COMMUNITY): Payer: Self-pay

## 2018-11-20 DIAGNOSIS — C641 Malignant neoplasm of right kidney, except renal pelvis: Secondary | ICD-10-CM | POA: Diagnosis not present

## 2018-11-20 DIAGNOSIS — R600 Localized edema: Secondary | ICD-10-CM | POA: Diagnosis not present

## 2018-11-20 DIAGNOSIS — C778 Secondary and unspecified malignant neoplasm of lymph nodes of multiple regions: Secondary | ICD-10-CM | POA: Insufficient documentation

## 2018-11-20 DIAGNOSIS — R609 Edema, unspecified: Secondary | ICD-10-CM | POA: Diagnosis not present

## 2018-11-20 DIAGNOSIS — C649 Malignant neoplasm of unspecified kidney, except renal pelvis: Secondary | ICD-10-CM

## 2018-11-20 DIAGNOSIS — E041 Nontoxic single thyroid nodule: Secondary | ICD-10-CM | POA: Insufficient documentation

## 2018-11-20 DIAGNOSIS — J90 Pleural effusion, not elsewhere classified: Secondary | ICD-10-CM | POA: Insufficient documentation

## 2018-11-20 DIAGNOSIS — C7800 Secondary malignant neoplasm of unspecified lung: Secondary | ICD-10-CM | POA: Diagnosis not present

## 2018-11-20 DIAGNOSIS — C7951 Secondary malignant neoplasm of bone: Secondary | ICD-10-CM | POA: Insufficient documentation

## 2018-11-20 DIAGNOSIS — Z5111 Encounter for antineoplastic chemotherapy: Secondary | ICD-10-CM | POA: Diagnosis not present

## 2018-11-20 DIAGNOSIS — C7801 Secondary malignant neoplasm of right lung: Secondary | ICD-10-CM | POA: Diagnosis not present

## 2018-11-20 DIAGNOSIS — E8809 Other disorders of plasma-protein metabolism, not elsewhere classified: Secondary | ICD-10-CM | POA: Diagnosis not present

## 2018-11-20 DIAGNOSIS — C7802 Secondary malignant neoplasm of left lung: Secondary | ICD-10-CM | POA: Diagnosis not present

## 2018-11-20 LAB — CBC WITH DIFFERENTIAL (CANCER CENTER ONLY)
ABS IMMATURE GRANULOCYTES: 0.01 10*3/uL (ref 0.00–0.07)
Basophils Absolute: 0 10*3/uL (ref 0.0–0.1)
Basophils Relative: 0 %
Eosinophils Absolute: 0.1 10*3/uL (ref 0.0–0.5)
Eosinophils Relative: 1 %
HCT: 35.4 % — ABNORMAL LOW (ref 36.0–46.0)
Hemoglobin: 10.9 g/dL — ABNORMAL LOW (ref 12.0–15.0)
Immature Granulocytes: 0 %
Lymphocytes Relative: 42 %
Lymphs Abs: 1.9 10*3/uL (ref 0.7–4.0)
MCH: 29.5 pg (ref 26.0–34.0)
MCHC: 30.8 g/dL (ref 30.0–36.0)
MCV: 95.7 fL (ref 80.0–100.0)
MONO ABS: 0.4 10*3/uL (ref 0.1–1.0)
Monocytes Relative: 9 %
Neutro Abs: 2.1 10*3/uL (ref 1.7–7.7)
Neutrophils Relative %: 48 %
PLATELETS: 244 10*3/uL (ref 150–400)
RBC: 3.7 MIL/uL — ABNORMAL LOW (ref 3.87–5.11)
RDW: 16.8 % — ABNORMAL HIGH (ref 11.5–15.5)
WBC: 4.5 10*3/uL (ref 4.0–10.5)
nRBC: 0 % (ref 0.0–0.2)

## 2018-11-20 LAB — CMP (CANCER CENTER ONLY)
ALT: 11 U/L (ref 0–44)
AST: 16 U/L (ref 15–41)
Albumin: 2.4 g/dL — ABNORMAL LOW (ref 3.5–5.0)
Alkaline Phosphatase: 106 U/L (ref 38–126)
Anion gap: 8 (ref 5–15)
BUN: 14 mg/dL (ref 8–23)
CO2: 29 mmol/L (ref 22–32)
Calcium: 8.9 mg/dL (ref 8.9–10.3)
Chloride: 109 mmol/L (ref 98–111)
Creatinine: 0.71 mg/dL (ref 0.44–1.00)
GFR, Est AFR Am: >60 mL/min (ref >60)
Glucose, Bld: 107 mg/dL — ABNORMAL HIGH (ref 70–99)
POTASSIUM: 4.2 mmol/L (ref 3.5–5.1)
Sodium: 146 mmol/L — ABNORMAL HIGH (ref 135–145)
TOTAL PROTEIN: 5.9 g/dL — AB (ref 6.5–8.1)
Total Bilirubin: 0.3 mg/dL (ref 0.3–1.2)

## 2018-11-20 MED ORDER — SODIUM CHLORIDE (PF) 0.9 % IJ SOLN
INTRAMUSCULAR | Status: AC
  Filled 2018-11-20: qty 50

## 2018-11-20 MED ORDER — IOHEXOL 300 MG/ML  SOLN
100.0000 mL | Freq: Once | INTRAMUSCULAR | Status: AC | PRN
  Administered 2018-11-20: 100 mL via INTRAVENOUS

## 2018-11-23 ENCOUNTER — Inpatient Hospital Stay (HOSPITAL_BASED_OUTPATIENT_CLINIC_OR_DEPARTMENT_OTHER): Payer: Medicare Other | Admitting: Oncology

## 2018-11-23 ENCOUNTER — Telehealth: Payer: Self-pay

## 2018-11-23 ENCOUNTER — Telehealth: Payer: Self-pay | Admitting: Nutrition

## 2018-11-23 ENCOUNTER — Telehealth: Payer: Self-pay | Admitting: Oncology

## 2018-11-23 ENCOUNTER — Telehealth: Payer: Self-pay | Admitting: Pharmacist

## 2018-11-23 VITALS — BP 159/65 | HR 78 | Temp 98.2°F | Resp 18 | Ht 62.0 in | Wt 191.3 lb

## 2018-11-23 DIAGNOSIS — C641 Malignant neoplasm of right kidney, except renal pelvis: Secondary | ICD-10-CM | POA: Diagnosis not present

## 2018-11-23 DIAGNOSIS — C7800 Secondary malignant neoplasm of unspecified lung: Secondary | ICD-10-CM

## 2018-11-23 DIAGNOSIS — C7951 Secondary malignant neoplasm of bone: Secondary | ICD-10-CM | POA: Diagnosis not present

## 2018-11-23 DIAGNOSIS — E8809 Other disorders of plasma-protein metabolism, not elsewhere classified: Secondary | ICD-10-CM | POA: Diagnosis not present

## 2018-11-23 DIAGNOSIS — R600 Localized edema: Secondary | ICD-10-CM

## 2018-11-23 DIAGNOSIS — C649 Malignant neoplasm of unspecified kidney, except renal pelvis: Secondary | ICD-10-CM

## 2018-11-23 DIAGNOSIS — R609 Edema, unspecified: Secondary | ICD-10-CM | POA: Diagnosis not present

## 2018-11-23 MED ORDER — AXITINIB 5 MG PO TABS: 5.0000 mg | ORAL_TABLET | Freq: Two times a day (BID) | ORAL | 0 refills | Status: DC

## 2018-11-23 MED FILL — INLYTA 5 MG TABLET: 5 | 30 days supply | Qty: 60 | Fill #0

## 2018-11-23 NOTE — Telephone Encounter (Signed)
Oral Oncology Patient Advocate Encounter  Received notification from [place] that prior authorization for Inlyta is required.  PA submitted on CoverMyMeds Key [id number] Status is pending  Oral Oncology Clinic will continue to follow.  [place] Patient [place] Phone [phone] Fax [phone]

## 2018-11-23 NOTE — Telephone Encounter (Signed)
Gave patient relative avs report and appointments for January

## 2018-11-23 NOTE — Telephone Encounter (Signed)
Oral Chemotherapy Pharmacist Encounter   I spoke with patient's DIL, [name], for overview of: Inlyta (axitinib) for the treatment of renal cell carcinoma that has progressed on therapy with Cabometyx (cabozantinib), planned duration until disease progression or unacceptable toxicity.   Counseled on administration, dosing, side effects, monitoring, drug-food interactions, safe handling, storage, and disposal.  Patient will take Inlyta 5mg  tablets, 1 tablet by mouth twice daily, without regard to food, with a glass of water.  [name] instructed that patient is to avoid grapefruit and grapefruit juice while on therapy with Inlyta.  Inlyta start date: 11/24/18  Adverse effects include but are not limited to: hypertension, hand-foot syndrome, nausea, vomiting, diarrhea, fatigue, dysphonia (hoarseness), and abnormal laboratory values.   They will obtain anti diarrheal and alert the office of 4 or more loose stools above baseline.  Inlyta will be held at least 24 hours prior to surgery and resumed at discretion of treating physician based on wound healing  Reviewed importance of keeping a medication schedule and plan for any missed doses.  [name] voiced understanding and appreciation.   All questions answered. Medication reconciliation performed and medication/allergy list updated.  Insurance authorization obtained and test at the [facility] revealed copayment $0 at this time. We will arrange for the 1st fill of Inlyta to be picked up at the pharmacy this afternoon. She will start her Inlyta tomorrow morning.  [name] stated Eileen Norris is complaining of mouth soreness. They will alert the office if this continues. [name] also would like referral to a therapist to help Eileen Norris cope with her diagnosis and it's progression. MD will be notified of request.  [name] knows to call the office with questions or concerns. Oral Oncology Clinic will continue to  follow.  [name], PharmD, BCPS, BCOP  11/23/2018   3:47 PM Oral Oncology Clinic [phone]

## 2018-11-23 NOTE — Telephone Encounter (Signed)
Oral Oncology Patient Advocate Encounter  Prior Authorization for Eileen Norris has been approved.    PA# [id number] Effective dates: 11/23/18 through 11/25/19  Oral Oncology Clinic will continue to follow.   [place] Patient [place] Phone [phone] Fax [phone]

## 2018-11-23 NOTE — Telephone Encounter (Signed)
Eileen Norris called to reschedule [facility] nutrition appointment. She will be seen after MD on Jan 27th.

## 2018-11-23 NOTE — Telephone Encounter (Signed)
Oral Oncology Pharmacist Encounter  Received new prescription for Inlyta (axitinib) for the treatment of renal cell carcinoma that has progressed on therapy with Cabometyx (cabozantinib), planned duration until disease progression or unacceptable toxicity.  Patient with original diagnosis in October 2019 and was startaed on Cabometyx at that time. Patent is s/p 2 months of treatment with Cabometyx and CT scan performed 11/20/18 shows evidence of disease progression. Patient is now under evaluation to start 2nd line treatment with Inlyta (axitinib) as monotherapy.  Inlyta is planned to be administered at 5mg  twice daily.  Labs from 11/20/18 assessed, OK for treatment.  BPs in Epic reviewed, some readings WNL, some above normal limits Patient will be counseled about BP control and increased risk of hypertension with Inlyta treatment  Last TSH in Epic from 2013, will be monitored periodically during treatment  UA performed 11/08/18 is negative for proteinuria, will continue to be monitored periodically during treatment  No cardiac dysfunction noted in [place] Patient will be monitored for signs or symptoms of cardiac dysfunction while on treatment  Current medication list in Epic reviewed, no DDIs with Inlyta identified.  Prescription has been e-scribed to the [facility] for benefits analysis and approval. Patient noted to have [employer] ([place]) through the Social Security office during benefits investigation for Cabometyx.  Oral Oncology Clinic will continue to follow for insurance authorization, copayment issues, initial counseling and start date.  [name], PharmD, BCPS, BCOP  11/23/2018 12:25 PM Oral Oncology Clinic [phone]

## 2018-11-23 NOTE — Progress Notes (Signed)
Hematology and Oncology Follow Up Visit  Eileen Norris [id number] Mar 26, 1941 77 y.o. 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  She presented with a right kidney mass in addition to metastatic disease.  Prior Therapy:  He is status post percutaneous biopsy of a right iliac metastatic lesion on 09/02/2018.  C7 stereotactic radiosurgery completed on 09/14/2018 under the care of Dr. Sherwood Gambler and Dr. Lisbeth Renshaw.  Current therapy: Cabometyx 20 mg started September 17, 2018.  Interim History: Eileen Norris presents today for a follow-up.  Since her last visit, she reports no major changes in her health.  She continues to have generalized weakness and fatigue as well as worsening lower extremity edema.  She has nutritional evaluation as well as evaluation at the lymphedema clinic.  Her mobility has been limited and she is confined mostly to a chair.  She denies any worsening neck pain but does report worsening flank pain at times for which she takes very little pain medication.  Appetite have been marginal overall.  She does not report any headaches, blurry vision, syncope or seizures.  She denies any alteration in mental status or lethargy.  Does not report any fevers, chills or sweats.  Does not report any cough, wheezing or hemoptysis.  Does not report any chest pain, palpitation, orthopnea. Does not report any nausea, vomiting or abdominal distention.  Does not report any constipation or diarrhea. Does not report any bone pain or pathological fractures.   Does not report frequency, urgency or hematuria.  Does not report any ecchymosis or petechiae.  Does not report any lymphadenopathy.  Denies any anxiety or depression.  Remaining review of systems is negative.    Medications: I have reviewed the patient's current medications.  Current Outpatient  Medications  Medication Sig Dispense Refill  . amLODipine (NORVASC) 10 MG tablet Take 1 tablet (10 mg total) by mouth daily. 30 tablet 1  . CABOMETYX 20 MG tablet TAKE 1 TABLET (20 MG TOTAL) BY MOUTH DAILY. TAKE ON AN EMPTY STOMACH, 1 HOUR BEFORE OR 2 HOURS AFTER MEALS. (Patient taking differently: Take 20 mg by mouth daily. ) 30 tablet 0  . Cholecalciferol (VITAMIN D-3) 1000 units CAPS Take 1,000 Units by mouth daily.    . ferrous sulfate 325 (65 FE) MG EC tablet Take 325 mg by mouth daily with breakfast.    . furosemide (LASIX) 20 MG tablet 1 once daily, increase to 1 BID for 3 days if edema worsens (Patient taking differently: Take 20 mg by mouth daily. ) 60 tablet 1  . HYDROcodone-acetaminophen (NORCO) 5-325 MG tablet Take 1 tablet by mouth every 6 (six) hours as needed for moderate pain. 40 tablet 0  . metoprolol (LOPRESSOR) 50 MG tablet Take 50 mg by mouth 2 (two) times daily.      . mirtazapine (REMERON) 7.5 MG tablet Take 1 tablet (7.5 mg total) by mouth at bedtime. 30 tablet 2  . traMADol (ULTRAM) 50 MG tablet Take 1 tablet (50 mg total) by mouth every 6 (six) hours as needed. (Patient not taking: Reported on 11/09/2018) 30 tablet 1   No current facility-administered medications for this visit.      Allergies:  Allergies  Allergen Reactions  . Advil [Ibuprofen] Other (See Comments)    "Gives me the shakes"  . Prilosec [Omeprazole] Other (See Comments)    Constipation  Past Medical History, Surgical history, Social history, and Family History were reviewed and updated.  Review of Systems:    Physical Exam:   Blood pressure (!) 159/65, pulse 78, temperature 98.2 F (36.8 C), temperature source Oral, resp. rate 18, height 5\' 2"  (1.575 m), weight 191 lb 4.8 oz (86.8 kg), SpO2 95 %.    ECOG:  2   General appearance: Alert, awake without any distress. Head: Atraumatic without abnormalities Oropharynx: Without any thrush or ulcers. Eyes: No scleral icterus. Lymph  nodes: No lymphadenopathy noted in the cervical, supraclavicular, or axillary nodes Heart:regular rate and rhythm, without any murmurs or gallops.    Bilateral lower extremity edema noted. Lung: Clear to auscultation without any rhonchi, wheezes or dullness to percussion. Abdomin: Soft, nontender without any shifting dullness or ascites. Musculoskeletal: No clubbing or cyanosis. Neurological: No motor or sensory deficits. Skin: No rashes or lesions. Psychiatric: Mood and affect appeared normal.      Lab Results: Lab Results  Component Value Date   WBC 4.5 11/20/2018   HGB 10.9 (L) 11/20/2018   HCT 35.4 (L) 11/20/2018   MCV 95.7 11/20/2018   PLT 244 11/20/2018     Chemistry      Component Value Date/Time   NA 146 (H) 11/20/2018 0843   NA 144 11/30/2011 0418   K 4.2 11/20/2018 0843   K 3.6 11/30/2011 0418   CL 109 11/20/2018 0843   CL 105 11/30/2011 0418   CO2 29 11/20/2018 0843   CO2 29 11/30/2011 0418   BUN 14 11/20/2018 0843   BUN 19 (H) 11/30/2011 0418   CREATININE 0.71 11/20/2018 0843   CREATININE 0.98 11/30/2011 0418      Component Value Date/Time   CALCIUM 8.9 11/20/2018 0843   CALCIUM 8.6 11/30/2011 0418   ALKPHOS 106 11/20/2018 0843   ALKPHOS 70 11/28/2011 2152   AST 16 11/20/2018 0843   ALT 11 11/20/2018 0843   ALT 23 11/28/2011 2152   BILITOT 0.3 11/20/2018 0843     EXAM: CT CHEST WITH CONTRAST  CT ABDOMEN AND PELVIS WITH AND WITHOUT CONTRAST  TECHNIQUE: Multidetector CT imaging of the chest was performed during intravenous contrast administration. Multidetector CT imaging of the abdomen and pelvis was performed following the standard protocol before and during bolus administration of intravenous contrast.  CONTRAST:  156mL OMNIPAQUE IOHEXOL 300 MG/ML  SOLN  COMPARISON:  Prior examination 08/31/2018.  Cervical MRI 09/03/2018.  FINDINGS: CT CHEST FINDINGS  Cardiovascular: Mild atherosclerosis of the aorta, great vessels and coronary  arteries. No acute vascular findings are demonstrated. The contrast bolus is suboptimal, making differentiation between the pulmonary vessels and hilar lymph nodes difficult. Probable aortic valvular calcifications. The heart size is normal. There is no pericardial effusion.  Mediastinum/Nodes: There are multiple mildly enlarged mediastinal and hilar lymph nodes, consistent with metastatic disease. These include a 12 mm right paratracheal node on image 35/11, a 12 mm subcarinal node on image 44/11, an approximately 16 mm right hilar node on image 46/11 and a 15 mm left hilar node on image 51/11. Grossly stable enhancing right thyroid nodule measuring 2.8 x 3.6 cm on image 9/11. The trachea and esophagus appear unremarkable.  Lungs/Pleura: New moderate size dependent left pleural effusion and tiny right pleural effusion without significant nodularity. Widespread pulmonary metastatic disease is again noted with some progression. Subpleural metastasis anteriorly in the left hemithorax measures 2.0 x 1.3 cm on image 45/11. Other nodules include a 10 mm right upper lobe nodule on image 48/12,  a 9 mm right lower lobe nodule on image 107/12, a 15 x 12 mm left upper lobe nodule on image 75/12 and a 12 mm left lower lobe nodule on image 92/12. There is mild left lower lobe dependent atelectasis.  Musculoskeletal/Chest wall: Lytic metastasis and pathologic fracture at C7 again noted. No new osseous metastases identified in the chest.  CT ABDOMEN AND PELVIS FINDINGS  Hepatobiliary: The liver is normal in density without suspicious focal abnormality. The gallbladder is incompletely distended. No evidence of gallstones, wall thickening or significant biliary dilatation.  Pancreas: Unremarkable. No pancreatic ductal dilatation or surrounding inflammatory changes.  Spleen: Normal in size without focal abnormality.  Adrenals/Urinary Tract: Both adrenal glands appear normal.  The heterogeneously enhancing mass involving the lower pole of the right kidney appears slightly smaller, measuring 5.4 x 4.0 cm on image 118/11 (previously 6.5 x 5.3 cm). This mass demonstrates less enhancement compared with the previous CT. Left renal cysts are grossly stable. No evidence of hydronephrosis or bladder abnormality.  Stomach/Bowel: No evidence of bowel wall thickening, distention or surrounding inflammatory change.  Vascular/Lymphatic: 10 mm aortocaval node on image 132/11 is partly obscured by artifact from the lumbar spine surgical hardware, but grossly stable. 13 mm retrocaval node on image 111/11 has slightly enlarged. No other progressive adenopathy identified. There is mild aortic and branch vessel atherosclerosis. No evidence of renal vein or IVC tumor thrombus.  Reproductive: Hysterectomy.  No adnexal mass.  Other: Mild generalized soft tissue edema without peritoneal nodularity or significant ascites. Stable lipoma superiorly in the right rectus abdominus muscle.  Musculoskeletal: Stable postsurgical changes in the lower lumbar spine without definite osseous metastatic disease in this area. There are enlarging osseous metastases involving posterosuperior medial aspect of the right iliac bone, measuring up to 5.0 x 3.3 cm on image 147/11 and the right inferior pubic ramus, measuring up to 5.7 x 2.8 cm on image 187/11. The small lucent areas with sclerotic margins in the left iliac bone are stable.  IMPRESSION: 1. Progressive nodal metastatic disease within mediastinal, hilar and upper retroperitoneal lymph nodes. 2. Progressive pulmonary metastatic disease. New left-greater-than-right pleural effusions. 3. Progressive osseous metastatic disease with primary involvement involving the C7 vertebral body, right iliac bone and right inferior pubic ramus. 4. The primary right renal cell carcinoma is slightly smaller with less enhancement. 5. Stable  right thyroid nodule, unlikely to be clinically significant given the patient's metastatic disease.    Impression and Plan:  77 year old woman with the following:  1.  Renal cell carcinoma presented with right kidney mass as well as documented metastasis to the bone, lung and lymphadenopathy in October 2019.   She has been on Cabometyx 20 mg and completed 2 months of therapy.  CT scan obtained on 11/20/2018 was personally reviewed which showed progression of disease with nodal metastasis as well as pulmonary metastasis and pleural effusion.  Osseous metastasis appears to have worsened as well.  Options of therapy were reviewed today which include to a different oral targeted therapy, switching to intravenous immune therapy or proceeding with supportive care and hospice.  After discussion and reviewing the risks and benefits of all these approaches, she elected to try a different oral targeted therapy.  Risks and benefits associated with axitinib was reviewed today.  These complications including hypertension, diarrhea among others.  The likelihood of success is low but she is willing to try it and she understands that likely will require hospice in the near future.   2.  Cervical  spine metastasis: No worsening pain reported after completing radiation therapy.  3.  Prognosis and goals of care: Prognosis is overall poor with anticipated limited life expectancy.  Her disease is incurable and her performance status is declining.  4.  Pain: Manageable at this time and takes very little pain medication.  5.  Lower extremity edema: She is on diuretic and has evaluation for lymphedema.  6.  Follow-up: We will be in 4 weeks to follow her progress.  25  minutes was spent with the patient face-to-face today.  More than 50% of time was dedicated to reviewing laboratory data, imaging studies as well as discussing options of care.     Zola Button, MD 12/30/20199:48 AM

## 2018-11-24 NOTE — Telephone Encounter (Signed)
Oral Oncology Patient Advocate Encounter  Confirmed with [place] that Eileen Norris was picked up on 11/23/18 with a $0 copay   [facility] [place] Phone [phone] Fax [phone]

## 2018-11-25 DIAGNOSIS — R296 Repeated falls: Secondary | ICD-10-CM | POA: Diagnosis not present

## 2018-11-25 DIAGNOSIS — Z87311 Personal history of (healed) other pathological fracture: Secondary | ICD-10-CM | POA: Diagnosis not present

## 2018-11-25 DIAGNOSIS — I10 Essential (primary) hypertension: Secondary | ICD-10-CM | POA: Diagnosis not present

## 2018-11-25 DIAGNOSIS — Z96653 Presence of artificial knee joint, bilateral: Secondary | ICD-10-CM | POA: Diagnosis not present

## 2018-11-25 DIAGNOSIS — C649 Malignant neoplasm of unspecified kidney, except renal pelvis: Secondary | ICD-10-CM | POA: Diagnosis not present

## 2018-11-25 DIAGNOSIS — C7951 Secondary malignant neoplasm of bone: Secondary | ICD-10-CM | POA: Diagnosis not present

## 2018-11-25 DIAGNOSIS — R600 Localized edema: Secondary | ICD-10-CM | POA: Diagnosis not present

## 2018-11-26 ENCOUNTER — Telehealth: Payer: Self-pay | Admitting: Pharmacist

## 2018-11-26 ENCOUNTER — Ambulatory Visit: Payer: Medicare Other | Admitting: Rehabilitation

## 2018-11-26 ENCOUNTER — Telehealth: Payer: Self-pay | Admitting: *Deleted

## 2018-11-26 NOTE — Telephone Encounter (Signed)
Daughter [name] calling. States patient has a bed sore and she is frustrated with advanced home care. Gave patient # to [facility] and encouraged her to have patient seen by primary physician to evaluate  Wound care and referral for home health.

## 2018-11-26 NOTE — Telephone Encounter (Signed)
Oral Oncology Pharmacist Encounter  Received call from patient's DIL, [name], with questions about who would be able to call in an antibiotic for patient's bed sore. [name] instructed to reach out to PCP or take patient to an Urgent Care for assessment. [name] states they do have services from [facility], and she may reach out to them for assessment of bed sore. [name] requests to speak to Dr. Hazeline Junker collaborative practice RN to see if he will prescribe an antibiotic. [name] informed that most prescribers would want to assess the infection prior to prescribing a medication for treatment. I am unsure if our symptom management clinic is staffed today or that Dr. Alen Blew would be able to assess patient for this and prescribe antibiotic. [name] transferred to Visual merchandiser.  [name], PharmD, BCPS, BCOP  11/26/2018 8:39 AM Oral Oncology Clinic [phone]

## 2018-11-27 ENCOUNTER — Telehealth: Payer: Self-pay | Admitting: *Deleted

## 2018-11-27 ENCOUNTER — Telehealth: Payer: Self-pay | Admitting: Pharmacist

## 2018-11-27 DIAGNOSIS — Z87311 Personal history of (healed) other pathological fracture: Secondary | ICD-10-CM | POA: Diagnosis not present

## 2018-11-27 DIAGNOSIS — R600 Localized edema: Secondary | ICD-10-CM | POA: Diagnosis not present

## 2018-11-27 DIAGNOSIS — I10 Essential (primary) hypertension: Secondary | ICD-10-CM | POA: Diagnosis not present

## 2018-11-27 DIAGNOSIS — Z96653 Presence of artificial knee joint, bilateral: Secondary | ICD-10-CM | POA: Diagnosis not present

## 2018-11-27 DIAGNOSIS — R296 Repeated falls: Secondary | ICD-10-CM | POA: Diagnosis not present

## 2018-11-27 DIAGNOSIS — C7951 Secondary malignant neoplasm of bone: Secondary | ICD-10-CM | POA: Diagnosis not present

## 2018-11-27 DIAGNOSIS — C649 Malignant neoplasm of unspecified kidney, except renal pelvis: Secondary | ICD-10-CM | POA: Diagnosis not present

## 2018-11-27 NOTE — Telephone Encounter (Signed)
Eileen Norris with [facility] calling. Since starting inlyta, patient is getting up to the bathroom each night every 1 and 1/2 hours to urinate. Has been holding lasix.  emily also requests a referral for [facility] nurse evaluation of stage 2 pressure sore to buttocks? states she has contacted patient's primary care for this order but has not had a response.

## 2018-11-27 NOTE — Telephone Encounter (Signed)
Per dr Alen Blew, Verbal order given to Cherly Anderson at [facility] for nursing evaluation of skin breakdown to buttocks

## 2018-11-27 NOTE — Telephone Encounter (Signed)
Ok to send the referral  

## 2018-11-27 NOTE — Telephone Encounter (Signed)
Oral Oncology Pharmacist Encounter  Received call from patient's DIL, [name], with complaints that patient had been wetting her bed last night. This is a new problem.  [name] states that the medication information for axitinib that she was provided in office on 11/23/18 states to call the doctor's office if patient experiences increase in urination. I am unsure which medication information was given to patient and family in office, as I did not see them that day.  I referenced 4 different medication information references (Lexicomp, Via patient handouts, manufacturer website, and chemotherapy education sheets provided by [facility]) and did not find polyuria as a adverse event associated with axitinib use.  We discussed that wide variety of causes of polyuria. [name] states that [facility] will be coming to the house this morning at [place] instructed to alert the nurse of these new findings and that this may need evaluation by patient's PCP.  Note will be forwarded to MD and collaborative practice RN.  [name], PharmD, BCPS, BCOP  11/27/2018 9:07 AM Oral Oncology Clinic [phone]

## 2018-11-28 DIAGNOSIS — I10 Essential (primary) hypertension: Secondary | ICD-10-CM | POA: Diagnosis not present

## 2018-11-28 DIAGNOSIS — R296 Repeated falls: Secondary | ICD-10-CM | POA: Diagnosis not present

## 2018-11-28 DIAGNOSIS — Z96653 Presence of artificial knee joint, bilateral: Secondary | ICD-10-CM | POA: Diagnosis not present

## 2018-11-28 DIAGNOSIS — Z87311 Personal history of (healed) other pathological fracture: Secondary | ICD-10-CM | POA: Diagnosis not present

## 2018-11-28 DIAGNOSIS — C649 Malignant neoplasm of unspecified kidney, except renal pelvis: Secondary | ICD-10-CM | POA: Diagnosis not present

## 2018-11-28 DIAGNOSIS — C7951 Secondary malignant neoplasm of bone: Secondary | ICD-10-CM | POA: Diagnosis not present

## 2018-11-28 DIAGNOSIS — R600 Localized edema: Secondary | ICD-10-CM | POA: Diagnosis not present

## 2018-11-30 ENCOUNTER — Telehealth: Payer: Self-pay | Admitting: *Deleted

## 2018-11-30 ENCOUNTER — Encounter: Payer: Medicare Other | Admitting: Nutrition

## 2018-11-30 NOTE — Telephone Encounter (Signed)
Returned patient's daughter- Foy [place] phone call, spoke with [name]

## 2018-12-01 ENCOUNTER — Encounter: Payer: Medicare Other | Admitting: Physical Therapy

## 2018-12-01 DIAGNOSIS — Z87311 Personal history of (healed) other pathological fracture: Secondary | ICD-10-CM | POA: Diagnosis not present

## 2018-12-01 DIAGNOSIS — C7951 Secondary malignant neoplasm of bone: Secondary | ICD-10-CM | POA: Diagnosis not present

## 2018-12-01 DIAGNOSIS — R296 Repeated falls: Secondary | ICD-10-CM | POA: Diagnosis not present

## 2018-12-01 DIAGNOSIS — R600 Localized edema: Secondary | ICD-10-CM | POA: Diagnosis not present

## 2018-12-01 DIAGNOSIS — Z96653 Presence of artificial knee joint, bilateral: Secondary | ICD-10-CM | POA: Diagnosis not present

## 2018-12-01 DIAGNOSIS — I10 Essential (primary) hypertension: Secondary | ICD-10-CM | POA: Diagnosis not present

## 2018-12-01 DIAGNOSIS — C649 Malignant neoplasm of unspecified kidney, except renal pelvis: Secondary | ICD-10-CM | POA: Diagnosis not present

## 2018-12-03 DIAGNOSIS — Z87311 Personal history of (healed) other pathological fracture: Secondary | ICD-10-CM | POA: Diagnosis not present

## 2018-12-03 DIAGNOSIS — I10 Essential (primary) hypertension: Secondary | ICD-10-CM | POA: Diagnosis not present

## 2018-12-03 DIAGNOSIS — R600 Localized edema: Secondary | ICD-10-CM | POA: Diagnosis not present

## 2018-12-03 DIAGNOSIS — C649 Malignant neoplasm of unspecified kidney, except renal pelvis: Secondary | ICD-10-CM | POA: Diagnosis not present

## 2018-12-03 DIAGNOSIS — C7951 Secondary malignant neoplasm of bone: Secondary | ICD-10-CM | POA: Diagnosis not present

## 2018-12-03 DIAGNOSIS — Z96653 Presence of artificial knee joint, bilateral: Secondary | ICD-10-CM | POA: Diagnosis not present

## 2018-12-03 DIAGNOSIS — R296 Repeated falls: Secondary | ICD-10-CM | POA: Diagnosis not present

## 2018-12-08 ENCOUNTER — Encounter: Payer: Medicare Other | Admitting: Physical Therapy

## 2018-12-09 ENCOUNTER — Telehealth: Payer: Self-pay | Admitting: *Deleted

## 2018-12-09 DIAGNOSIS — I10 Essential (primary) hypertension: Secondary | ICD-10-CM | POA: Diagnosis not present

## 2018-12-09 DIAGNOSIS — R296 Repeated falls: Secondary | ICD-10-CM | POA: Diagnosis not present

## 2018-12-09 DIAGNOSIS — Z87311 Personal history of (healed) other pathological fracture: Secondary | ICD-10-CM | POA: Diagnosis not present

## 2018-12-09 DIAGNOSIS — R600 Localized edema: Secondary | ICD-10-CM | POA: Diagnosis not present

## 2018-12-09 DIAGNOSIS — C649 Malignant neoplasm of unspecified kidney, except renal pelvis: Secondary | ICD-10-CM | POA: Diagnosis not present

## 2018-12-09 DIAGNOSIS — Z96653 Presence of artificial knee joint, bilateral: Secondary | ICD-10-CM | POA: Diagnosis not present

## 2018-12-09 DIAGNOSIS — C7951 Secondary malignant neoplasm of bone: Secondary | ICD-10-CM | POA: Diagnosis not present

## 2018-12-09 NOTE — Telephone Encounter (Signed)
Faxed last O.V. note and forms for wheel chair to [facility]

## 2018-12-10 DIAGNOSIS — R296 Repeated falls: Secondary | ICD-10-CM | POA: Diagnosis not present

## 2018-12-10 DIAGNOSIS — Z87311 Personal history of (healed) other pathological fracture: Secondary | ICD-10-CM | POA: Diagnosis not present

## 2018-12-10 DIAGNOSIS — I10 Essential (primary) hypertension: Secondary | ICD-10-CM | POA: Diagnosis not present

## 2018-12-10 DIAGNOSIS — C649 Malignant neoplasm of unspecified kidney, except renal pelvis: Secondary | ICD-10-CM | POA: Diagnosis not present

## 2018-12-10 DIAGNOSIS — C7951 Secondary malignant neoplasm of bone: Secondary | ICD-10-CM | POA: Diagnosis not present

## 2018-12-10 DIAGNOSIS — Z96653 Presence of artificial knee joint, bilateral: Secondary | ICD-10-CM | POA: Diagnosis not present

## 2018-12-10 DIAGNOSIS — R600 Localized edema: Secondary | ICD-10-CM | POA: Diagnosis not present

## 2018-12-11 ENCOUNTER — Ambulatory Visit: Payer: Medicare Other | Admitting: Physical Therapy

## 2018-12-11 DIAGNOSIS — C7951 Secondary malignant neoplasm of bone: Secondary | ICD-10-CM | POA: Diagnosis not present

## 2018-12-14 ENCOUNTER — Other Ambulatory Visit: Payer: Self-pay | Admitting: Oncology

## 2018-12-14 DIAGNOSIS — C649 Malignant neoplasm of unspecified kidney, except renal pelvis: Secondary | ICD-10-CM

## 2018-12-15 ENCOUNTER — Other Ambulatory Visit: Payer: Self-pay | Admitting: Radiation Therapy

## 2018-12-16 DIAGNOSIS — I10 Essential (primary) hypertension: Secondary | ICD-10-CM | POA: Diagnosis not present

## 2018-12-16 DIAGNOSIS — Z96653 Presence of artificial knee joint, bilateral: Secondary | ICD-10-CM | POA: Diagnosis not present

## 2018-12-16 DIAGNOSIS — R600 Localized edema: Secondary | ICD-10-CM | POA: Diagnosis not present

## 2018-12-16 DIAGNOSIS — C7951 Secondary malignant neoplasm of bone: Secondary | ICD-10-CM | POA: Diagnosis not present

## 2018-12-16 DIAGNOSIS — C649 Malignant neoplasm of unspecified kidney, except renal pelvis: Secondary | ICD-10-CM | POA: Diagnosis not present

## 2018-12-16 DIAGNOSIS — Z87311 Personal history of (healed) other pathological fracture: Secondary | ICD-10-CM | POA: Diagnosis not present

## 2018-12-16 DIAGNOSIS — R296 Repeated falls: Secondary | ICD-10-CM | POA: Diagnosis not present

## 2018-12-18 ENCOUNTER — Ambulatory Visit (HOSPITAL_COMMUNITY)
Admission: RE | Admit: 2018-12-18 | Discharge: 2018-12-18 | Disposition: A | Discharge status: Home / Self Care | Payer: Medicare Other | Source: Ambulatory Visit | Attending: Radiation Oncology | Admitting: Radiation Oncology

## 2018-12-18 ENCOUNTER — Ambulatory Visit (HOSPITAL_COMMUNITY): Payer: Medicare Other

## 2018-12-18 DIAGNOSIS — C7951 Secondary malignant neoplasm of bone: Secondary | ICD-10-CM | POA: Insufficient documentation

## 2018-12-18 DIAGNOSIS — M4802 Spinal stenosis, cervical region: Secondary | ICD-10-CM | POA: Diagnosis not present

## 2018-12-18 DIAGNOSIS — C649 Malignant neoplasm of unspecified kidney, except renal pelvis: Secondary | ICD-10-CM | POA: Diagnosis not present

## 2018-12-18 MED ORDER — GADOBUTROL 1 MMOL/ML IV SOLN
10.0000 mL | Freq: Once | INTRAVENOUS | Status: AC | PRN
  Administered 2018-12-18: 9 mL via INTRAVENOUS

## 2018-12-21 ENCOUNTER — Telehealth: Payer: Self-pay

## 2018-12-21 ENCOUNTER — Inpatient Hospital Stay: Payer: Medicare Other | Attending: Oncology | Admitting: Oncology

## 2018-12-21 ENCOUNTER — Inpatient Hospital Stay: Payer: Medicare Other | Admitting: Nutrition

## 2018-12-21 ENCOUNTER — Inpatient Hospital Stay: Payer: Medicare Other

## 2018-12-21 VITALS — BP 177/83 | HR 70 | Temp 98.2°F | Resp 18 | Ht 62.0 in | Wt 176.1 lb

## 2018-12-21 DIAGNOSIS — C649 Malignant neoplasm of unspecified kidney, except renal pelvis: Secondary | ICD-10-CM

## 2018-12-21 DIAGNOSIS — R600 Localized edema: Secondary | ICD-10-CM | POA: Insufficient documentation

## 2018-12-21 DIAGNOSIS — Z79899 Other long term (current) drug therapy: Secondary | ICD-10-CM | POA: Insufficient documentation

## 2018-12-21 DIAGNOSIS — C641 Malignant neoplasm of right kidney, except renal pelvis: Secondary | ICD-10-CM | POA: Diagnosis not present

## 2018-12-21 DIAGNOSIS — C7800 Secondary malignant neoplasm of unspecified lung: Secondary | ICD-10-CM | POA: Insufficient documentation

## 2018-12-21 DIAGNOSIS — D649 Anemia, unspecified: Secondary | ICD-10-CM | POA: Insufficient documentation

## 2018-12-21 DIAGNOSIS — Z923 Personal history of irradiation: Secondary | ICD-10-CM | POA: Diagnosis not present

## 2018-12-21 DIAGNOSIS — C7951 Secondary malignant neoplasm of bone: Secondary | ICD-10-CM | POA: Diagnosis not present

## 2018-12-21 LAB — CBC WITH DIFFERENTIAL (CANCER CENTER ONLY)
Abs Immature Granulocytes: 0.01 10*3/uL (ref 0.00–0.07)
BASOS ABS: 0 10*3/uL (ref 0.0–0.1)
Basophils Relative: 1 %
Eosinophils Absolute: 0.1 10*3/uL (ref 0.0–0.5)
Eosinophils Relative: 2 %
HCT: 34.4 % — ABNORMAL LOW (ref 36.0–46.0)
Hemoglobin: 10.8 g/dL — ABNORMAL LOW (ref 12.0–15.0)
Immature Granulocytes: 0 %
Lymphocytes Relative: 42 %
Lymphs Abs: 1.5 10*3/uL (ref 0.7–4.0)
MCH: 30.1 pg (ref 26.0–34.0)
MCHC: 31.4 g/dL (ref 30.0–36.0)
MCV: 95.8 fL (ref 80.0–100.0)
Monocytes Absolute: 0.4 10*3/uL (ref 0.1–1.0)
Monocytes Relative: 12 %
Neutro Abs: 1.5 10*3/uL — ABNORMAL LOW (ref 1.7–7.7)
Neutrophils Relative %: 43 %
Platelet Count: 241 10*3/uL (ref 150–400)
RBC: 3.59 MIL/uL — AB (ref 3.87–5.11)
RDW: 16 % — ABNORMAL HIGH (ref 11.5–15.5)
WBC: 3.4 10*3/uL — AB (ref 4.0–10.5)
nRBC: 0 % (ref 0.0–0.2)

## 2018-12-21 LAB — CMP (CANCER CENTER ONLY)
ALT: 8 U/L (ref 0–44)
ANION GAP: 9 (ref 5–15)
AST: 14 U/L — ABNORMAL LOW (ref 15–41)
Albumin: 3.1 g/dL — ABNORMAL LOW (ref 3.5–5.0)
Alkaline Phosphatase: 95 U/L (ref 38–126)
BUN: 12 mg/dL (ref 8–23)
CHLORIDE: 105 mmol/L (ref 98–111)
CO2: 30 mmol/L (ref 22–32)
Calcium: 9.3 mg/dL (ref 8.9–10.3)
Creatinine: 0.64 mg/dL (ref 0.44–1.00)
GFR, Est AFR Am: >60 mL/min (ref >60)
GFR, Estimated: >60 mL/min (ref >60)
Glucose, Bld: 99 mg/dL (ref 70–99)
Potassium: 3.8 mmol/L (ref 3.5–5.1)
Sodium: 144 mmol/L (ref 135–145)
Total Bilirubin: 0.4 mg/dL (ref 0.3–1.2)
Total Protein: 6.6 g/dL (ref 6.5–8.1)

## 2018-12-21 LAB — TOTAL PROTEIN, URINE DIPSTICK: PROTEIN: 30 mg/dL — AB

## 2018-12-21 LAB — TSH: TSH: 2.819 u[IU]/mL (ref 0.308–3.960)

## 2018-12-21 MED ORDER — MAGIC MOUTHWASH W/LIDOCAINE: 5.0000 mL | Freq: Four times a day (QID) | ORAL | 0 refills | Status: AC | PRN

## 2018-12-21 MED ORDER — HYDROCODONE-ACETAMINOPHEN 5-325 MG PO TABS: 1.0000 | ORAL_TABLET | Freq: Four times a day (QID) | ORAL | 0 refills | Status: AC | PRN

## 2018-12-21 MED FILL — INLYTA 5 MG TABLET: 5 | 30 days supply | Qty: 60 | Fill #0

## 2018-12-21 NOTE — Progress Notes (Signed)
78 year old female diagnosed with metastatic renal cell carcinoma.  She is a patient of Dr. Alen Blew.  Past medical history includes hypertension and GERD.  Medications include vitamin D3, Lasix, Magic mouthwash,.  Labs include albumin 3.1 today.  Height: 5 feet 2 inches. Weight: 176 pounds. Usual body weight: 180 pounds in October 2019. BMI: 32.21.  I met with patient, her son and daughter-in-law in the exam room. Patient reports she eats 2 meals daily and is not willing to add a third meal. States she eats when she is hungry and that varies day to day. Reports she eats cereal, egg, applesauce and orange juice at breakfast.  At dinner she consumes a sandwich or soup or spaghetti.  She tried oral nutrition supplements but reports they are too thick.  Dietary recall reveals patient consumes approximately 750 cal daily. She complains of mouth sores.  Nutrition diagnosis:  Inadequate oral intake related to metastatic cancer as evidenced by poor oral intake which is less than required needs.  Estimated nutrition needs: 1800-2000 cal, 100-110 g protein, two-point liters fluid.  Intervention: Educated patient to try to incorporate small snacks between 2 meals. Reviewed strategies for increasing calories and protein. Educated patient on the role of protein in her diet. Offered suggestions on thinning down oral nutrition supplements and suggested Carnation breakfast essentials may be well-tolerated. Provided fact sheets and coupons. Patient and family did not have any questions.  I provided contact information.  Monitoring, evaluation, goals: Patient will work to increase overall calories and protein to minimize loss of lean body mass.  No follow-up is scheduled.  Patient wishes to call if she needs further nutrition interventions.  **Disclaimer: This note was dictated with voice recognition software. Similar sounding words can inadvertently be transcribed and this note may contain  transcription errors which may not have been corrected upon publication of note.**

## 2018-12-21 NOTE — Progress Notes (Signed)
Hematology and Oncology Follow Up Visit  Eileen Norris [id number] 1941/06/10 78 y.o. 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  She presented with right kidney mass and metastatic disease to the lung and bone.    Prior Therapy:  He is status post percutaneous biopsy of a right iliac metastatic lesion on 09/02/2018.  C7 stereotactic radiosurgery completed on 09/14/2018 under the care of Dr. Sherwood Norris and Dr. Lisbeth Norris.  Cabometyx 20 mg started September 17, 2018.  Current therapy: Axitinib 5 mg twice a day started around December 30 of 2019.  Interim History: Eileen Norris is here for repeat evaluation.  Since last visit, she reports no major changes in her health.  She does report improvement in her lower extremity edema and overall improvement in her quality of life with axitinib.  She is still overall weak however with very limited mobility.  She has reported some occasional urinary incontinence but otherwise no major changes in her health.  Her appetite fluctuates at that time eating better certain days but also worse at times.  She denies any falls or syncope.  She does use a walker to transfer.  She continues to have chronic right-sided chest wall and flank pain which is manageable with hydrocodone.  She moves her bowels daily.   She does not report any headaches, blurry vision, syncope or seizures.  She denies any confusion or lethargy.  Does not report any fevers, chills or sweats.  Does not report any cough, wheezing or hemoptysis.  Does not report any chest pain, palpitation, orthopnea. Does not report any nausea, vomiting or early satiety.  Does not report any changes in bowel habits. Does not report any arthralgias or myalgias.  Denies any hematuria or dysuria.  Does not report any bleeding or clotting tendency.  Denies any skin rashes or lesions.  Does not report any injuries in mood.   Remaining review of systems is negative.    Medications: I have reviewed the patient's current medications.  Current Outpatient Medications  Medication Sig Dispense Refill  . amLODipine (NORVASC) 10 MG tablet Take 1 tablet (10 mg total) by mouth daily. 30 tablet 1  . Cholecalciferol (VITAMIN D-3) 1000 units CAPS Take 1,000 Units by mouth daily.    . ferrous sulfate 325 (65 FE) MG EC tablet Take 325 mg by mouth daily with breakfast.    . furosemide (LASIX) 20 MG tablet 1 once daily, increase to 1 BID for 3 days if edema worsens (Patient taking differently: Take 20 mg by mouth daily. ) 60 tablet 1  . HYDROcodone-acetaminophen (NORCO) 5-325 MG tablet Take 1 tablet by mouth every 6 (six) hours as needed for moderate pain. 40 tablet 0  . INLYTA 5 MG tablet TAKE 1 TABLET (5 MG TOTAL) BY MOUTH 2 (TWO) TIMES DAILY. 60 tablet 0  . metoprolol (LOPRESSOR) 50 MG tablet Take 50 mg by mouth 2 (two) times daily.      . mirtazapine (REMERON) 7.5 MG tablet Take 1 tablet (7.5 mg total) by mouth at bedtime. 30 tablet 2  . traMADol (ULTRAM) 50 MG tablet Take 1 tablet (50 mg total) by mouth every 6 (six) hours as needed. (Patient not taking: Reported on 11/09/2018) 30 tablet 1   No current facility-administered medications for this visit.      Allergies:  Allergies  Allergen Reactions  . Advil [Ibuprofen] Other (See Comments)    "Gives me  the shakes"  . Prilosec [Omeprazole] Other (See Comments)    Constipation    Past Medical History, Surgical history, Social history, and Family History were reviewed and updated.  Review of Systems:    Physical Exam:  Blood pressure (!) 177/83, pulse 70, temperature 98.2 F (36.8 C), temperature source Oral, resp. rate 18, height 5\' 2"  (1.575 m), weight 176 lb 1.6 oz (79.9 kg), SpO2 99 %.     ECOG:  2     General appearance: Comfortable appearing without any discomfort Head: Normocephalic without any trauma Oropharynx: Mucous membranes are moist and  pink without any thrush or ulcers. Eyes: Pupils are equal and round reactive to light. Lymph nodes: No cervical, supraclavicular, inguinal or axillary lymphadenopathy.   Heart:regular rate and rhythm.  S1 and S2.  1+ edema noted bilaterally. Lung: Clear without any rhonchi or wheezes.  No dullness to percussion. Abdomin: Soft, nontender, nondistended with good bowel sounds.  No hepatosplenomegaly. Musculoskeletal: No joint deformity or effusion.  Full range of motion noted. Neurological: No deficits noted on motor, sensory and deep tendon reflex exam. Skin: No petechial rash or dryness.  Appeared moist.         Lab Results: Lab Results  Component Value Date   WBC 4.5 11/20/2018   HGB 10.9 (L) 11/20/2018   HCT 35.4 (L) 11/20/2018   MCV 95.7 11/20/2018   PLT 244 11/20/2018     Chemistry      Component Value Date/Time   NA 146 (H) 11/20/2018 0843   NA 144 11/30/2011 0418   K 4.2 11/20/2018 0843   K 3.6 11/30/2011 0418   CL 109 11/20/2018 0843   CL 105 11/30/2011 0418   CO2 29 11/20/2018 0843   CO2 29 11/30/2011 0418   BUN 14 11/20/2018 0843   BUN 19 (H) 11/30/2011 0418   CREATININE 0.71 11/20/2018 0843   CREATININE 0.98 11/30/2011 0418      Component Value Date/Time   CALCIUM 8.9 11/20/2018 0843   CALCIUM 8.6 11/30/2011 0418   ALKPHOS 106 11/20/2018 0843   ALKPHOS 70 11/28/2011 2152   AST 16 11/20/2018 0843   ALT 11 11/20/2018 0843   ALT 23 11/28/2011 2152   BILITOT 0.3 11/20/2018 0843     CLINICAL DATA:  Renal cell carcinoma with osseous metastatic disease, status post C7 stereotactic radiosurgery in October.  EXAM: MRI CERVICAL SPINE WITHOUT AND WITH CONTRAST  TECHNIQUE: Multiplanar and multiecho pulse sequences of the cervical spine, to include the craniocervical junction and cervicothoracic junction, were obtained without and with intravenous contrast.  CONTRAST:  9 mL Gadavist intravenous contrast.  COMPARISON:  Cervical spine x-rays dated  October 12, 2018. MRI cervical spine dated September 03, 2018.  FINDINGS: Alignment: Unchanged slight reversal of the normal cervical lordosis. Unchanged trace retrolisthesis at C2-C3.  Vertebrae: Prior C3-C4 ACDF. Unchanged pathologic C7 compression fracture. Enhancing tumor within the prevertebral body extending into both posterior elements has decreased in size, as has the ventral epidural extension and bilateral C6-C7 and C7-T1 neuroforaminal extension. No new lesion. Remaining vertebral body heights are preserved. No evidence of discitis.  Cord: Unchanged small focus of chronic myelomalacia at C3-C4. Remaining spinal cord signal intensity is normal.  Posterior Fossa, vertebral arteries, paraspinal tissues: Large right thyroid mass is decreased in size, now measuring 2.9 cm, previously 3.8 cm. Large left pleural effusion. Mucous retention cyst in the right maxillary sinus.  Disc levels:  C2-C3:  Unchanged mild left uncovertebral hypertrophy.  No stenosis.  C3-C4: Prior  ACDF. Residual severe left and mild right neuroforaminal stenosis due to facet uncovertebral hypertrophy. No spinal canal stenosis.  C4-C5: Unchanged mild disc bulge and bilateral facet uncovertebral hypertrophy. Unchanged moderate bilateral neuroforaminal stenosis. No spinal canal stenosis.  C5-C6: Unchanged disc bulging and bilateral facet uncovertebral hypertrophy. Unchanged borderline mild spinal canal stenosis. Unchanged severe left and mild moderate right neuroforaminal stenosis.  C6-C7: Unchanged right-sided retropulsion and endplate spurring. Improved now mild central spinal canal stenosis. Unchanged severe left neuroforaminal stenosis. Improved now mild to moderate right neuroforaminal stenosis.  C7-T1: Improved now moderate bilateral neuroforaminal stenosis secondary to decreased tumor burden. No spinal canal stenosis.  IMPRESSION: 1. Overall decreased C7 tumor burden with  reduced ventral epidural and bilateral C6-C7/C7-T1 neuroforaminal tumor extension. 2. Improved now mild central spinal canal stenosis and mild-to-moderate right neuroforaminal stenosis at C6-C7. Unchanged severe left neuroforaminal stenosis. 3. Improved now moderate bilateral neuroforaminal stenosis at C7-T1. 4. Unchanged severe left neuroforaminal stenosis at C3-C4 and C5-C6. 5. Interval decrease in size of the right thyroid mass, now measuring 2.9 cm, previously 3.8 cm. 6. Large left pleural effusion.     Impression and Plan:  78 year old woman with the following:  1.  Renal cell carcinoma diagnosed in October 2019.  She has right renal mass and metastatic disease to the bone or lung.     She started axitinib 5 mg twice a day which she has taken for close to a month.  She has tolerated therapy reasonably well without any major complications.  Risks and benefits of continuing this therapy long-term was reviewed.  She is agreeable to continue at this time I will repeat imaging studies in 1 month.   2.  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  MRI of the spine obtained on December 18, 2018 was personally reviewed and showed overall reduction of her tumor.  Her case will be discussed in tumor board on December 23, 2018 for final recommendation.  3.  Prognosis and goals of care: Her disease is incurable and rather aggressive.  Her overall prognosis is poor although she is still desiring aggressive treatment.  4.  Pain: controlled with hydrocodone which was refilled for her today.  5.  Lower extremity edema: Improved with diuretics.  6.  Generalized weakness and periodic incontinence: Likely related to her cancer and cancer treatment.  Doing studies in December 2019 did not show any other spinal metastasis.  We will repeat imaging studies in 4 weeks to continue to confirm that.  7.  Nutritional consideration: She has not been taking  Remeron.  We can consider Megace as an alternative although her risk of DVT would be high given her mobility.  She will consult with a nutritionist today.  8.  Anemia: Her hemoglobin is stable I recommend discontinuation of iron given her periodic issues with constipation.  9.  Follow-up: We will be in 4 weeks to follow her progress.  25  minutes was spent with the patient face-to-face today.  More than 50% of time was dedicated to reviewing imaging studies, laboratory data and answering questions regarding future plan of care.   Zola Button, MD 1/27/20209:22 AM

## 2018-12-21 NOTE — Telephone Encounter (Signed)
Printed avs and calender of upcoming appointment. Per 1/27 los Gave patient contrast,instructions, and CT information.

## 2018-12-22 DIAGNOSIS — I509 Heart failure, unspecified: Secondary | ICD-10-CM | POA: Diagnosis not present

## 2018-12-22 DIAGNOSIS — C7951 Secondary malignant neoplasm of bone: Secondary | ICD-10-CM | POA: Diagnosis not present

## 2018-12-22 DIAGNOSIS — R269 Unspecified abnormalities of gait and mobility: Secondary | ICD-10-CM | POA: Diagnosis not present

## 2018-12-22 DIAGNOSIS — N2889 Other specified disorders of kidney and ureter: Secondary | ICD-10-CM | POA: Diagnosis not present

## 2018-12-23 ENCOUNTER — Ambulatory Visit: Payer: Self-pay | Admitting: Radiation Oncology

## 2018-12-23 DIAGNOSIS — C7951 Secondary malignant neoplasm of bone: Secondary | ICD-10-CM | POA: Diagnosis not present

## 2018-12-24 DIAGNOSIS — Z87311 Personal history of (healed) other pathological fracture: Secondary | ICD-10-CM | POA: Diagnosis not present

## 2018-12-24 DIAGNOSIS — R600 Localized edema: Secondary | ICD-10-CM | POA: Diagnosis not present

## 2018-12-24 DIAGNOSIS — R296 Repeated falls: Secondary | ICD-10-CM | POA: Diagnosis not present

## 2018-12-24 DIAGNOSIS — C649 Malignant neoplasm of unspecified kidney, except renal pelvis: Secondary | ICD-10-CM | POA: Diagnosis not present

## 2018-12-24 DIAGNOSIS — C7951 Secondary malignant neoplasm of bone: Secondary | ICD-10-CM | POA: Diagnosis not present

## 2018-12-24 DIAGNOSIS — I10 Essential (primary) hypertension: Secondary | ICD-10-CM | POA: Diagnosis not present

## 2018-12-24 DIAGNOSIS — Z96653 Presence of artificial knee joint, bilateral: Secondary | ICD-10-CM | POA: Diagnosis not present

## 2018-12-29 DIAGNOSIS — Z87311 Personal history of (healed) other pathological fracture: Secondary | ICD-10-CM | POA: Diagnosis not present

## 2018-12-29 DIAGNOSIS — I10 Essential (primary) hypertension: Secondary | ICD-10-CM | POA: Diagnosis not present

## 2018-12-29 DIAGNOSIS — Z96653 Presence of artificial knee joint, bilateral: Secondary | ICD-10-CM | POA: Diagnosis not present

## 2018-12-29 DIAGNOSIS — C7951 Secondary malignant neoplasm of bone: Secondary | ICD-10-CM | POA: Diagnosis not present

## 2018-12-29 DIAGNOSIS — R600 Localized edema: Secondary | ICD-10-CM | POA: Diagnosis not present

## 2018-12-29 DIAGNOSIS — R296 Repeated falls: Secondary | ICD-10-CM | POA: Diagnosis not present

## 2018-12-29 DIAGNOSIS — C649 Malignant neoplasm of unspecified kidney, except renal pelvis: Secondary | ICD-10-CM | POA: Diagnosis not present

## 2019-01-06 DIAGNOSIS — C7951 Secondary malignant neoplasm of bone: Secondary | ICD-10-CM | POA: Diagnosis not present

## 2019-01-06 DIAGNOSIS — I10 Essential (primary) hypertension: Secondary | ICD-10-CM | POA: Diagnosis not present

## 2019-01-06 DIAGNOSIS — Z96653 Presence of artificial knee joint, bilateral: Secondary | ICD-10-CM | POA: Diagnosis not present

## 2019-01-06 DIAGNOSIS — R600 Localized edema: Secondary | ICD-10-CM | POA: Diagnosis not present

## 2019-01-06 DIAGNOSIS — C649 Malignant neoplasm of unspecified kidney, except renal pelvis: Secondary | ICD-10-CM | POA: Diagnosis not present

## 2019-01-06 DIAGNOSIS — R296 Repeated falls: Secondary | ICD-10-CM | POA: Diagnosis not present

## 2019-01-06 DIAGNOSIS — Z87311 Personal history of (healed) other pathological fracture: Secondary | ICD-10-CM | POA: Diagnosis not present

## 2019-01-11 ENCOUNTER — Other Ambulatory Visit: Payer: Self-pay | Admitting: Oncology

## 2019-01-11 DIAGNOSIS — M19079 Primary osteoarthritis, unspecified ankle and foot: Secondary | ICD-10-CM | POA: Diagnosis not present

## 2019-01-11 DIAGNOSIS — C649 Malignant neoplasm of unspecified kidney, except renal pelvis: Secondary | ICD-10-CM

## 2019-01-11 DIAGNOSIS — I471 Supraventricular tachycardia: Secondary | ICD-10-CM | POA: Diagnosis not present

## 2019-01-11 DIAGNOSIS — C7951 Secondary malignant neoplasm of bone: Secondary | ICD-10-CM | POA: Diagnosis not present

## 2019-01-11 DIAGNOSIS — I10 Essential (primary) hypertension: Secondary | ICD-10-CM | POA: Diagnosis not present

## 2019-01-11 DIAGNOSIS — C641 Malignant neoplasm of right kidney, except renal pelvis: Secondary | ICD-10-CM | POA: Diagnosis not present

## 2019-01-11 DIAGNOSIS — T8489XD Other specified complication of internal orthopedic prosthetic devices, implants and grafts, subsequent encounter: Secondary | ICD-10-CM | POA: Diagnosis not present

## 2019-01-11 DIAGNOSIS — B369 Superficial mycosis, unspecified: Secondary | ICD-10-CM | POA: Diagnosis not present

## 2019-01-14 ENCOUNTER — Inpatient Hospital Stay: Payer: Medicare Other | Attending: Oncology

## 2019-01-14 ENCOUNTER — Ambulatory Visit (HOSPITAL_COMMUNITY)
Admission: RE | Admit: 2019-01-14 | Discharge: 2019-01-14 | Disposition: A | Discharge status: Home / Self Care | Payer: Medicare Other | Source: Ambulatory Visit | Attending: Oncology | Admitting: Oncology

## 2019-01-14 DIAGNOSIS — J910 Malignant pleural effusion: Secondary | ICD-10-CM | POA: Insufficient documentation

## 2019-01-14 DIAGNOSIS — R600 Localized edema: Secondary | ICD-10-CM | POA: Diagnosis not present

## 2019-01-14 DIAGNOSIS — C641 Malignant neoplasm of right kidney, except renal pelvis: Secondary | ICD-10-CM | POA: Diagnosis not present

## 2019-01-14 DIAGNOSIS — Z923 Personal history of irradiation: Secondary | ICD-10-CM | POA: Diagnosis not present

## 2019-01-14 DIAGNOSIS — C649 Malignant neoplasm of unspecified kidney, except renal pelvis: Secondary | ICD-10-CM | POA: Insufficient documentation

## 2019-01-14 DIAGNOSIS — D649 Anemia, unspecified: Secondary | ICD-10-CM | POA: Diagnosis not present

## 2019-01-14 DIAGNOSIS — C7951 Secondary malignant neoplasm of bone: Secondary | ICD-10-CM | POA: Diagnosis not present

## 2019-01-14 DIAGNOSIS — Z79899 Other long term (current) drug therapy: Secondary | ICD-10-CM | POA: Insufficient documentation

## 2019-01-14 DIAGNOSIS — R599 Enlarged lymph nodes, unspecified: Secondary | ICD-10-CM | POA: Diagnosis not present

## 2019-01-14 DIAGNOSIS — C7800 Secondary malignant neoplasm of unspecified lung: Secondary | ICD-10-CM | POA: Diagnosis not present

## 2019-01-14 DIAGNOSIS — I700 Atherosclerosis of aorta: Secondary | ICD-10-CM | POA: Diagnosis not present

## 2019-01-14 LAB — CBC WITH DIFFERENTIAL (CANCER CENTER ONLY)
Abs Immature Granulocytes: 0.01 10*3/uL (ref 0.00–0.07)
BASOS PCT: 1 %
Basophils Absolute: 0 10*3/uL (ref 0.0–0.1)
EOS ABS: 0.1 10*3/uL (ref 0.0–0.5)
Eosinophils Relative: 2 %
HCT: 40 % (ref 36.0–46.0)
Hemoglobin: 12.5 g/dL (ref 12.0–15.0)
Immature Granulocytes: 0 %
Lymphocytes Relative: 46 %
Lymphs Abs: 1.8 10*3/uL (ref 0.7–4.0)
MCH: 30.1 pg (ref 26.0–34.0)
MCHC: 31.3 g/dL (ref 30.0–36.0)
MCV: 96.4 fL (ref 80.0–100.0)
Monocytes Absolute: 0.4 10*3/uL (ref 0.1–1.0)
Monocytes Relative: 9 %
Neutro Abs: 1.7 10*3/uL (ref 1.7–7.7)
Neutrophils Relative %: 42 %
PLATELETS: 256 10*3/uL (ref 150–400)
RBC: 4.15 MIL/uL (ref 3.87–5.11)
RDW: 14.3 % (ref 11.5–15.5)
WBC Count: 4 10*3/uL (ref 4.0–10.5)
nRBC: 0 % (ref 0.0–0.2)

## 2019-01-14 LAB — CMP (CANCER CENTER ONLY)
ALT: 6 U/L (ref 0–44)
AST: 16 U/L (ref 15–41)
Albumin: 3.2 g/dL — ABNORMAL LOW (ref 3.5–5.0)
Alkaline Phosphatase: 94 U/L (ref 38–126)
Anion gap: 10 (ref 5–15)
BUN: 9 mg/dL (ref 8–23)
CO2: 28 mmol/L (ref 22–32)
Calcium: 9.6 mg/dL (ref 8.9–10.3)
Chloride: 104 mmol/L (ref 98–111)
Creatinine: 0.69 mg/dL (ref 0.44–1.00)
GFR, Est AFR Am: >60 mL/min (ref >60)
GFR, Estimated: >60 mL/min (ref >60)
Glucose, Bld: 102 mg/dL — ABNORMAL HIGH (ref 70–99)
Potassium: 4.1 mmol/L (ref 3.5–5.1)
Sodium: 142 mmol/L (ref 135–145)
Total Bilirubin: 0.3 mg/dL (ref 0.3–1.2)
Total Protein: 6.9 g/dL (ref 6.5–8.1)

## 2019-01-14 MED ORDER — SODIUM CHLORIDE (PF) 0.9 % IJ SOLN
INTRAMUSCULAR | Status: AC
  Filled 2019-01-14: qty 50

## 2019-01-14 MED ORDER — IOHEXOL 300 MG/ML  SOLN
100.0000 mL | Freq: Once | INTRAMUSCULAR | Status: AC | PRN
  Administered 2019-01-14: 100 mL via INTRAVENOUS

## 2019-01-14 NOTE — Progress Notes (Signed)
PIV consult: Arrived to radiology, staff had already established a site. Cancel consult.

## 2019-01-15 ENCOUNTER — Inpatient Hospital Stay: Payer: Medicare Other

## 2019-01-18 ENCOUNTER — Telehealth: Payer: Self-pay | Admitting: Oncology

## 2019-01-18 ENCOUNTER — Inpatient Hospital Stay (HOSPITAL_BASED_OUTPATIENT_CLINIC_OR_DEPARTMENT_OTHER): Payer: Medicare Other | Admitting: Oncology

## 2019-01-18 VITALS — BP 166/80 | HR 69 | Temp 97.8°F | Resp 18 | Ht 62.0 in | Wt 173.8 lb

## 2019-01-18 DIAGNOSIS — R600 Localized edema: Secondary | ICD-10-CM

## 2019-01-18 DIAGNOSIS — D649 Anemia, unspecified: Secondary | ICD-10-CM

## 2019-01-18 DIAGNOSIS — C7800 Secondary malignant neoplasm of unspecified lung: Secondary | ICD-10-CM | POA: Diagnosis not present

## 2019-01-18 DIAGNOSIS — C7951 Secondary malignant neoplasm of bone: Secondary | ICD-10-CM | POA: Diagnosis not present

## 2019-01-18 DIAGNOSIS — C649 Malignant neoplasm of unspecified kidney, except renal pelvis: Secondary | ICD-10-CM

## 2019-01-18 DIAGNOSIS — Z923 Personal history of irradiation: Secondary | ICD-10-CM | POA: Diagnosis not present

## 2019-01-18 DIAGNOSIS — Z79899 Other long term (current) drug therapy: Secondary | ICD-10-CM

## 2019-01-18 DIAGNOSIS — J910 Malignant pleural effusion: Secondary | ICD-10-CM | POA: Diagnosis not present

## 2019-01-18 DIAGNOSIS — C641 Malignant neoplasm of right kidney, except renal pelvis: Secondary | ICD-10-CM | POA: Diagnosis not present

## 2019-01-18 MED ORDER — OXYCODONE HCL 5 MG PO TABS: 5.0000 mg | ORAL_TABLET | ORAL | 0 refills | Status: DC | PRN

## 2019-01-18 MED FILL — INLYTA 5 MG TABLET: 5 | 30 days supply | Qty: 60 | Fill #0

## 2019-01-18 NOTE — Telephone Encounter (Signed)
Gave avs and calendar ° °

## 2019-01-18 NOTE — Progress Notes (Signed)
Hematology and Oncology Follow Up Visit  Eileen Norris [id number] 1941/04/21 78 y.o. 01/18/2019 10:22 AM [name], MDMasoud, [name], MD   Principle Diagnosis: 78 year old woman with renal cell carcinoma diagnosed in October 2019.  She was found to have stage IV with lung and bone involvement.  Prior Therapy:  He is status post percutaneous biopsy of a right iliac metastatic lesion on 09/02/2018.  C7 stereotactic radiosurgery completed on 09/14/2018 under the care of Dr. Sherwood Gambler and Dr. Lisbeth Renshaw.  Cabometyx 20 mg started September 17, 2018.  Current therapy: Axitinib 5 mg twice a day started around December 30 of 2019.  Interim History: Eileen Norris presents today for a follow-up.  Since the last visit, she reports no major changes in her health.  She is reporting slight improvement in her mobility and decrease in her lower extremity edema.  She continues to tolerate axitinib without any new issues.  She denies excessive fatigue, tiredness.  She denies any recent hospitalizations or illnesses.  Her pain continues to be an issue predominantly in her neck and right side.  She does take hydrocodone which has been erratic in effectiveness.  Her overall appetite has improved slightly.   Patient denied any alteration mental status, neuropathy, confusion or dizziness.  Denies any headaches or lethargy.  Denies any night sweats, weight loss or changes in appetite.  Denied orthopnea, dyspnea on exertion or chest discomfort.  Denies shortness of breath, difficulty breathing hemoptysis or cough.  Denies any abdominal distention, nausea, early satiety or dyspepsia.  Denies any hematuria, frequency, dysuria or nocturia.  Denies any skin irritation, dryness or rash.  Denies any ecchymosis or petechiae.  Denies any lymphadenopathy or clotting.  Denies any heat or cold intolerance.  Denies any anxiety or depression.  Remaining review of system is negative.     Medications: I have reviewed the patient's current  medications.  Current Outpatient Medications  Medication Sig Dispense Refill  . amLODipine (NORVASC) 10 MG tablet Take 1 tablet (10 mg total) by mouth daily. 30 tablet 1  . Cholecalciferol (VITAMIN D-3) 1000 units CAPS Take 1,000 Units by mouth daily.    . furosemide (LASIX) 20 MG tablet 1 once daily, increase to 1 BID for 3 days if edema worsens (Patient taking differently: Take 20 mg by mouth daily. ) 60 tablet 1  . HYDROcodone-acetaminophen (NORCO) 5-325 MG tablet Take 1 tablet by mouth every 6 (six) hours as needed for moderate pain. 40 tablet 0  . INLYTA 5 MG tablet TAKE 1 TABLET (5 MG TOTAL) BY MOUTH 2 (TWO) TIMES DAILY. 60 tablet 0  . magic mouthwash w/lidocaine SOLN Take 5 mLs by mouth 4 (four) times daily as needed for mouth pain. 240 mL 0  . metoprolol (LOPRESSOR) 50 MG tablet Take 50 mg by mouth 2 (two) times daily.      . traMADol (ULTRAM) 50 MG tablet Take 1 tablet (50 mg total) by mouth every 6 (six) hours as needed. (Patient not taking: Reported on 11/09/2018) 30 tablet 1   No current facility-administered medications for this visit.      Allergies:  Allergies  Allergen Reactions  . Advil [Ibuprofen] Other (See Comments)    "Gives me the shakes"  . Prilosec [Omeprazole] Other (See Comments)    Constipation    Past Medical History, Surgical history, Social history, and Family History were reviewed and updated.  Review of Systems:    Physical Exam:   Blood pressure (!) 166/80, pulse 69, temperature 97.8 F (36.6 C),  temperature source Oral, resp. rate 18, height 5\' 2"  (1.575 m), weight 173 lb 12.8 oz (78.8 kg), SpO2 95 %.     ECOG:  2     General appearance: Alert, awake without any distress. Head: Atraumatic without abnormalities Oropharynx: Without any thrush or ulcers. Eyes: No scleral icterus. Lymph nodes: No lymphadenopathy noted in the cervical, supraclavicular, or axillary nodes Heart:regular rate and rhythm, without any murmurs or gallops.     Bilateral lower extremity edema noted. Lung: Clear to auscultation without any rhonchi, wheezes or dullness to percussion.  Decreased breath sound on the left. Abdomin: Soft, nontender without any shifting dullness or ascites. Musculoskeletal: No clubbing or cyanosis. Neurological: No motor or sensory deficits. Skin: No rashes or lesions.         Lab Results: Lab Results  Component Value Date   WBC 4.0 01/14/2019   HGB 12.5 01/14/2019   HCT 40.0 01/14/2019   MCV 96.4 01/14/2019   PLT 256 01/14/2019     Chemistry      Component Value Date/Time   NA 142 01/14/2019 0811   NA 144 11/30/2011 0418   K 4.1 01/14/2019 0811   K 3.6 11/30/2011 0418   CL 104 01/14/2019 0811   CL 105 11/30/2011 0418   CO2 28 01/14/2019 0811   CO2 29 11/30/2011 0418   BUN 9 01/14/2019 0811   BUN 19 (H) 11/30/2011 0418   CREATININE 0.69 01/14/2019 0811   CREATININE 0.98 11/30/2011 0418      Component Value Date/Time   CALCIUM 9.6 01/14/2019 0811   CALCIUM 8.6 11/30/2011 0418   ALKPHOS 94 01/14/2019 0811   ALKPHOS 70 11/28/2011 2152   AST 16 01/14/2019 0811   ALT 6 01/14/2019 0811   ALT 23 11/28/2011 2152   BILITOT 0.3 01/14/2019 0811     CLINICAL DATA:  Metastatic renal cell carcinoma. Right hip and leg pain.  EXAM: CT CHEST WITH CONTRAST  CT ABDOMEN AND PELVIS WITH AND WITHOUT CONTRAST  TECHNIQUE: Multidetector CT imaging of the chest was performed during intravenous contrast administration. Multidetector CT imaging of the abdomen and pelvis was performed following the standard protocol before and during bolus administration of intravenous contrast.  CONTRAST:  138mL OMNIPAQUE IOHEXOL 300 MG/ML  SOLN  COMPARISON:  Multiple exams, including 11/20/2018  FINDINGS: CT CHEST FINDINGS  Cardiovascular: Thoracic aortic atherosclerotic vascular disease.  Mediastinum/Nodes: Lower paratracheal node anterior to the carina 1.7 cm in short axis on image 29/9, formerly 1.3 cm.  Right upper paraesophageal node 0.9 cm in short axis on image 13/9, previously 0.5 cm. Left prevascular lymph node 1.0 cm in short axis on image 20/9, previously 0.9 cm. AP window lymph node 1.5 cm in short axis on image 31/9, previously 0.8 cm. Subcarinal node 1.9 cm in short axis on image 37/9, formerly 1.2 cm. Right hilar node 2.0 cm in short axis on image 38/9, formerly 1.6 cm. Bilateral infrahilar and left hilar adenopathy noted.  Hypodense right thyroid nodule 2.9 by 2.2 cm, formerly 3.6 by 2.8 cm.  Left internal mammary node 1.0 cm in short axis on image 38/9, formerly 1.2 cm.  Lungs/Pleura: Large left pleural effusion with some enhancement along the left parietal pleural surface concerning for malignant effusion. There is also nodular enhancement anteriorly along the left cardiophrenic angle on image 61/9. Enhancing pleural tumor in the left major fissure is increased from prior.  Small right pleural effusion, mildly increased from prior.  Scattered bilateral pulmonary nodules are again observed and some  are mildly cavitary. An index lingular nodule measures 1.1 by 1.1 cm on image 67/11 and previously measured 1.5 by 1.2 cm. An index right upper lobe pulmonary nodule demonstrates increased cavitation and measures 0.9 by 0.8 cm on image 36/11, formerly 1.0 by 0.9 cm. Most of the nodules appear minimally smaller.  Musculoskeletal: Thoracic spondylosis.  CT ABDOMEN AND PELVIS FINDINGS  Hepatobiliary: Unremarkable  Pancreas: Unremarkable  Spleen: Unremarkable  Adrenals/Urinary Tract: Both adrenal glands appear unremarkable.  Exophytic centrally necrotic right kidney lower pole mass measures 5.3 by 3.7 cm on image 110/9, formerly 5.3 by 3.8 cm. Multiple hypodense lesions of both kidneys are likely cysts. Mild enhancement in the wall of the right renal collecting system, not appreciably changed  Stomach/Bowel: Questionable wall thickening in the gastric  fundus. Otherwise unremarkable.  Vascular/Lymphatic: Aortoiliac atherosclerotic vascular disease. No definite tumor thrombus in the right renal vein. Retrocaval node 1.0 cm in short axis on image 105/9, previously 1.2 cm by my measurements. Indistinct. A caval node 1.0 cm in short axis on image 123/9, stable.  Reproductive: Uterus absent.  Adnexa unremarkable.  Other: Subcutaneous edema noted.  Musculoskeletal: Metastatic lesions of the iliac bone and right pubic bone are again observed. The right iliac bone mass measures 4.2 by 3.3 cm, formerly 5.0 by 3.3 cm. I do not see any new bony lesions. Posterolateral rod and pedicle screw fixation at L3-L4-L5.  IMPRESSION: 1. Mixed appearance, with worsened adenopathy in the chest and worsened left pleural disease in the chest; but mildly reduced size of the pulmonary nodules, stable size of the right kidney lower pole renal cell carcinoma, and mildly reduced size of the osseous metastatic lesions. 2.  Aortic Atherosclerosis (ICD10-I70.0).   Impression and Plan:  78 year old woman with the following:  1.  Stage IV renal cell carcinoma with disease to the bone and lung diagnosed in October 2019.   She has tolerated axitinib reasonably well without any major complications since January 2020.  CT scan obtained on January 14, 2019 was discussed today and showed mixed responses but we are seeing some objective responses with reduction in her bone disease and kidney mass.  Risks and benefits of continuing this therapy versus switching to different agent was discussed today.  At this time I recommended continuing with the same dose and schedule.  She is agreeable to continue.   2.  Cervical spine metastasis: Status post radiation therapy with improvement in her disease based on MRI in January 2020.  3.  Prognosis and goals of care: Therapy remains palliative although she continues to desire aggressive therapy.  I recommended continuing  with aggressive therapy at this time.  4.  Pain: I will switch her hydrocodone to oxycodone with instructions how to use it.  We also discussed management of constipation with increasing fiber intake.  5.  Lower extremity edema: Currently on Lasix with slight improvement.  6.  Anemia: Improved at this time with hemoglobin back to normal range.  7.  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  I will refer her to interventional radiology for a thoracentesis and possibly Pleurx catheter if this effusion reaccumulate quickly.  She is agreeable to proceed at this time.  Complications associated with this procedure including pain, bleeding, infection and pneumothorax.  8.  Follow-up: We will be in 4 weeks for repeat evaluation.  25  minutes was spent with the patient face-to-face today.  More than 50% of time was dedicated to discussing her disease status, imaging studies, treatment options and  answering questions regarding complications related to her cancer and cancer treatment.   Zola Button, MD 2/24/202010:22 AM

## 2019-01-22 DIAGNOSIS — R269 Unspecified abnormalities of gait and mobility: Secondary | ICD-10-CM | POA: Diagnosis not present

## 2019-01-27 DIAGNOSIS — M76821 Posterior tibial tendinitis, right leg: Secondary | ICD-10-CM | POA: Diagnosis not present

## 2019-01-27 DIAGNOSIS — B351 Tinea unguium: Secondary | ICD-10-CM | POA: Diagnosis not present

## 2019-01-27 DIAGNOSIS — M7672 Peroneal tendinitis, left leg: Secondary | ICD-10-CM | POA: Diagnosis not present

## 2019-01-27 DIAGNOSIS — M79675 Pain in left toe(s): Secondary | ICD-10-CM | POA: Diagnosis not present

## 2019-01-27 DIAGNOSIS — M79674 Pain in right toe(s): Secondary | ICD-10-CM | POA: Diagnosis not present

## 2019-02-05 ENCOUNTER — Other Ambulatory Visit: Payer: Self-pay | Admitting: Oncology

## 2019-02-05 DIAGNOSIS — C649 Malignant neoplasm of unspecified kidney, except renal pelvis: Secondary | ICD-10-CM

## 2019-02-09 DIAGNOSIS — N2889 Other specified disorders of kidney and ureter: Secondary | ICD-10-CM | POA: Diagnosis not present

## 2019-02-09 DIAGNOSIS — I509 Heart failure, unspecified: Secondary | ICD-10-CM | POA: Diagnosis not present

## 2019-02-09 DIAGNOSIS — C7951 Secondary malignant neoplasm of bone: Secondary | ICD-10-CM | POA: Diagnosis not present

## 2019-02-19 ENCOUNTER — Other Ambulatory Visit: Payer: Self-pay

## 2019-02-19 ENCOUNTER — Telehealth: Payer: Self-pay | Admitting: *Deleted

## 2019-02-19 ENCOUNTER — Telehealth: Payer: Self-pay | Admitting: Oncology

## 2019-02-19 ENCOUNTER — Inpatient Hospital Stay: Payer: Medicare Other | Attending: Oncology | Admitting: Oncology

## 2019-02-19 ENCOUNTER — Inpatient Hospital Stay: Payer: Medicare Other

## 2019-02-19 ENCOUNTER — Other Ambulatory Visit: Payer: Self-pay | Admitting: *Deleted

## 2019-02-19 VITALS — BP 151/96 | HR 88 | Temp 98.2°F | Resp 18 | Ht 62.0 in | Wt 167.5 lb

## 2019-02-19 DIAGNOSIS — C649 Malignant neoplasm of unspecified kidney, except renal pelvis: Secondary | ICD-10-CM

## 2019-02-19 DIAGNOSIS — C7800 Secondary malignant neoplasm of unspecified lung: Secondary | ICD-10-CM | POA: Diagnosis not present

## 2019-02-19 DIAGNOSIS — C7951 Secondary malignant neoplasm of bone: Secondary | ICD-10-CM | POA: Insufficient documentation

## 2019-02-19 DIAGNOSIS — C641 Malignant neoplasm of right kidney, except renal pelvis: Secondary | ICD-10-CM | POA: Insufficient documentation

## 2019-02-19 DIAGNOSIS — Z923 Personal history of irradiation: Secondary | ICD-10-CM

## 2019-02-19 DIAGNOSIS — D649 Anemia, unspecified: Secondary | ICD-10-CM | POA: Insufficient documentation

## 2019-02-19 DIAGNOSIS — R600 Localized edema: Secondary | ICD-10-CM | POA: Insufficient documentation

## 2019-02-19 DIAGNOSIS — Z79899 Other long term (current) drug therapy: Secondary | ICD-10-CM | POA: Diagnosis not present

## 2019-02-19 DIAGNOSIS — E7800 Pure hypercholesterolemia, unspecified: Secondary | ICD-10-CM | POA: Insufficient documentation

## 2019-02-19 LAB — CBC WITH DIFFERENTIAL (CANCER CENTER ONLY)
Abs Immature Granulocytes: 0 10*3/uL (ref 0.00–0.07)
Basophils Absolute: 0 10*3/uL (ref 0.0–0.1)
Basophils Relative: 0 %
Eosinophils Absolute: 0 10*3/uL (ref 0.0–0.5)
Eosinophils Relative: 1 %
HCT: 44.1 % (ref 36.0–46.0)
Hemoglobin: 13.8 g/dL (ref 12.0–15.0)
Immature Granulocytes: 0 %
Lymphocytes Relative: 44 %
Lymphs Abs: 2.3 10*3/uL (ref 0.7–4.0)
MCH: 28.9 pg (ref 26.0–34.0)
MCHC: 31.3 g/dL (ref 30.0–36.0)
MCV: 92.5 fL (ref 80.0–100.0)
Monocytes Absolute: 0.4 10*3/uL (ref 0.1–1.0)
Monocytes Relative: 9 %
Neutro Abs: 2.3 10*3/uL (ref 1.7–7.7)
Neutrophils Relative %: 46 %
Platelet Count: 264 10*3/uL (ref 150–400)
RBC: 4.77 MIL/uL (ref 3.87–5.11)
RDW: 13.3 % (ref 11.5–15.5)
WBC Count: 5.1 10*3/uL (ref 4.0–10.5)
nRBC: 0 % (ref 0.0–0.2)

## 2019-02-19 LAB — CMP (CANCER CENTER ONLY)
ALT: 8 U/L (ref 0–44)
AST: 12 U/L — ABNORMAL LOW (ref 15–41)
Albumin: 3 g/dL — ABNORMAL LOW (ref 3.5–5.0)
Alkaline Phosphatase: 89 U/L (ref 38–126)
Anion gap: 13 (ref 5–15)
BILIRUBIN TOTAL: 0.4 mg/dL (ref 0.3–1.2)
BUN: 14 mg/dL (ref 8–23)
CO2: 28 mmol/L (ref 22–32)
Calcium: 9.2 mg/dL (ref 8.9–10.3)
Chloride: 102 mmol/L (ref 98–111)
Creatinine: 0.7 mg/dL (ref 0.44–1.00)
GFR, Est AFR Am: >60 mL/min (ref >60)
GFR, Estimated: >60 mL/min (ref >60)
Glucose, Bld: 113 mg/dL — ABNORMAL HIGH (ref 70–99)
Potassium: 2.8 mmol/L — CL (ref 3.5–5.1)
Sodium: 143 mmol/L (ref 135–145)
TOTAL PROTEIN: 6.7 g/dL (ref 6.5–8.1)

## 2019-02-19 LAB — TSH: TSH: 6.098 u[IU]/mL — ABNORMAL HIGH (ref 0.308–3.960)

## 2019-02-19 MED ORDER — POTASSIUM CHLORIDE CRYS ER 20 MEQ PO TBCR: 20.0000 meq | EXTENDED_RELEASE_TABLET | Freq: Every day | ORAL | 0 refills | Status: AC

## 2019-02-19 MED ORDER — OXYCODONE HCL 5 MG PO TABS: 5.0000 mg | ORAL_TABLET | ORAL | 0 refills | Status: AC | PRN

## 2019-02-19 MED ORDER — MEGESTROL ACETATE 400 MG/10ML PO SUSP: 400.0000 mg | Freq: Two times a day (BID) | ORAL | 0 refills | Status: AC

## 2019-02-19 MED ORDER — MORPHINE SULFATE ER 15 MG PO TBCR: 15.0000 mg | EXTENDED_RELEASE_TABLET | Freq: Two times a day (BID) | ORAL | 0 refills | Status: AC

## 2019-02-19 MED FILL — INLYTA 5 MG TABLET: 5 | 30 days supply | Qty: 60 | Fill #0

## 2019-02-19 NOTE — Telephone Encounter (Signed)
Scheduled appt per 3/27 los.  Printed calendar and avs.  Gave patient contrast and the number to central radiology.

## 2019-02-19 NOTE — Progress Notes (Signed)
Hematology and Oncology Follow Up Visit  Eileen Norris [id number] 1940/12/09 78 y.o. 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    Prior Therapy:  He is status post percutaneous biopsy of a right iliac metastatic lesion on 09/02/2018.  C7 stereotactic radiosurgery completed on 09/14/2018 under the care of Dr. Sherwood Norris and Dr. Lisbeth Norris.  Cabometyx 20 mg started September 17, 2018.  Current therapy: Axitinib 5 mg twice a day started in December 30 of 2019.  Interim History: Eileen Norris returns today for a repeat evaluation.  Since the last visit, she reports few complaints predominantly increased pain on her right side.  Her pain is predominantly in the neck arm and flank which is chronic in nature but has intensified since the last visit.  She has been using oxycodone which helped temporarily but was not lasting long enough.  She is noticed overall limitation in her performance status and functionality but no recent hospitalizations or illnesses.  She continues to tolerate axitinib without any issues or concerns.  Her appetite has been fluctuating and have lost some weight.  She reports taste alteration which has hindered her ability to maintain adequate nutrition.   She denied headaches, blurry vision, syncope or seizures.  Denies any fevers, chills or sweats.  Denied chest pain, palpitation, orthopnea or leg edema.  Denied cough, wheezing or hemoptysis.  Denied nausea, vomiting or abdominal pain.  Denies any constipation or diarrhea.  Denies any frequency urgency or hesitancy.  Denies any arthralgias or myalgias.  Denies any skin rashes or lesions.  Denies any bleeding or clotting tendency.  Denies any easy bruising.  Denies any hair or nail changes.  Denies any anxiety or depression.  Remaining review of system is  negative.      Medications: I have reviewed the patient's current medications.  Current Outpatient Medications  Medication Sig Dispense Refill  . amLODipine (NORVASC) 10 MG tablet Take 1 tablet (10 mg total) by mouth daily. 30 tablet 1  . Cholecalciferol (VITAMIN D-3) 1000 units CAPS Take 1,000 Units by mouth daily.    . furosemide (LASIX) 20 MG tablet 1 once daily, increase to 1 BID for 3 days if edema worsens (Patient taking differently: Take 20 mg by mouth daily. ) 60 tablet 1  . HYDROcodone-acetaminophen (NORCO) 5-325 MG tablet Take 1 tablet by mouth every 6 (six) hours as needed for moderate pain. 40 tablet 0  . INLYTA 5 MG tablet TAKE 1 TABLET (5 MG TOTAL) BY MOUTH 2 (TWO) TIMES DAILY. 60 tablet 0  . magic mouthwash w/lidocaine SOLN Take 5 mLs by mouth 4 (four) times daily as needed for mouth pain. 240 mL 0  . metoprolol (LOPRESSOR) 50 MG tablet Take 50 mg by mouth 2 (two) times daily.      Marland Kitchen oxyCODONE (OXY IR/ROXICODONE) 5 MG immediate release tablet Take 1 tablet (5 mg total) by mouth every 4 (four) hours as needed for severe pain. 30 tablet 0  . traMADol (ULTRAM) 50 MG tablet Take 1 tablet (50 mg total) by mouth every 6 (six) hours as needed. 30 tablet 1   No current facility-administered medications for this visit.      Allergies:  Allergies  Allergen Reactions  . Advil [Ibuprofen] Other (See Comments)    "Gives me the shakes"  . Prilosec [Omeprazole] Other (See Comments)    Constipation  Past Medical History, Surgical history, Social history, and Family History were reviewed and updated.  Review of Systems:    Physical Exam:   Blood pressure (!) 151/96, pulse 88, temperature 98.2 F (36.8 C), temperature source Oral, resp. rate 18, height 5\' 2"  (1.575 m), weight 167 lb 8 oz (76 kg), SpO2 98 %.      ECOG:  2    General appearance: Comfortable appearing without any discomfort.  Chronically ill-appearing. Head: Normocephalic without any  trauma Oropharynx: Mucous membranes are moist and pink without any thrush or ulcers. Eyes: Pupils are equal and round reactive to light. Lymph nodes: No cervical, supraclavicular, inguinal or axillary lymphadenopathy.   Heart:regular rate and rhythm.  S1 and S2 with bilateral lower extremity edema. Lung: Clear without any rhonchi or wheezes.  No dullness to percussion. Abdomin: Soft, nontender, nondistended with good bowel sounds.  No hepatosplenomegaly. Musculoskeletal: No joint deformity or effusion.  Full range of motion noted. Neurological: No deficits noted on motor, sensory and deep tendon reflex exam. Skin: No petechial rash or dryness.  Appeared moist.           Lab Results: Lab Results  Component Value Date   WBC 4.0 01/14/2019   HGB 12.5 01/14/2019   HCT 40.0 01/14/2019   MCV 96.4 01/14/2019   PLT 256 01/14/2019     Chemistry      Component Value Date/Time   NA 142 01/14/2019 0811   NA 144 11/30/2011 0418   K 4.1 01/14/2019 0811   K 3.6 11/30/2011 0418   CL 104 01/14/2019 0811   CL 105 11/30/2011 0418   CO2 28 01/14/2019 0811   CO2 29 11/30/2011 0418   BUN 9 01/14/2019 0811   BUN 19 (H) 11/30/2011 0418   CREATININE 0.69 01/14/2019 0811   CREATININE 0.98 11/30/2011 0418      Component Value Date/Time   CALCIUM 9.6 01/14/2019 0811   CALCIUM 8.6 11/30/2011 0418   ALKPHOS 94 01/14/2019 0811   ALKPHOS 70 11/28/2011 2152   AST 16 01/14/2019 0811   ALT 6 01/14/2019 0811   ALT 23 11/28/2011 2152   BILITOT 0.3 01/14/2019 0811      Impression and Plan:  78 year old woman with:  1.  Renal cell carcinoma diagnosed in October 2019.  She was found to have stage IV at the time with bone and pulmonary involvement.   She is currently on axitinib which she has tolerated reasonably well and imaging studies from February 20 of 2020 showed overall stable disease without any progression.  Risks and benefits of continuing this therapy was discussed today.   Alternative therapy which include PDL 1 inhibitor such as Pembrolizumab was also discussed.  At this time I recommended continuing this medication repeat imaging studies in 1 month.    2.  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  Status post radiation therapy in January 2020.  3.  Prognosis and goals of care: Therapy remains palliative given her disease is incurable.  For the time being he desires aggressive measures.  4.  Pain: Continues to be an issue at this time despite oxycodone as needed.  I will start her on long-acting morphine 50 mg twice a day with breakthrough oxycodone.  Instructions how to use the combination of these medication was discussed today with the patient and her daughter.  5.  Lower extremity edema: Manageable with Lasix.  Not dramatically changed.  6.  Anemia: Resolved with hemoglobin back to normal.  7.  Poor nutrition: Prescription for Megace was given to her to boost her appetite.  We have discussed strategies to boost her nutritional intake as well including nutritional supplements such as Boost, Ensure and others.  8.  Follow-up: We will be in 4 weeks to follow her progress.  25  minutes was spent with the patient face-to-face today.  More than 50% of time was dedicated to reviewing her disease status, addressing issues and complaints related to her cancer and cancer treatment as well as addressing questions regarding future plan of care.   Zola Button, MD 3/27/202012:47 PM

## 2019-02-19 NOTE — Telephone Encounter (Signed)
Spoke with [name] , caregiver. Per dr Alen Blew, patient's potassium is low.  k-dur called to patient's pharmacy.

## 2019-02-20 DIAGNOSIS — R269 Unspecified abnormalities of gait and mobility: Secondary | ICD-10-CM | POA: Diagnosis not present

## 2019-02-20 DIAGNOSIS — C7951 Secondary malignant neoplasm of bone: Secondary | ICD-10-CM | POA: Diagnosis not present

## 2019-02-20 DIAGNOSIS — I509 Heart failure, unspecified: Secondary | ICD-10-CM | POA: Diagnosis not present

## 2019-02-20 DIAGNOSIS — N2889 Other specified disorders of kidney and ureter: Secondary | ICD-10-CM | POA: Diagnosis not present

## 2019-02-22 ENCOUNTER — Other Ambulatory Visit: Payer: Self-pay | Admitting: Radiation Therapy

## 2019-02-22 DIAGNOSIS — C7951 Secondary malignant neoplasm of bone: Secondary | ICD-10-CM

## 2019-03-08 ENCOUNTER — Other Ambulatory Visit: Payer: Self-pay | Admitting: *Deleted

## 2019-03-08 ENCOUNTER — Telehealth: Payer: Self-pay | Admitting: *Deleted

## 2019-03-08 DIAGNOSIS — C649 Malignant neoplasm of unspecified kidney, except renal pelvis: Secondary | ICD-10-CM

## 2019-03-08 NOTE — Telephone Encounter (Signed)
Patients daughter in law [name] called.  Very concerned about patient.  She states that since Thursday of last week patient has stopped eating due to vomiting.  She states everything she tries to eat comes back.  Reports light brown foamy vomit, concerned for aspiration.  Will not reposition or move on her own accord.  She cries out in generalized pain and localized pain to right posterior (kidney) area.  Has become delusional per daughter in law.  Also reports maybe on intaking 1/2 bottle of water in a full day.    They have not medication on hand for nausea.  She is still getting down her pain medication morphine & Oxycodone but reports that combination isn't working at all.  Daughter is concerned and doesn't know what to do.  She stated "I know she is dying".  Return call to Daughter in [place] needed [phone].

## 2019-03-08 NOTE — Telephone Encounter (Signed)
Spoke with daughter [name] and she and her husband would like to proceed with Hospice evaluation and referral.  They declined taking her to ED due to Covid restrictions and really want to be apart of her final care.     Faxed referral to [facility].

## 2019-03-08 NOTE — Telephone Encounter (Signed)
I would recommend hospice referral and stopping her anicancer medication. If she is unwilling, she needs to go to the ED.

## 2019-03-12 DIAGNOSIS — C7951 Secondary malignant neoplasm of bone: Secondary | ICD-10-CM | POA: Diagnosis not present

## 2019-03-12 DIAGNOSIS — N2889 Other specified disorders of kidney and ureter: Secondary | ICD-10-CM | POA: Diagnosis not present

## 2019-03-12 DIAGNOSIS — I509 Heart failure, unspecified: Secondary | ICD-10-CM | POA: Diagnosis not present

## 2019-03-19 ENCOUNTER — Ambulatory Visit (HOSPITAL_COMMUNITY): Admission: RE | Admit: 2019-03-19 | Payer: Medicare Other | Source: Ambulatory Visit

## 2019-03-19 ENCOUNTER — Other Ambulatory Visit: Payer: Medicare Other

## 2019-03-22 ENCOUNTER — Ambulatory Visit (HOSPITAL_COMMUNITY): Payer: Medicare Other

## 2019-03-22 ENCOUNTER — Ambulatory Visit: Payer: Medicare Other | Admitting: Oncology

## 2019-03-24 ENCOUNTER — Ambulatory Visit: Payer: Self-pay | Admitting: Radiation Oncology

## 2019-03-26 DEATH — deceased

## 2019-08-09 IMAGING — US US THYROID
1 series · 13 of 25 positions shown · non-contrast
Comparison: None.

CLINICAL DATA: Palpable abnormality.  Thyroid mass.

EXAM:
THYROID ULTRASOUND
TECHNIQUE: Ultrasound examination of the thyroid gland and adjacent soft
tissues was performed.

[Series 1: us thyroid · 0.07mm/px · 13 of 61 slices shown]
[im 1/61]
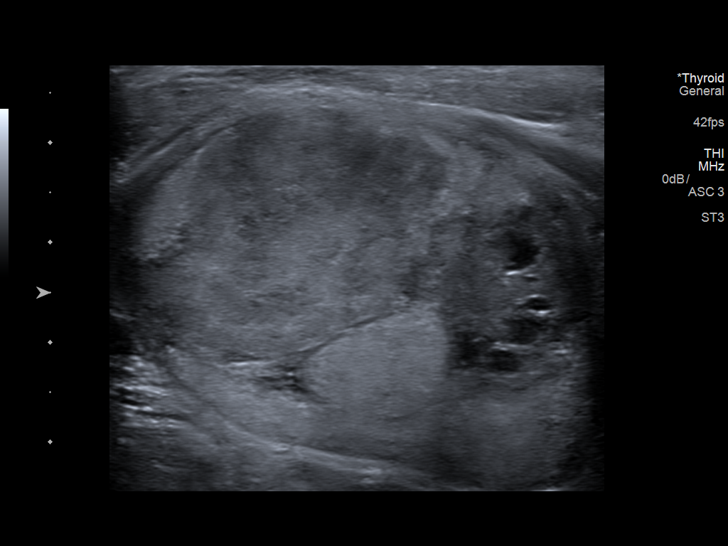
[im 6/61]
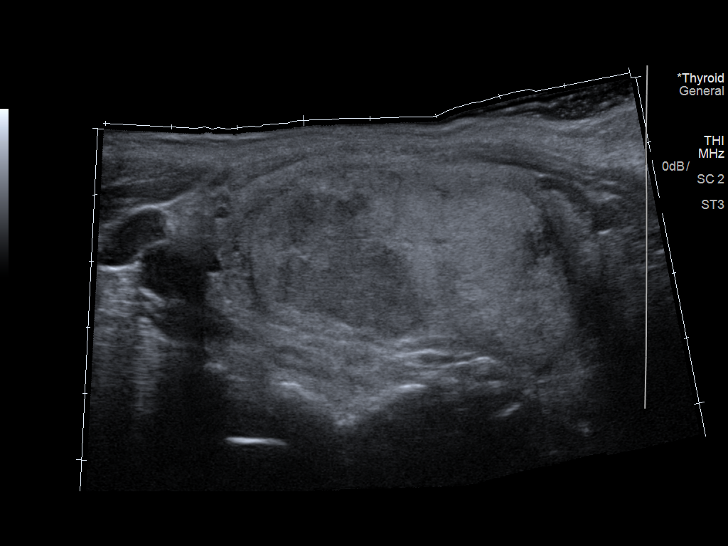
[im 11/61]
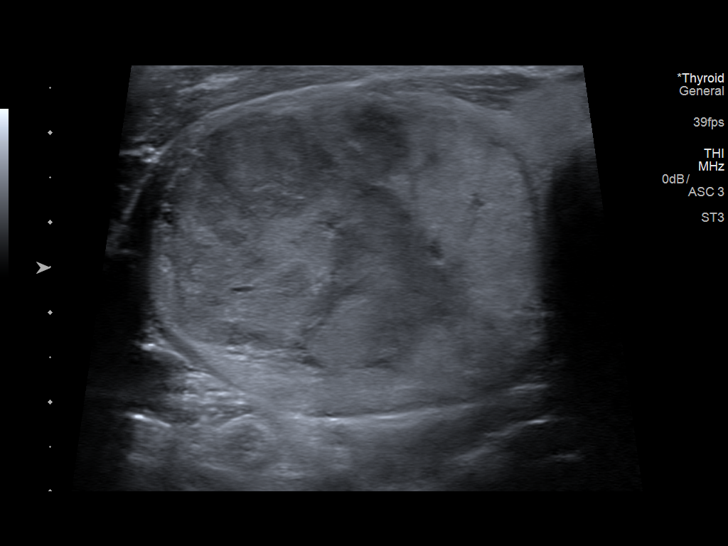
[im 16/61]
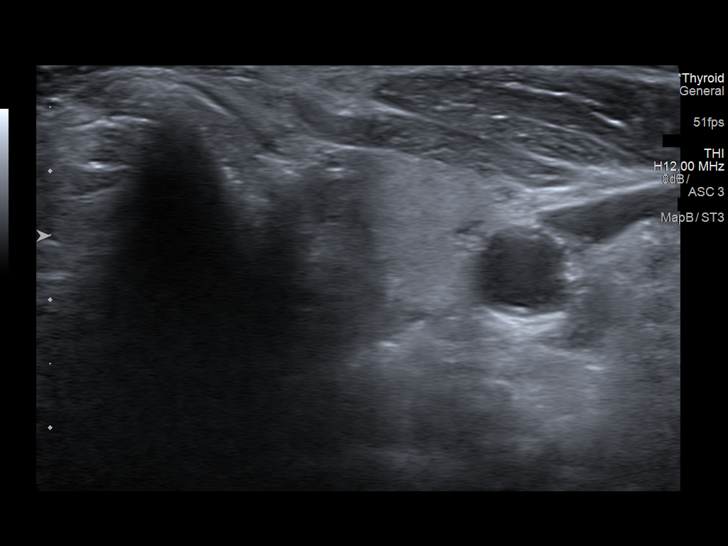
[im 21/61]
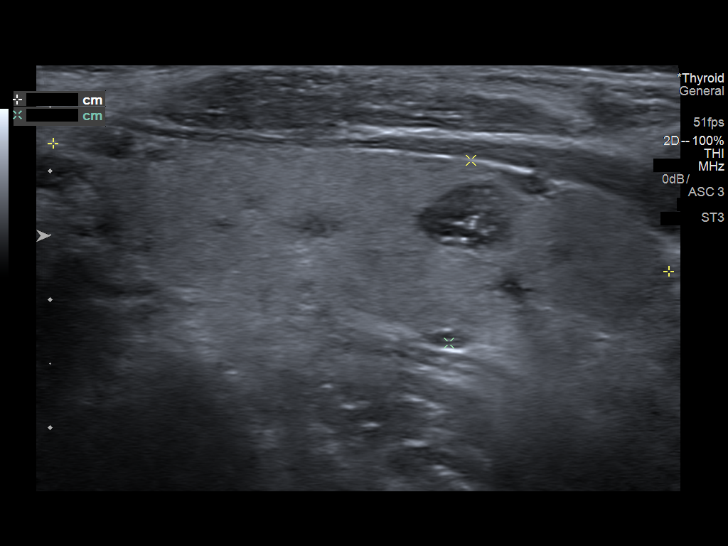
[im 26/61]
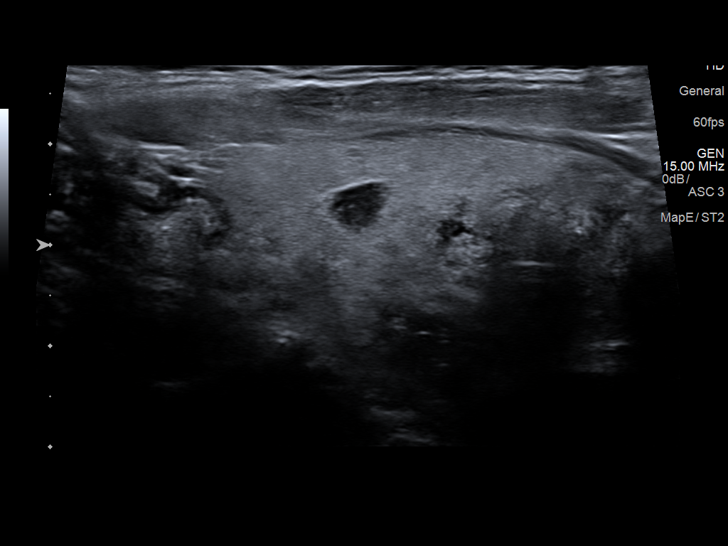
[im 31/61]
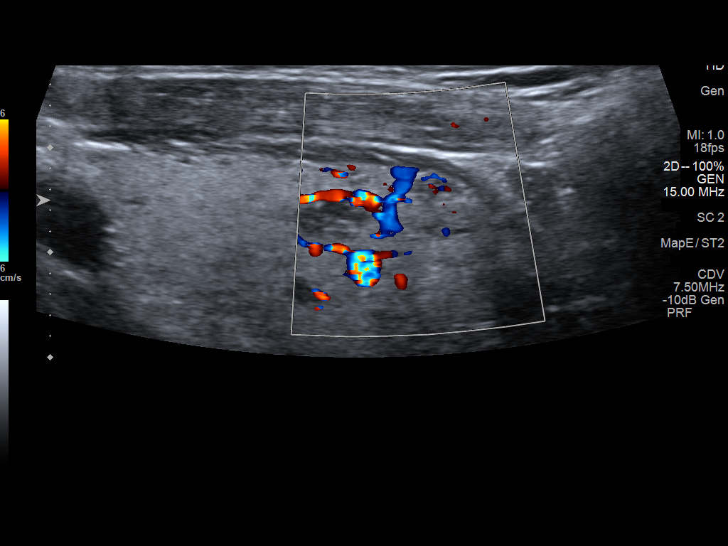
[im 36/61]
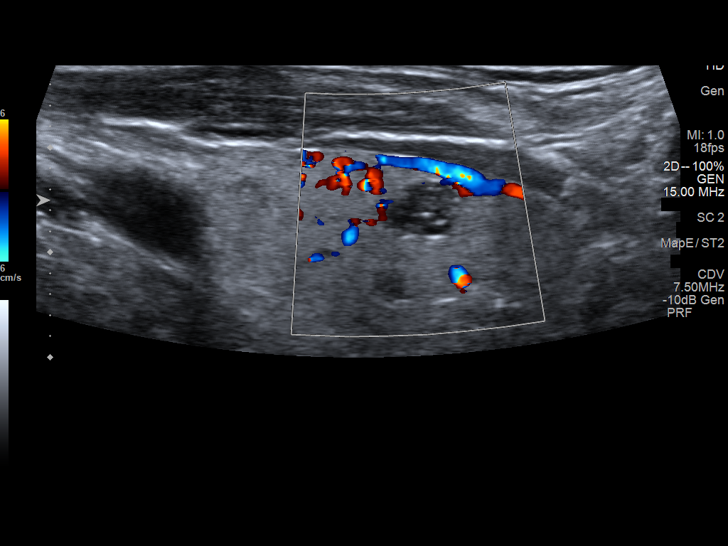
[im 41/61]
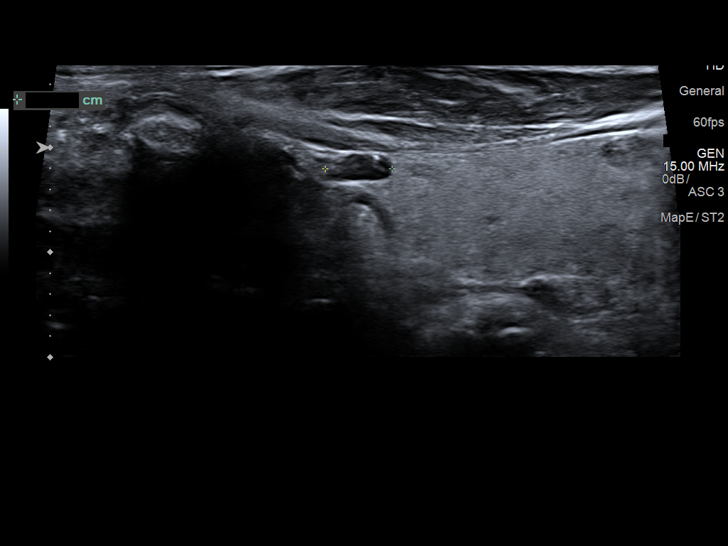
[im 46/61]
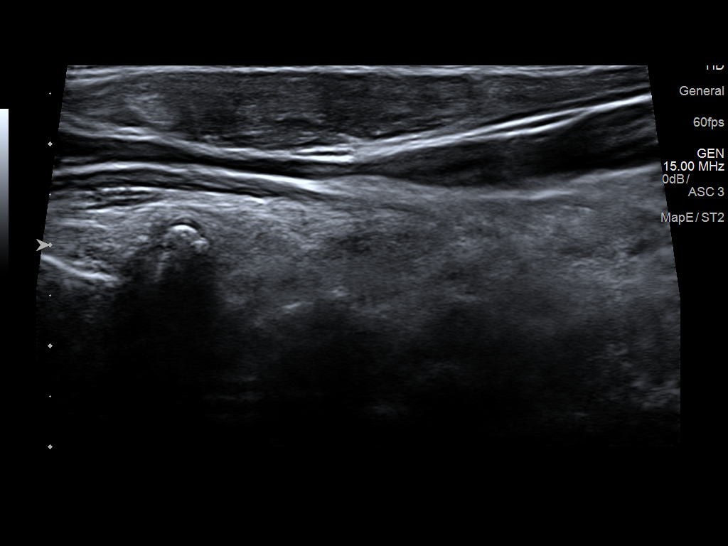
[im 51/61]
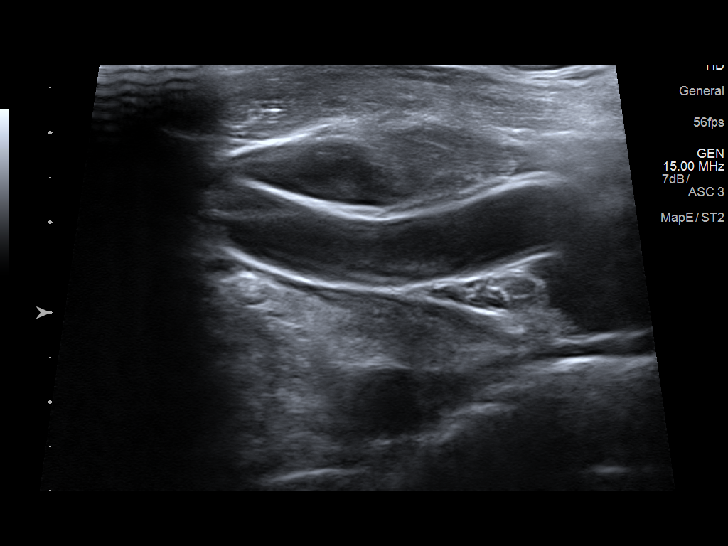
[im 56/61]
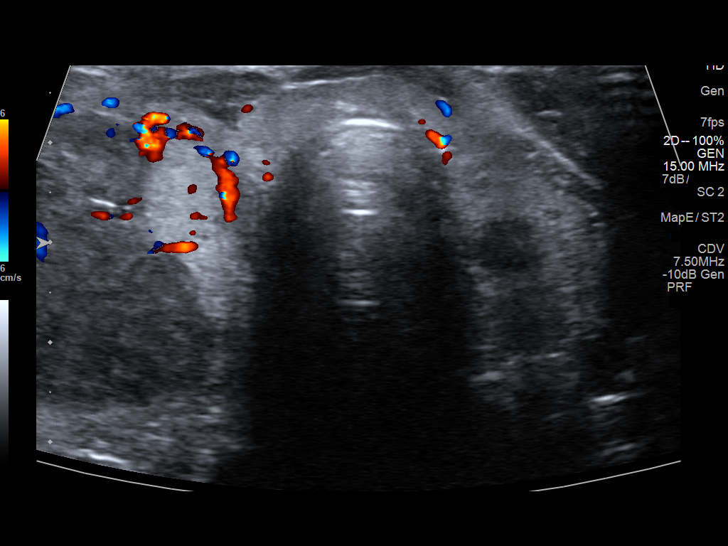
[im 61/61]
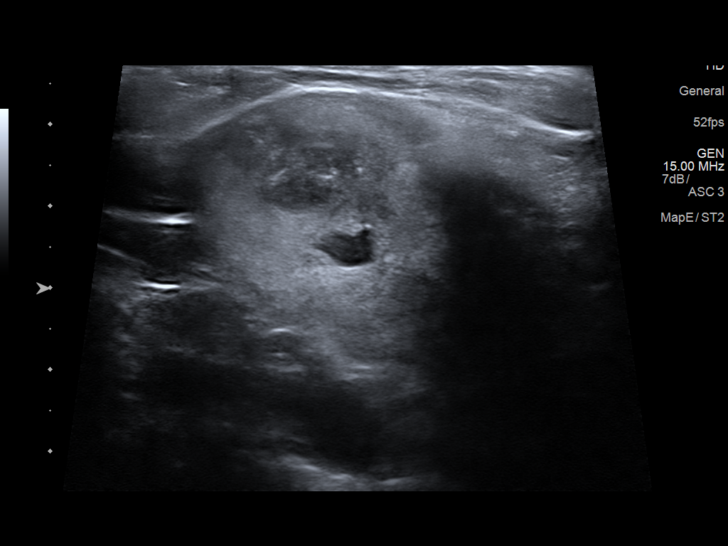

[13 of 25 positions shown; findings below may reference images not displayed]

FINDINGS: Parenchymal Echotexture: Markedly heterogenous

Isthmus: 0.4 cm

Right lobe: 7.4 x 3.5 x 4.8 cm

Left lobe: 5.1 x 1.5 x 2.1 cm

_________________________________________________________

Estimated total number of nodules >/= 1 cm: 2

Number of spongiform nodules >/=  2 cm not described below (TR1): 0

Number of mixed cystic and solid nodules >/= 1.5 cm not described
below (TR2): 0

_________________________________________________________

Nodule # 1:

Location: Right; Mid

Maximum size: 5.3 cm; Other 2 dimensions: 4.4 x 3.6 cm

Composition: solid/almost completely solid (2)

Echogenicity: hypoechoic (2)

Shape: not taller-than-wide (0)

Margins: smooth (0)

Echogenic foci: none (0)

ACR TI-RADS total points: 4.

ACR TI-RADS risk category: TR4 (4-6 points).

ACR TI-RADS recommendations:

**Given size (>/= 1.5 cm) and appearance, fine needle aspiration of
this moderately suspicious nodule should be considered based on
TI-RADS criteria.

_________________________________________________________

Nodule # 2:

Location: Left; Inferior

Maximum size: 1.1 cm; Other 2 dimensions: 1.0 x 0.6 cm

Composition: solid/almost completely solid (2)

Echogenicity: isoechoic (1)

Shape: not taller-than-wide (0)

Margins: smooth (0)

Echogenic foci: none (0)

ACR TI-RADS total points: 3.

ACR TI-RADS risk category: TR3 (3 points).

ACR TI-RADS recommendations:

Given size (<1.4 cm) and appearance, this nodule does NOT meet
TI-RADS criteria for biopsy or dedicated follow-up.

_________________________________________________________

Other nodules measure 0.9 cm or less in size and do not meet
criteria for biopsy nor follow-up.
IMPRESSION: Right nodule 1 meets criteria for fine needle aspiration biopsy.

Other nodules do not meet criteria for biopsy nor follow-up.

The above is in keeping with the ACR TI-RADS recommendations - [HOSPITAL] 3631;[DATE].

## 2019-09-27 IMAGING — CT CT ABD-PELV W/ CM
2 of 5 series · 13 of 36 positions shown, 16 images · IV contrast (omnipaque)
Comparison: MRI cervical spine 08/30/2018.

CLINICAL DATA: Pathologic cervical spine fracture. Assessing for
primary malignancy

EXAM:
CT CHEST, ABDOMEN, AND PELVIS WITH CONTRAST
TECHNIQUE: Multidetector CT imaging of the chest, abdomen and pelvis was
performed following the standard protocol during bolus
administration of intravenous contrast.
CONTRAST:  100mL OMNIPAQUE IOHEXOL 300 MG/ML  SOLN

[Series 3: cap 5.0 i31f 2 · axial · 0.94mm/px · z∈[-767,-247]mm · 10 of 128 slices shown, 13 images]
[im 12/128  mediastinal]
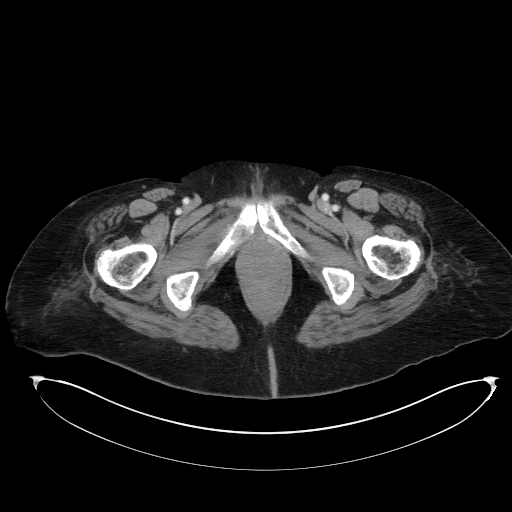
[im 12/128  lung]
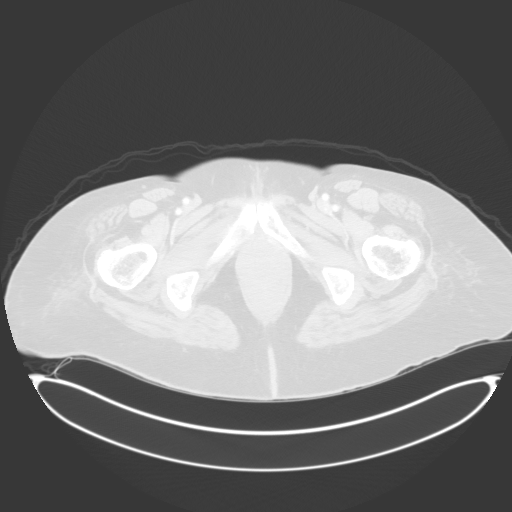
[im 24/128  lung]
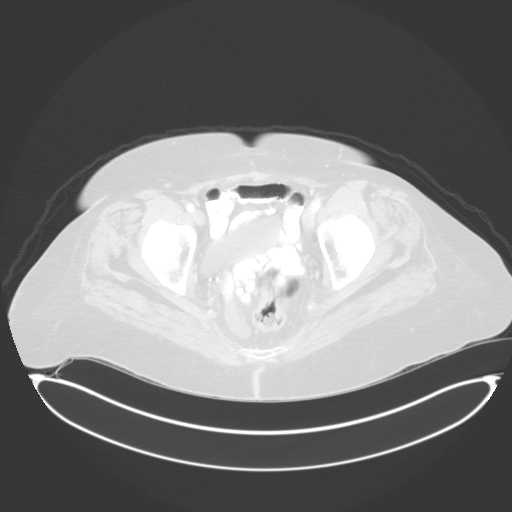
[im 35/128  lung]
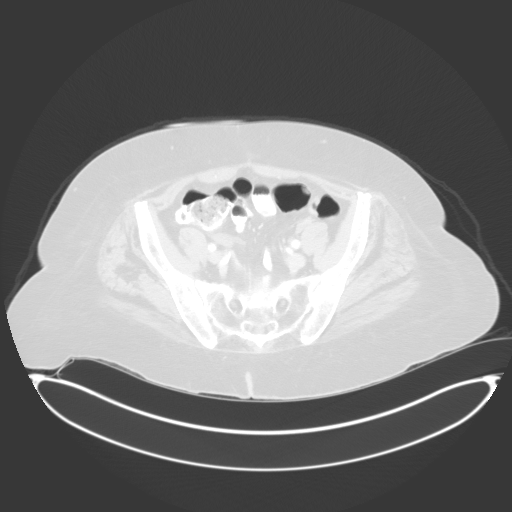
[im 47/128  lung]
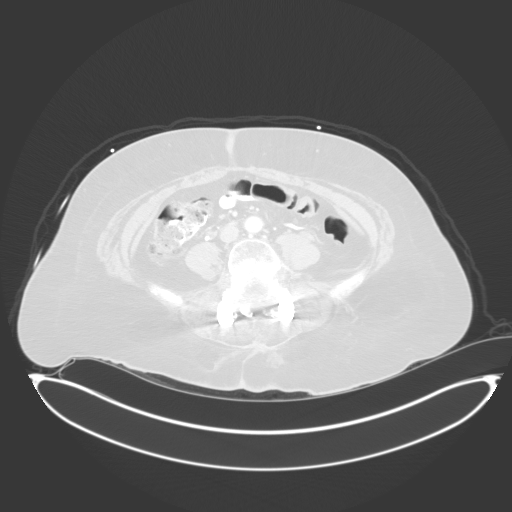
[im 58/128  mediastinal]
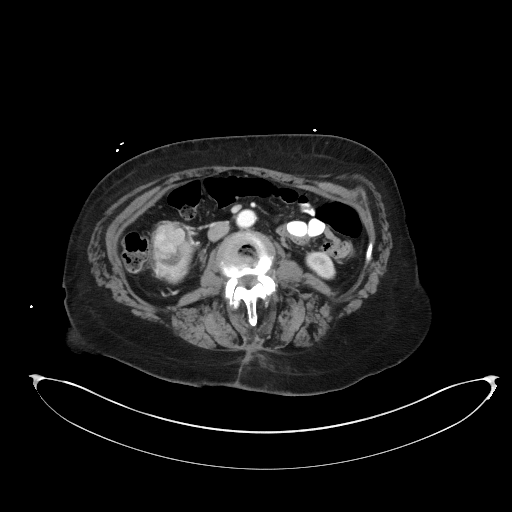
[im 58/128  lung]
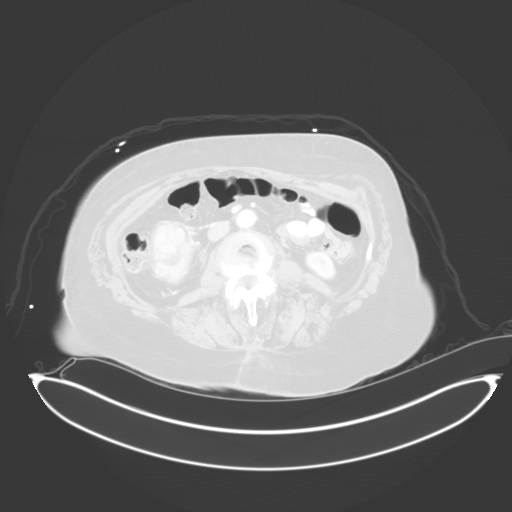
[im 70/128  lung]
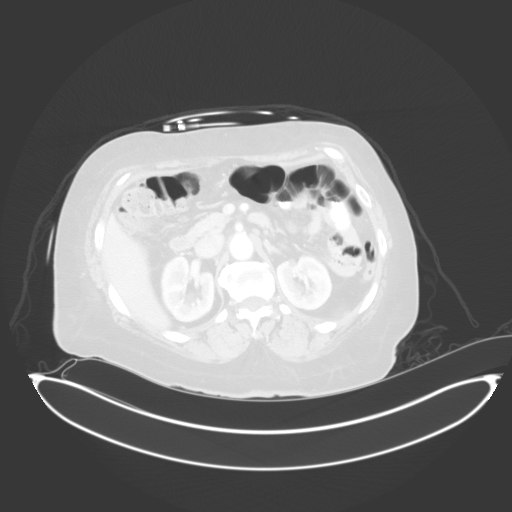
[im 81/128  lung]
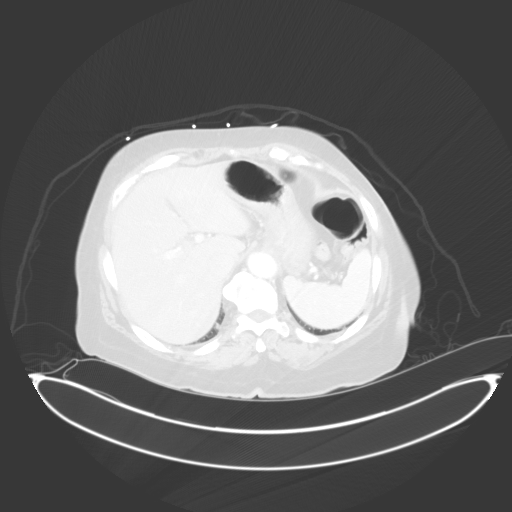
[im 93/128  lung]
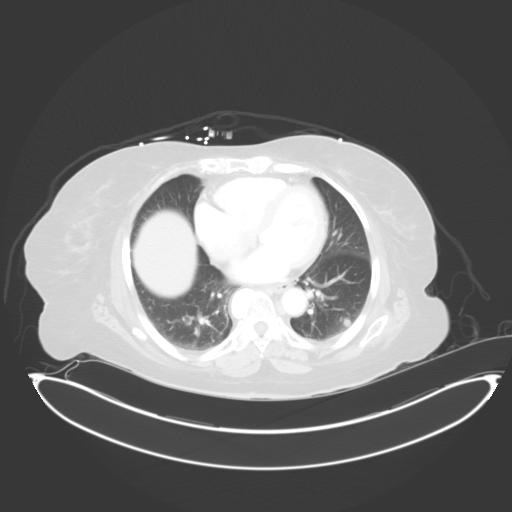
[im 104/128  mediastinal]
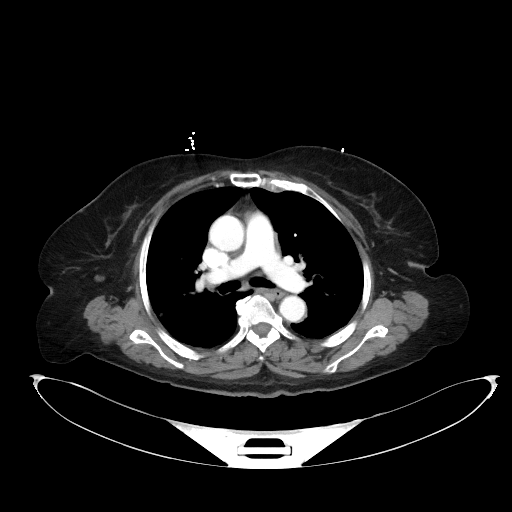
[im 104/128  lung]
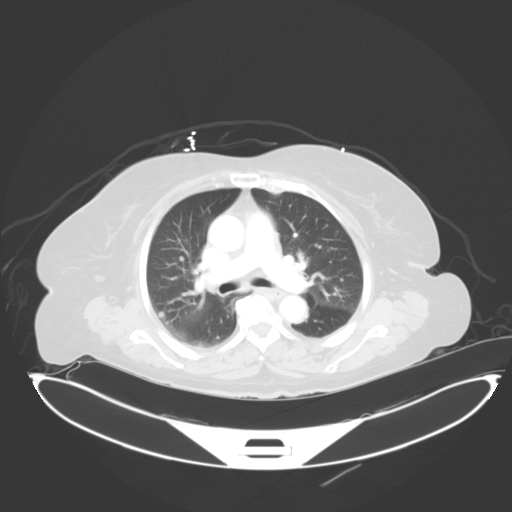
[im 116/128  lung]
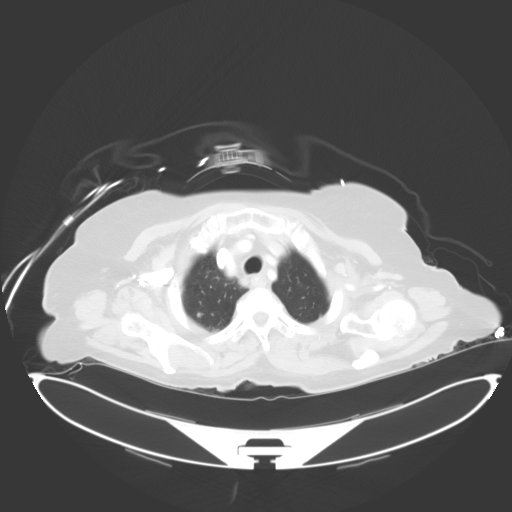

[Series 7: coronal · coronal · 0.83mm/px · 3 of 151 slices shown]
[im 31/151  lung]
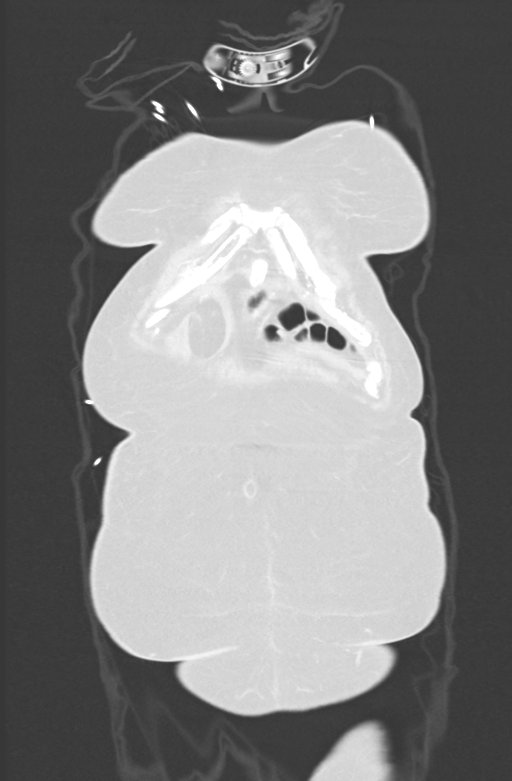
[im 61/151  lung]
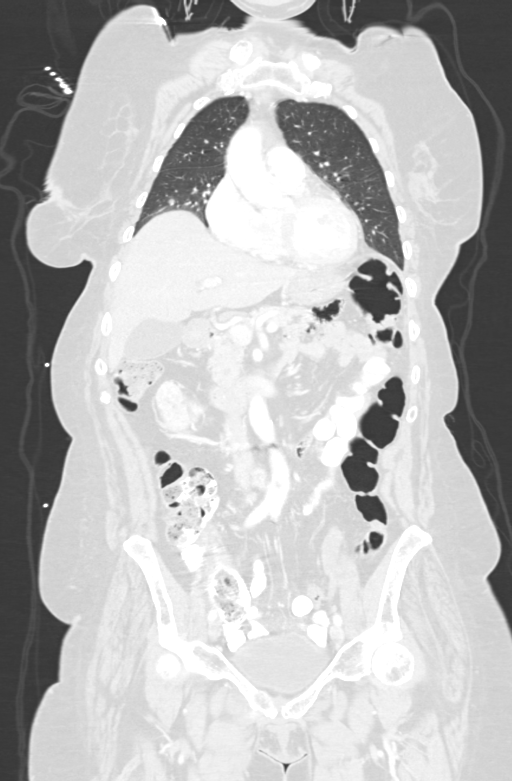
[im 91/151  lung]
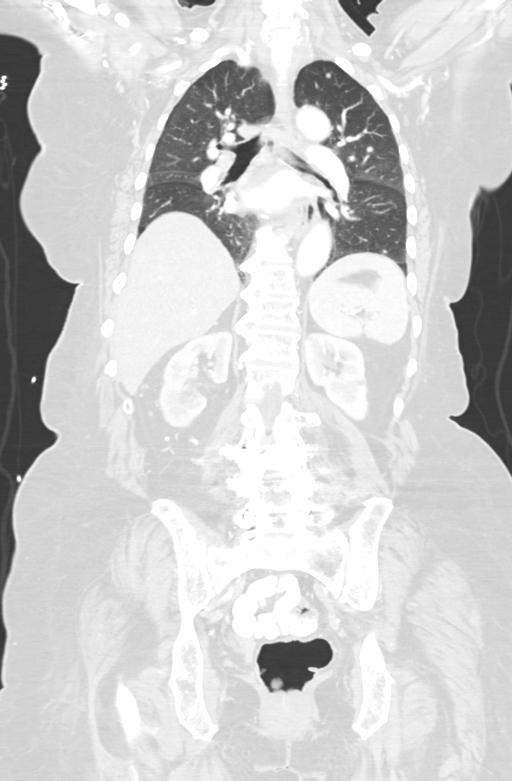

[13 of 36 positions shown; findings below may reference images not displayed]

FINDINGS: CT CHEST FINDINGS

Cardiovascular: Heart is normal size. Scattered aortic arch
calcifications. Aorta is normal caliber. No dissection.

Mediastinum/Nodes: No mediastinal, hilar, or axillary adenopathy.
Enlarged, heterogeneous appearance of the right thyroid lobe, likely
replaced by large mass measuring 4.6 x 4.1 cm on axial imaging.
Small nodule in the left thyroid lobe.

Lungs/Pleura: Numerous bilateral pulmonary nodules most compatible
with metastases. Index lingular nodule measures 11 mm on image 74.
Index left lower lobe nodule measures 7 mm on image 82. Index right
lower lobe nodule measures 7 mm on image 77. Numerous other
similarly sized nodules. No effusions.

Musculoskeletal: Pathologic fracture again seen at the C7 level. No
additional focal bone lesion.

CT ABDOMEN PELVIS FINDINGS

Hepatobiliary: No focal hepatic abnormality. Gallbladder
unremarkable.

Pancreas: No focal abnormality or ductal dilatation.

Spleen: No focal abnormality.  Normal size.

Adrenals/Urinary Tract: Large enhancing mass within the lower pole
of the right kidney measures 6.5 x 5.3 cm most compatible with renal
cell carcinoma. Small cysts in the left kidney. No hydronephrosis.
Urinary bladder unremarkable.

Stomach/Bowel: Stomach, large and small bowel grossly unremarkable.

Vascular/Lymphatic: Aortic atherosclerosis. No enlarged abdominal or
pelvic lymph nodes.

Reproductive: Prior hysterectomy.  No adnexal masses.

Other: Trace free fluid in the pelvis.  No free air.

Musculoskeletal: Postoperative changes in the lower lumbar spine
posterior fusion. Destructive lytic lesion noted in the medial right
iliac bone measuring up to 3.1 cm. Probable early lesion with early
cortical scalloping noted in the left iliac bone, measuring 2.1 cm.
Mixed lytic and sclerotic lesion noted more inferiorly in the left
iliac bone adjacent to the SI joint.
IMPRESSION: Large enhancing mixed solid and cystic mass within the mid to lower
pole of the right kidney compatible with renal cell carcinoma. This
measures up to 6.5 cm. Numerous pulmonary nodules within both lungs
compatible with pulmonary metastases.

Lytic destructive lesion within the right medial iliac crest with
probable early lesion in a similar position in the left iliac bone.
Mixed lytic and sclerotic lesion more inferiorly within the left
iliac bone. Findings compatible with bone metastases.

Right thyroid nodule/mass measuring up to 4.6 cm. This may reflect
multinodular goiter. This could be further characterized with
thyroid ultrasound.

Trace free fluid in the pelvis.
